# Patient Record
Sex: Female | Born: 1935 | Race: Black or African American | Hispanic: No | Smoking: Current every day smoker
Health system: Southern US, Community
[De-identification: ages and names within clinical notes are randomized; demographics above are authoritative.]

## PROBLEM LIST (undated history)

## (undated) DIAGNOSIS — N182 Chronic kidney disease, stage 2 (mild): Secondary | ICD-10-CM

## (undated) DIAGNOSIS — Z9889 Other specified postprocedural states: Secondary | ICD-10-CM

## (undated) DIAGNOSIS — E041 Nontoxic single thyroid nodule: Secondary | ICD-10-CM

## (undated) DIAGNOSIS — Z720 Tobacco use: Secondary | ICD-10-CM

## (undated) DIAGNOSIS — M1990 Unspecified osteoarthritis, unspecified site: Secondary | ICD-10-CM

## (undated) DIAGNOSIS — R911 Solitary pulmonary nodule: Secondary | ICD-10-CM

## (undated) DIAGNOSIS — I471 Supraventricular tachycardia, unspecified: Secondary | ICD-10-CM

## (undated) DIAGNOSIS — K219 Gastro-esophageal reflux disease without esophagitis: Secondary | ICD-10-CM

## (undated) DIAGNOSIS — Z950 Presence of cardiac pacemaker: Secondary | ICD-10-CM

## (undated) DIAGNOSIS — E785 Hyperlipidemia, unspecified: Secondary | ICD-10-CM

## (undated) DIAGNOSIS — M359 Systemic involvement of connective tissue, unspecified: Secondary | ICD-10-CM

## (undated) DIAGNOSIS — I119 Hypertensive heart disease without heart failure: Secondary | ICD-10-CM

## (undated) DIAGNOSIS — M069 Rheumatoid arthritis, unspecified: Secondary | ICD-10-CM

## (undated) DIAGNOSIS — J449 Chronic obstructive pulmonary disease, unspecified: Secondary | ICD-10-CM

## (undated) DIAGNOSIS — I5042 Chronic combined systolic (congestive) and diastolic (congestive) heart failure: Secondary | ICD-10-CM

## (undated) DIAGNOSIS — I10 Essential (primary) hypertension: Secondary | ICD-10-CM

## (undated) DIAGNOSIS — F419 Anxiety disorder, unspecified: Secondary | ICD-10-CM

## (undated) DIAGNOSIS — E790 Hyperuricemia without signs of inflammatory arthritis and tophaceous disease: Secondary | ICD-10-CM

## (undated) HISTORY — PX: TUBAL LIGATION: SHX77

## (undated) HISTORY — DX: Chronic obstructive pulmonary disease, unspecified: J44.9

## (undated) HISTORY — DX: Chronic combined systolic (congestive) and diastolic (congestive) heart failure: I50.42

## (undated) HISTORY — DX: Tobacco use: Z72.0

## (undated) HISTORY — DX: Hypertensive heart disease without heart failure: I11.9

## (undated) HISTORY — DX: Unspecified osteoarthritis, unspecified site: M19.90

## (undated) HISTORY — DX: Supraventricular tachycardia, unspecified: I47.10

## (undated) HISTORY — DX: Anxiety disorder, unspecified: F41.9

## (undated) HISTORY — DX: Hyperuricemia without signs of inflammatory arthritis and tophaceous disease: E79.0

## (undated) HISTORY — DX: Gastro-esophageal reflux disease without esophagitis: K21.9

## (undated) HISTORY — PX: OTHER SURGICAL HISTORY: SHX169

## (undated) HISTORY — DX: Hyperlipidemia, unspecified: E78.5

## (undated) HISTORY — DX: Solitary pulmonary nodule: R91.1

## (undated) HISTORY — DX: Supraventricular tachycardia: I47.1

## (undated) HISTORY — DX: Other specified postprocedural states: Z98.890

## (undated) HISTORY — DX: Rheumatoid arthritis, unspecified: M06.9

## (undated) HISTORY — DX: Chronic kidney disease, stage 2 (mild): N18.2

## (undated) HISTORY — DX: Nontoxic single thyroid nodule: E04.1

## (undated) HISTORY — DX: Essential (primary) hypertension: I10

---

## 2004-07-30 ENCOUNTER — Emergency Department: Payer: Self-pay | Admitting: Emergency Medicine

## 2004-11-10 ENCOUNTER — Emergency Department: Payer: Self-pay | Admitting: Emergency Medicine

## 2004-11-10 ENCOUNTER — Other Ambulatory Visit: Payer: Self-pay

## 2005-04-10 ENCOUNTER — Emergency Department: Payer: Self-pay | Admitting: Emergency Medicine

## 2005-08-20 ENCOUNTER — Emergency Department: Payer: Self-pay | Admitting: Internal Medicine

## 2005-10-02 ENCOUNTER — Emergency Department: Payer: Self-pay | Admitting: Emergency Medicine

## 2005-10-02 ENCOUNTER — Other Ambulatory Visit: Payer: Self-pay

## 2006-03-12 ENCOUNTER — Emergency Department: Payer: Self-pay | Admitting: Emergency Medicine

## 2006-03-24 ENCOUNTER — Emergency Department: Payer: Self-pay | Admitting: Emergency Medicine

## 2006-09-22 HISTORY — PX: CARDIAC CATHETERIZATION: SHX172

## 2006-10-06 ENCOUNTER — Inpatient Hospital Stay: Payer: Self-pay | Admitting: Internal Medicine

## 2006-10-06 ENCOUNTER — Other Ambulatory Visit: Payer: Self-pay

## 2006-10-06 ENCOUNTER — Ambulatory Visit: Payer: Self-pay | Admitting: Cardiology

## 2006-10-16 ENCOUNTER — Ambulatory Visit: Payer: Self-pay | Admitting: Internal Medicine

## 2006-11-11 ENCOUNTER — Ambulatory Visit: Payer: Self-pay | Admitting: Internal Medicine

## 2006-12-02 ENCOUNTER — Ambulatory Visit: Payer: Self-pay | Admitting: Gastroenterology

## 2007-01-19 ENCOUNTER — Ambulatory Visit: Payer: Self-pay | Admitting: Internal Medicine

## 2007-03-05 ENCOUNTER — Emergency Department: Payer: Self-pay

## 2007-04-07 ENCOUNTER — Ambulatory Visit: Payer: Self-pay

## 2007-04-07 ENCOUNTER — Encounter: Payer: Self-pay | Admitting: Internal Medicine

## 2007-04-12 ENCOUNTER — Ambulatory Visit: Payer: Self-pay | Admitting: Internal Medicine

## 2007-04-22 ENCOUNTER — Ambulatory Visit: Payer: Self-pay | Admitting: Internal Medicine

## 2007-06-17 ENCOUNTER — Ambulatory Visit: Payer: Self-pay | Admitting: Internal Medicine

## 2007-08-10 ENCOUNTER — Emergency Department: Payer: Self-pay | Admitting: Emergency Medicine

## 2007-10-25 ENCOUNTER — Emergency Department: Payer: Self-pay | Admitting: Emergency Medicine

## 2007-11-01 ENCOUNTER — Ambulatory Visit: Payer: Self-pay | Admitting: Internal Medicine

## 2007-12-02 ENCOUNTER — Ambulatory Visit: Payer: Self-pay | Admitting: Internal Medicine

## 2007-12-20 ENCOUNTER — Ambulatory Visit: Payer: Self-pay | Admitting: Internal Medicine

## 2007-12-20 LAB — CONVERTED CEMR LAB
BUN: 16 mg/dL (ref 6–23)
Potassium: 3.3 meq/L — ABNORMAL LOW (ref 3.5–5.3)
Sodium: 146 meq/L — ABNORMAL HIGH (ref 135–145)

## 2007-12-24 ENCOUNTER — Ambulatory Visit: Payer: Self-pay | Admitting: Internal Medicine

## 2008-02-01 ENCOUNTER — Emergency Department: Payer: Self-pay | Admitting: Internal Medicine

## 2008-08-28 ENCOUNTER — Emergency Department: Payer: Self-pay | Admitting: Emergency Medicine

## 2008-12-13 ENCOUNTER — Emergency Department: Payer: Self-pay | Admitting: Emergency Medicine

## 2009-03-28 ENCOUNTER — Emergency Department: Payer: Self-pay | Admitting: Emergency Medicine

## 2009-08-27 ENCOUNTER — Emergency Department: Payer: Self-pay | Admitting: Emergency Medicine

## 2009-09-18 ENCOUNTER — Inpatient Hospital Stay: Payer: Self-pay | Admitting: Vascular Surgery

## 2009-12-07 ENCOUNTER — Emergency Department: Payer: Self-pay | Admitting: Emergency Medicine

## 2010-01-20 ENCOUNTER — Emergency Department: Payer: Self-pay | Admitting: Emergency Medicine

## 2010-02-15 ENCOUNTER — Emergency Department: Payer: Self-pay | Admitting: Emergency Medicine

## 2010-04-14 ENCOUNTER — Emergency Department: Payer: Self-pay | Admitting: Unknown Physician Specialty

## 2010-06-20 ENCOUNTER — Ambulatory Visit: Payer: Self-pay | Admitting: Internal Medicine

## 2010-06-20 DIAGNOSIS — F17200 Nicotine dependence, unspecified, uncomplicated: Secondary | ICD-10-CM | POA: Insufficient documentation

## 2010-06-20 DIAGNOSIS — R000 Tachycardia, unspecified: Secondary | ICD-10-CM | POA: Insufficient documentation

## 2010-06-21 ENCOUNTER — Encounter: Payer: Self-pay | Admitting: Internal Medicine

## 2010-06-25 ENCOUNTER — Encounter: Payer: Self-pay | Admitting: Internal Medicine

## 2010-06-25 ENCOUNTER — Ambulatory Visit: Payer: Self-pay

## 2010-10-22 NOTE — Assessment & Plan Note (Signed)
Summary: ROV/AMD  Medications Added ALPRAZOLAM 0.25 MG TABS (ALPRAZOLAM) one tablet three times a day as needed DILTIAZEM HCL 120 MG TABS (DILTIAZEM HCL) one tablet daily FOLIC ACID 1 MG TABS (FOLIC ACID) one tablet once daily HYDROCHLOROTHIAZIDE 25 MG TABS (HYDROCHLOROTHIAZIDE) one tablet once daily METHOTREXATE 2.5 MG TABS (METHOTREXATE SODIUM) one tablet qd PREDNISONE 5 MG/5ML SOLN (PREDNISONE) once daily      Allergies Added: NKDA  Visit Type:  Initial Consult Primary Provider:  Charlotta Newton.  CC:  c/o irreg. heart beats and shortness of breath.  Denies chest pain.Marland Kitchen  History of Present Illness: Ms. [name] [name] is a very pleasant 75 year old woman with a history of chest pain and dyspnea with normal coronary arteries by catheterization in 2008.  She also has hypertension, hyperlipidemia, COPD with ongoing tobacco use, rheumatoir arthritis, borderline diabetes and gastroesophageal reflux disease.   We have not seen her in 2 years. Referred back for evaluation of irregular heartbeat and SOB.   In May went to [facility] with palpitations. Told her heart was skipping. ECG showed sinus tach. Started on diltiiazem. Says she doesn't notice palpitations. However, went to PCP a couple of weeks ago and was told her heartbeart was irregular and referred here. Had blood work but she doesn't know results or what they checked.  Continues to smoke 1/2 ppd. Remains fairly active cutting grass and doing other activities. Has occasional CP usually after eating. Not exertional. Stays SOB. No syncope or presyncope. No HF. No known h/o a. fib. No fevers or chills.     Problems Prior to Update: None  Medications Prior to Update: 1)  None  Current Medications (verified): 1)  Alprazolam 0.25 Mg Tabs (Alprazolam) .... One Tablet Three Times A Day As Needed 2)  Diltiazem Hcl 120 Mg Tabs (Diltiazem Hcl) .... One Tablet Daily 3)  Folic Acid 1 Mg Tabs (Folic Acid) .... One Tablet Once Daily 4)   Hydrochlorothiazide 25 Mg Tabs (Hydrochlorothiazide) .... One Tablet Once Daily 5)  Methotrexate 2.5 Mg Tabs (Methotrexate Sodium) .... One Tablet Qd 6)  Prednisone 5 Mg/67ml Soln (Prednisone) .... Once Daily  Allergies (verified): No Known Drug Allergies   Past History:  Past Medical History: Last updated: 08/22/2009 Hypertension Hyperlipidemia COPD with ongoing tobacco use borderline DM GERD  Family History: Last updated: 08/22/2009 +hypertension +DM no history of early CAD and CVA  Social History: Last updated: 06/20/2010 Widowed Tobacco Use - Yes.  Drugs/ETOH - no  Risk Factors: Smoking Status: current (08/22/2009)  Family History: Reviewed history from 08/22/2009 and no changes required. +hypertension +DM no history of early CAD and CVA  Social History: Reviewed history from 08/22/2009 and no changes required. Widowed Tobacco Use - Yes.  Drugs/ETOH - no  Review of Systems       As per HPI and past medical history; otherwise all systems negative.   Vital Signs:  Patient profile:   75 year old female Height:      66 inches Weight:      181 pounds BMI:     29.32 Pulse rate:   104 / minute BP sitting:   128 / 80  (left arm) Cuff size:   large  Vitals Entered By: Bishop Dublin, CMA (June 20, 2010 4:31 PM)  Physical Exam  General:  Well appearing. no resp difficulty HEENT: normal Neck: supple. no JVD. Carotids 2+ bilat; no bruits. No lymphadenopathy or thryomegaly appreciated. Cor: PMI nondisplaced. Tachycardic irregular. No rubs, gallops, murmur. Lungs: clear with decreased air movement throughout Abdomen:  soft, nontender, nondistended. No hepatosplenomegaly. No bruits or masses. Good bowel sounds. Extremities: no cyanosis, clubbing, rash, edema Neuro: alert & orientedx3, cranial nerves grossly intact. moves all 4 extremities w/o difficulty. affect pleasant    Impression & Recommendations:  Problem # 1:  TACHYCARDIA (ICD-785) She has  persistent sinus tach of unlcear etiology. Also has frequent PVCs. Will need echo to assess LV and RV function as well as to evlauate for PAH. Also check CBC and thyroid panel. Continue diltiazem.   Problem # 2:  TOBACCO ABUSE (ICD-305.1) Counseled on need to quit.   Other Orders: Echocardiogram (Echo) T-Comprehensive Metabolic Panel [phone]) T-CBC w/Diff [phone]) T-T4, Free [id number]) T-TSH [phone])  Patient Instructions: 1)  Your physician recommends that you return for lab work in:(CBC/CMET/TSH/T4FREE)  2)  Your physician wants you to follow-up in:   3 MONTHS You will receive a reminder letter in the mail two months in advance. If you don't receive a letter, please call our office to schedule the follow-up appointment. 3)  Your physician has requested that you have an echocardiogram.  Echocardiography is a painless test that uses sound waves to create images of your heart. It provides your doctor with information about the size and shape of your heart and how well your heart's chambers and valves are working.  This procedure takes approximately one hour. There are no restrictions for this procedure.

## 2011-02-04 NOTE — Assessment & Plan Note (Signed)
[facility] OFFICE NOTE   NAME:TROLLINGERKyasia, Steuck                    MRN:          [id number]  DATE:12/02/2007                            DOB:          [birth year]    PRIMARY CARE PHYSICIAN:  Dr. Darliss Cheney at [facility] in  [place].   INTERVAL HISTORY:  Ms. Dishner is a very pleasant 75 year old with  history of chest pain and dyspnea with normal coronary arteries by  catheterization 2008.  She also has hypertension, hyperlipidemia, COPD  with ongoing tobacco use, borderline diabetes and gastroesophageal  reflux disease.   She returns today for routine follow-up.  She is doing okay.  Denies any  chest pain or shortness of breath.  No lower extremity edema.  She  continues to smoke but is trying to quit.  She says she has been  following a blood pressure systolics that run around the 140 range.   CURRENT MEDICATIONS:  1. Aspirin 81 a day.  2. Omeprazole 20 a day.  3. Coreg 6.25 b.i.d.  4. Norvasc 10 a day.   PHYSICAL EXAMINATION:  GENERAL:  Well-appearing, no acute distress.  Ambulates around the clinic without respiratory difficulty.  VITAL SIGNS:  Blood pressure is 135/75, heart rate 82, weight 189.  HEENT:  Normal.  NECK:  Supple.  No JVD, carotid 2+ bilateral bruits.  There is no  lymphadenopathy or thyromegaly.  CARDIAC:  PMI is nondisplaced is regular rate and rhythm.  No murmurs.  There is an S4.  LUNGS:  Clear.  ABDOMEN:  Soft, nontender, nondistended.  No hepatosplenomegaly.  No  bruits, no masses.  Good bowel sounds.  EXTREMITIES:  Warm.  No cyanosis, clubbing or edema.  No rash.  NEUROLOGICAL:  She is alert and x3.  Cranial nerves II-XII intact.  Moves all four extremities without difficulty.  Affect is pleasant.   ASSESSMENT/PLAN:  1. Hypertension.  Blood pressure remains elevated.  Given her      diabetes, I think it is reasonable to try and reinstitute an ACE  inhibitor.  She had problems with the lisinopril, HCTZ combination      in the past.  We will try to use just lisinopril 10 mg a day.  Will      get a B-met in one week.  2. Tobacco use.  I counseled her on the need to quit smoking.  She is      interested in trying again.  She has failed Chantix in the past.      We will try her on Zyban.   DISPOSITION:  Return to clinic in 3 months routine follow-up.     [name], MD  Electronically Signed    DRB/MedQ  DD: 12/02/2007  DT: 12/03/2007  Job #: [id number]

## 2011-02-04 NOTE — Assessment & Plan Note (Signed)
[facility] OFFICE NOTE   NAME:[name] [name], [name] [name]                    MRN:          [id number]  DATE:12/24/2007                            DOB:          [birth year]    PRIMARY CARE PHYSICIAN:  Dr. Darliss Cheney at [facility] in  [place].   HISTORY:  Ms. Heldman is a very pleasant 75 year old woman with a  history of chest pain and dyspnea with normal coronary arteries by  catheterization in 2008.  She also has hypertension, hyperlipidemia,  COPD with ongoing tobacco use, borderline diabetes and gastroesophageal  reflux disease.   She returns today for routine follow-up.  She is feeling quite  miserable.  She has a viral infection and has had a very severe cough.  Her eyes have been watering and she has been congested.  When we last  saw her we increased her Norvasc from 5 mg a day to 10 mg a day, but at  the same time she stopped her lisinopril and her Coreg.  She denies any  chest pain or shortness of breath.   CURRENT MEDICATIONS:  1. Aspirin 81 mg a day.  2. Colace.  3. Prilosec 20 mg a day.  4. Potassium 10 mEq a day.   MEDICATION INTOLERANCES:  She stopped taking lisinopril and  hydrochlorothiazide, it made her feel lightheaded.  She refused to take  it.  We actually ended up putting her just back on lisinopril but she  stopped this as well.   PHYSICAL EXAM:  She is feeling miserable.  She has watery eyes and is  congested.  She feels nauseated.  Blood pressure is 132/80, heart rate 70, weight is 186.  HEENT:  Notable for watery eyes, which are injected, and sinus  congestion.  NECK:  Supple.  No JVD.  Carotids 2+ bilaterally without bruits.  There  is no lymphadenopathy or thyromegaly.  CARDIAC:  PMI is nondisplaced.  She has a regular rate and rhythm.  No  murmurs.  There is an S4.  LUNGS:  Clear.  ABDOMEN:  Soft, nontender, nondistended.  No hepatosplenomegaly, no  bruits, no masses.   Good bowel sounds.  EXTREMITIES:  Warm with no cyanosis, clubbing or edema.  No rash.  NEUROLOGIC:  Alert and oriented x3.  Cranial nerves II-XII are intact.  Moves all four extremities without difficulty.  Affect is pleasant.   ASSESSMENT/PLAN:  1. Hypertension.  Blood pressure is not too bad today.  We will ask      her to restart her Coreg 6.25 mg b.i.d.  Ideally, given her      borderline diabetes, she would benefit from an ACE inhibitor but      she has had trouble tolerating this in the past.  Given her cough      right now, I do not think we should push it.  We will see what the      Coreg does.  2. Cough.  I think she has a viral sinusitis or at least upper      respiratory tract infection.  We have given her  some codeine for 7      days to help her sleep and also started her on Tessalon Perles.      She is to follow up with her primary care doctor if symptoms      continue.  3. Tobacco use.  I reminded her once again of the need to stop      smoking.     [name], MD  Electronically Signed    DRB/MedQ  DD: 12/24/2007  DT: 12/24/2007  Job #: [id number]

## 2011-02-04 NOTE — Assessment & Plan Note (Signed)
[name] OFFICE NOTE   NAME:Hallum, Safina                    MRN:          [id number]  DATE:11/01/2007                            DOB:          09-Dec-1935    PRIMARY CARE PHYSICIAN:  Dr. Darliss Cheney at the [facility] in  [place]   INTERVAL HISTORY:  Ms. Hegeman is a very pleasant 75 year old woman  with a history of chest pain and dyspnea with normal coronary arteries  by catheterization 2008.  She also has hypertension, hyperlipidemia,  COPD with ongoing tobacco use, borderline diabetes and gastroesophageal  reflux disease.   She returns today for routine followup.  She said she had a tough week.  Her husband is a paranoid schizophrenic with multiple medical problems  and she had a challenging time with him.  She has been having some  shortness of breath and chest tightness and she felt her blood pressure  was up.  She went to the emergency room and blood pressure was 170/95.  Unfortunately, she continues to smoke one-pack a day.  She has been  compliant with her medications.   CURRENT MEDICATIONS:  1. Aspirin 81 a day.  2. Prilosec 20 a day.  3. Coreg 6.25 b.i.d.  4. Amlodipine 5 mg a day.   SHE STOPPED TAKING LISINOPRIL, HCTZ AS SHE SAID IT MADE HER FEEL  LIGHTHEADED AND SICK AND REFUSES TO TAKE IT ANYMORE.   PHYSICAL EXAM:  She is well-appearing in no acute distress.  Ambulates  around the clinic without any respiratory difficulty.  Blood pressure is  150/90, heart rate 64, weight 189, which is stable.  HEENT is normal.  NECK: Is supple.  There is no JVD.  Carotids are 2+ bilaterally without  bruits.  There is no lymphadenopathy or thyromegaly.  CARDIAC:  PMI is nondisplaced.  She has a regular rate and rhythm.  No  murmurs. There is an S4.  LUNGS:  Clear.  ABDOMEN:  Soft, nontender, nondistended.  No hepatosplenomegaly.  No  bruits, no masses.  Good bowel sounds.  EXTREMITIES:  Warm with no cyanosis, clubbing or edema.  No rash.  NEURO:  She is alert and oriented x3.  Cranial nerves II-XII intact.  Moves all four extremities without difficulty.  Affect is pleasant.   ASSESSMENT/PLAN:  1. Hypertension.  Blood pressure remains elevated.  We will increase      her Norvasc 10 mg a day and see how she does.  Can consider      titrating her Coreg at next visit or adding spironolactone.   DISPOSITION:  Will see her back in 1-2 months for routine follow-up.     [name], MD  Electronically Signed    DRB/MedQ  DD: 11/01/2007  DT: 11/01/2007  Job #: [id number]   cc:   Dr.  Darliss Cheney

## 2011-02-04 NOTE — Assessment & Plan Note (Signed)
[name] OFFICE NOTE   NAME:[name], [name]                    MRN:          [id number]  DATE:06/17/2007                            DOB:          20-Aug-1936    PRIMARY CARE PHYSICIAN:  [facility] in [place]   INTERVAL HISTORY:  [name] [name] is a very pleasant 75 year old woman  with a history of chest pain and dyspnea with normal coronary arteries  by catheterization this year. She also has a history of hypertension,  hyperlipidemia and chronic obstructive pulmonary disease with ongoing  tobacco abuse, borderline diabetes and gastroesophageal reflux disease  as well as frequent PVCs.   She returns today for routine followup. She was unfortunately able to  get her Holter monitor, but she says that her palpitations have really  resolved. She did get her PFTs but we were unable to get the results of  these. She says overall she is doing much better. She denies any  significant chest pain. No shortness of breath. She is able to do all of  her activities without any problems. She continues to smoke about a pack  a day. She says she is under a great deal of stress at home. Her husband  is schizophrenic and appears also to have Alzheimer's disease. She  stopped her lisinopril/hydrochlorothiazide as she said it was not making  her feel very well.   CURRENT MEDICATIONS:  1. Aspirin 81 a day.  2. Prilosec 20 a day.  3. Coreg 6.25 b.i.d.   PHYSICAL EXAMINATION:  She is well-appearing in no acute distress.  Ambulates around the clinic without any respiratory difficulty.  VITAL SIGNS: Blood pressure of 150/90, heart rate 64, weight 189.  HEENT: Is normal.  NECK: is supple. No JVD. Carotids are 2+ bilaterally without bruits.  There is no lymphadenopathy, thyromegaly.  CARDIAC: PMI is nondisplaced. She has a regular rate and a rhythm. No  murmurs. There is an S4.  LUNGS:  Are clear.  ABDOMEN:  Soft, nontender, nondistended. No hepatosplenomegaly. No  bruits. No masses. Good bowel sounds.  EXTREMITIES: Are warm with no cyanosis, clubbing or edema. No rash.  NEURO: She is alert and oriented x3. Cranial nerves II-XII are intact.  Moves all 4 extremities without difficulty. Affect is pleasant.   ASSESSMENT/PLAN:  1. Hypertension, blood pressure remains elevated. She was intolerant      of LISINOPRIL/HYDROCHLOROTHIAZIDE DUE TO FATIGUE. We will try her      on Norvasc 5 and see how she does.  2. Dyspnea. This is essentially resolved. We will check PFTs. I did      once again remind her to stop smoking.  3. PVCs. These are much improved. At this point will hold off on      getting her Holter monitor unless these recur. She does have normal      left ventricular function which makes these less threatening.   DISPOSITION:  She will return to clinic in four months for routine  followup.     [name] Buckles. Bensimhon, MD  Electronically Signed    DRB/MedQ  DD:  06/17/2007  DT: 06/17/2007  Job #: [id number]   cc:   [facility]

## 2011-02-04 NOTE — Assessment & Plan Note (Signed)
[facility] OFFICE NOTE   NAME:[name], [name]                    MRN:          [id number]  DATE:04/12/2007                            DOB:          [birth year]    PRIMARY CARE PHYSICIAN:  [facility] in [place].   INTERVAL HISTORY:  [name] [name] is a very pleasant 75 year old woman  with history of chest pain and dyspnea with normal coronary arteries by  catheterization earlier this year.  She also has a history of  hypertension, hyperlipidemia, COPD with ongoing tobacco use, borderline  diabetes, and gastroesophageal reflux disease.   She returns today for routine followup.  Overall, she says she is not  doing so well.  She notes that she is short of breath with activity,  particularly in the heat, and it is hard for her to do her activities  like mowing the lawn.  She denies any chest pain with this.  She also  says that she is somewhat occasional short of breath at night and sleeps  on more pillows, though she denies lower extremity edema or PND.  Unfortunately, she continues to smoke 1/3 of a pack-per-day  of  cigarettes.  She says at home she is under a great deal of stress, as  her husband is schizophrenic, and there are multiple other issues, and  she suspects this may be contributing to her symptoms.   CURRENT MEDICATIONS:  1. Aspirin 81 mg daily.  2. Prilosec 20 daily.  3. Coreg 6.25 b.i.d.  4. Potassium 10 daily.  5. Meloxicam 7.5 b.i.d. p.r.n.   PHYSICAL EXAM:  GENERAL:  She is well-appearing, in no acute distress,  ambulates around the clinic without any respiratory difficulty.  VITAL SIGNS:  Blood pressure is 116/78.  Heart rate is 75.  Weight is  189.  HEENT:  Normal.  NECK:  Supple.  No JVD.  Carotids are 2+ bilaterally without any bruits.  There is no lymphadenopathy or thyromegaly.  CARDIAC:  PMI is nondisplaced.  She has a regular rate and rhythm with  murmur.  There is  an S4.  LUNGS:  Clear.  ABDOMEN:  Soft, nontender, nondistended.  No hepatosplenomegaly, no  bruits, no masses.  Good bowel sounds.  EXTREMITIES:  Warm with no cyanosis, clubbing, or edema.  No rash.  NEUROLOGIC:  She is alert and oriented x3.  Cranial nerves 2-12 are  intact.  She moves all 4 extremities without difficulty.  Affect is  pleasant.   ASSESSMENT/PLAN:  1. Dyspnea.  Etiology of this is unclear.  Given her hypertension, she      may have a component of diastolic dysfunction, but I do not see any      evidence of fluid overload whatsoever.  The patient had an      echocardiogram a few days ago which showed an ejection fraction of      55% with just very mild mitral regurgitation.  There was no mention      of diastolic dysfunction.  At this point, we will go ahead and get      the  pulmonary function tests to further evaluate.  I have asked her      to communicate with her primary care physician, as I think stress      may, in fact, be contributing to some of her symptoms as well as      deconditioning.  2. Premature ventricular contractions.  We will put a 48 hour Holter      monitor on her to further evaluate these, but I doubt these are      contributing to her symptoms.  She did have a normal thyroid      performed.  3. Hypertension, well controlled.  4. Tobacco use, ongoing.  I once again reminded her of the need to      quit smoking.   DISPOSITION:  We will see her back in clinic in 3 months.  Should her  Holter monitor be normal, I suspect we than then just follow her up on a  p.r.n. basis.     [name] Buckles. Bensimhon, MD  Electronically Signed    DRB/MedQ  DD: 04/12/2007  DT: 04/12/2007  Job #: [id number]   cc:   [name] [name], [place], [place]

## 2011-02-07 NOTE — Assessment & Plan Note (Signed)
[facility] OFFICE NOTE   NAME:Rathe, Raejean                    MRN:          [id number]  DATE:11/11/2006                            DOB:          August 25, 1936    PRIMARY CARE PHYSICIAN:  [facility] in [place].  Previously was Dr. Lesli Albee but she has left the practice.   PATIENT IDENTIFICATION:  Ms. [name] [name] is a very pleasant 75 year old  woman who returns today for routine followup.   PROBLEM LIST:  1. Admission for chest pain in January 2008.      a.     Cardiac catheterization showed normal coronary arteries and       ejection fraction of 63%.      b.     Chest CT showed no evidence of pulmonary embolus.  2. Hypertension:  Renal arteries normal on catheterization.  3. Hyperlipidemia.  4. Chronic obstructive pulmonary disease with ongoing tobacco use.  5. Glucose intolerance, hemoglobin A1c of 6.1.  6. Gastroesophageal reflux disease and hiatal hernia.   CURRENT MEDICATIONS:  1. Aspirin 81.  2. Colace 100 b.i.d.  3. Prilosec 20 a day.  4. Lisinopril/hydrochlorothiazide 20/12.5 a day.   INTERVAL HISTORY:  Ms. Rhett returns today for routine followup.  Overall, she is doing pretty well.  She denies any chest pain or  shortness of breath.  Unfortunately, she is still smoking a half a pack  of cigarettes a day.  She does note that food is getting stuck in her  throat and finally passing down, and then she has a lot of problems with  reflux.  She also feels achy all over her bones.  She has not had fevers  or chills.  No rashes.  She has occasional palpitations but no syncope  or presyncope.   PHYSICAL EXAMINATION:  GENERAL:  She is well-appearing, somewhat  fatigued, but no acute distress.  Ambulates around the clinic without  any respiratory difficulty.  VITAL SIGNS:  Blood pressure is 122/70 with a heart rate of 100.  Her  weight is 188.  HEENT:  Sclerae anicteric, EOMI.   There is no xanthelasmas.  Mucous  membranes are moist.  NECK:  Supple, no JVD.  Carotids are 2+ bilaterally without any bruits.  There is no lymphadenopathy or thyromegaly.  CARDIAC:  Regular rate and rhythm.  No murmurs or rubs.  There is a soft  S4.  LUNGS:  Clear.  ABDOMEN:  Soft, nontender, nondistended.  No hepatosplenomegaly, no  bruits, no masses, good bowel sounds.  EXTREMITIES:  Warm with no cyanosis, clubbing or edema.  No rashes.  NEUROLOGIC:  She is alert and oriented x3.  Cranial nerves II-XII are  intact.  Moves all four extremities without difficulty.  Affect is  appropriate.   EKG shows sinus tachycardia with frequent PVCs and an incomplete right  bundle-branch block.  There is also LVH.  Of note, her ectopy did quiet  down later on in the office visit.   ASSESSMENT AND PLAN:  1. Hypertension.  This is well controlled.  2. Frequent premature ventricular contractions.  This  is likely      benign, given her normal LV function.  However, we will check a TSH      and electrolytes.  Start her on metoprolol ER 50 mg a day.  Also      get a 2-D echocardiogram.  3. Glucose intolerance.  I have asked her to follow up with her      primary care doctor in [facility] for closer monitoring of her      sugars.  4. Reflux disease and dysphagia.  I suspect she may have an esophageal      stricture.  I suggested she increase her Prilosec and we have also      referred her to GI.   DISPOSITION:  We will see her back in 2 months for routine followup.     [name], MD  Electronically Signed    DRB/MedQ  DD: 11/11/2006  DT: 11/11/2006  Job #: [id number]   cc:   [facility] in [place]

## 2011-02-07 NOTE — Assessment & Plan Note (Signed)
[name] [name] OFFICE [name]   NAME:[name], [name]                    MRN:          [id number]  DATE:10/16/2006                            DOB:          12/12/1935    PRIMARY CARE PHYSICIAN:  Provided by [facility] in  [place].   PATIENT IDENTIFICATION:  [name] [name] is a very pleasant 75 year old  woman with multiple cardiac risk factors who presents for a post  catheterization followup.   PROBLEM LIST:  1. Admission to [facility] for chest pain with T wave      inversions in January 2008.      a.     Cardiac catheterization showed normal coronary arteries with       ejection fraction of 63%.      b.     Chest CT showed no evidence of pulmonary embolus.  2. Hypertension.      a.     Renal arteries normal on catheterization.  3. Hyperlipidemia.  4. Chronic obstructive pulmonary disease with ongoing tobacco use.  5. Glucose intolerance.   CURRENT MEDICATIONS:  1. Aspirin 81 mg a day.  2. Atenolol 50 mg b.i.d.   ALLERGIES:  SULFA.   INTERVAL HISTORY:  [name] [name] returns today for a routine post  hospital followup after her catheterization.  She was recently admitted  with chest pain and had some T wave inversions.  D-dimer is also  elevated.  She had a CT of the chest which was negative for pulmonary  embolus and a cath which showed normal coronary arteries and normal LV  function.  She returns today saying she has had no further chest pain.  She feels well.  Unfortunately, she continues to smoke almost a pack a  day.   PHYSICAL EXAMINATION:  GENERAL:  She is well-appearing in no acute  distress. Ambulates around the clinic without any respiratory  difficulty.  VITAL SIGNS:  Blood pressure is 140/70, pulse is 65, weight is 194.  HEENT:  Sclerae anicteric.  EOMI.  There are a few scattered  xanthelasma.  Mucous membranes are moist.  Oropharynx is clear.  NECK:  Supple.  No  JVD.  Carotids are 2+ bilaterally without any bruits.  There is no lymphadenopathy or thyromegaly.  CARDIAC:  She has a regular rate and rhythm with a soft S4.  No murmur.  LUNGS:  Clear.  ABDOMEN:  Soft, nontender, nondistended.  No hepatosplenomegaly.  No  bruits.  No masses appreciated.  EXTREMITIES:  Warm with no cyanosis, clubbing, or edema.  Distal pulses  are 1+ bilaterally.  GROIN:  There is no hematoma or bruit.  NEUROLOGIC:  She is alert and oriented x3.  Cranial nerves II-XII are  intact.  Moves all four extremities without difficulty.  Affect is  appropriate.   An EKG showed a normal sinus rhythm at a rate of 65.  There is LVH with  repolarization abnormalities as well as a left anterior fascicular  block.  She persists with inferolateral T wave inversions which are  unchanged from the hospital.   ASSESSMENT/PLAN:  1. Chest  pain.  This is resolved.  Given the results of her testing, I      doubt this is ischemic.  We will continue current therapy.  2. Hypertension.  Blood pressure is still poorly controlled.  I am not      a big fan of using atenolol as a first line agent for hypertension      and thus I think we probably should switch this.  Given her glucose      intolerance, she may benefit from an ACE inhibitor.  We will go      ahead and check a urinary microalbumin as well as a C-MET, lipids,      and a hemoglobin A1c.  We will make choice on which agent to use      based on the results of that testing.  I am leaning toward      Lisinopril 20 with HCTZ 12.5.  3. Hyperlipidemia.  We will recheck her lipids this week and she may      need a Statin.  4. Tobacco use.  I had a long talk with her about the need to quit      smoking and she is very interested in doing this but not sure if      she can do so. We have prescribed for her a Chantix starter pack.   DISPOSITION:  She will follow up with her primary care doctor but also  follow back here for further attention  to her blood pressure.     [name], MD  Electronically Signed    DRB/MedQ  DD: 10/16/2006  DT: 10/16/2006  Job #: [id number]   cc:   [facility]

## 2011-02-07 NOTE — Assessment & Plan Note (Signed)
[facility] OFFICE NOTE   NAME:[name], [name]                    MRN:          [id number]  DATE:01/19/2007                            DOB:          02-Jun-1936    PRIMARY CARE PHYSICIAN:  [facility] in [place].   INTERVAL HISTORY:  [name] [name] is a very pleasant 75 year old woman  with a history of chest pain and normal coronary arteries by  catheterization.  She also has a history of hypertension,  hyperlipidemia, COPD with ongoing tobacco use, borderline diabetes and  gastroesophageal reflux disease.  She returns today for routine followup  of her hypertension.  I last saw her 2 months ago, at which time her  blood pressure was under good control with 122/70.  Her EKG did reveal  some PVCs.  We ordered a thyroid panel, echocardiogram and started her  on metoprolol.  She did get her blood drawn, but unfortunately a thyroid  panel was not drawn.  She did not get her echocardiogram and did not  fill her prescription for metoprolol.  Today she says her main problem  is food getting stuck in her throat and having a lot of heartburn.  She  had a barium swallow but does not know the results.  She is scheduled  for an EGD today.  She also feels weak all over in her muscles.  She has  tried to see her primary care doctor but has not been able to get an  appointment.  She also feels occasional palpitations but denies syncope  or presyncope.  She has not had a sustained episode.   CURRENT MEDICATIONS:  1. Aspirin 81 a day.  2. Colace.  3. Prilosec 20 a day.  4. Lisinopril.  5. HCTZ 20/12.5.   PHYSICAL EXAM:  She is well-appearing, no acute distress.  Ambulates  around the clinic without any respiratory difficulty.  Blood pressure is  140/82, heart rate is 80, weight is 189.  HEENT:  Normal.  NECK:  Supple, no JVD, carotids are 2+ bilaterally without any bruits,  there is no lymphadenopathy or  thyromegaly.  CARDIAC:  Regular rate and rhythm, soft S4, no murmur.  LUNGS:  Clear.  ABDOMEN:  Soft, nontender, nondistended, no hepatosplenomegaly, no  bruits, no masses appreciated.  Good bowel sounds.  EXTREMITIES:  Warm with no cyanosis, clubbing or edema, no rash.  NEURO:  Alert and oriented x3, cranial nerves II-XII are intact, moves  all 4 extremities without difficulty.  Affect is pleasant.   ASSESSMENT AND PLAN:  1. Hypertension, suboptimally controlled.  We will add Coreg 6.25      twice daily for help with her blood pressure and also her premature      ventricular contractions.  See her back in 3 months.  2. Premature ventricular contractions.  Her heart rhythm sounds normal      today with no evidence of ectopy.  I rescheduled her for an echo as      well and started Coreg.  Will check thyroid panels next week,      though I suspect  they will be normal.  3. Muscle weakness.  I have asked her to follow up with her primary      care regarding this as well as her borderline diabetes.     [name], MD     DRB/MedQ  DD: 01/19/2007  DT: 01/19/2007  Job #: [id number]   cc:   [facility]

## 2011-03-03 ENCOUNTER — Encounter: Payer: Self-pay | Admitting: Cardiovascular Disease

## 2011-03-22 IMAGING — CT CT ABD-PELV W/ CM
1 of 2 series · 15 of 32 positions shown, 19 images · non-contrast
Comparison: none

REASON FOR EXAM: (1) severe epigastric pain; (2) pain IV CONTRAST ONLY
COMMENTS:

[Series 2: abdomen · axial · 0.88mm/px · z∈[-1116,-716]mm · 15 of 88 slices shown, 19 images]
[im 4/88  soft-tissue]
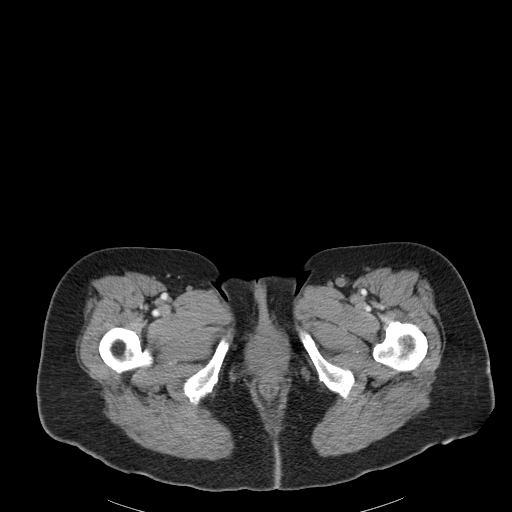
[im 4/88  bone]
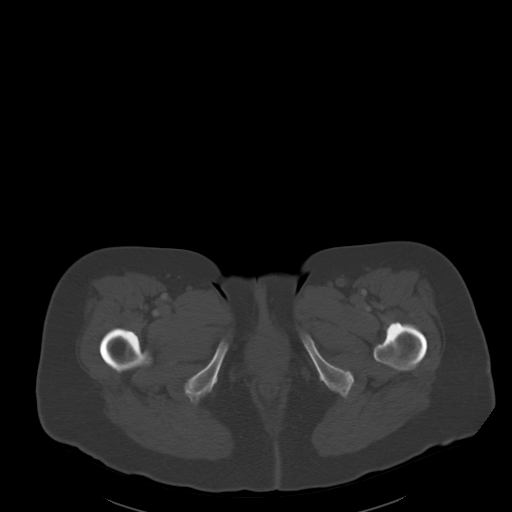
[im 12/88  soft-tissue]
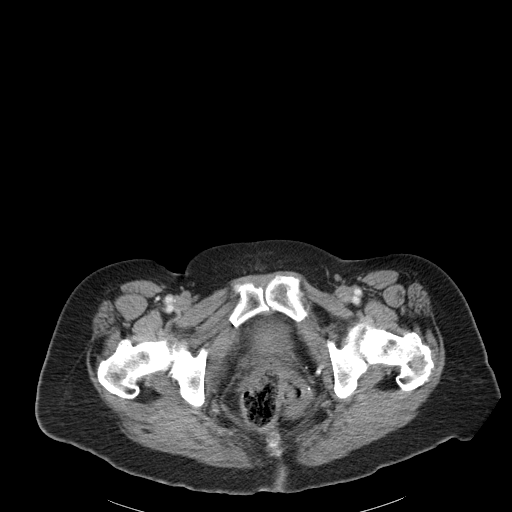
[im 19/88  soft-tissue]
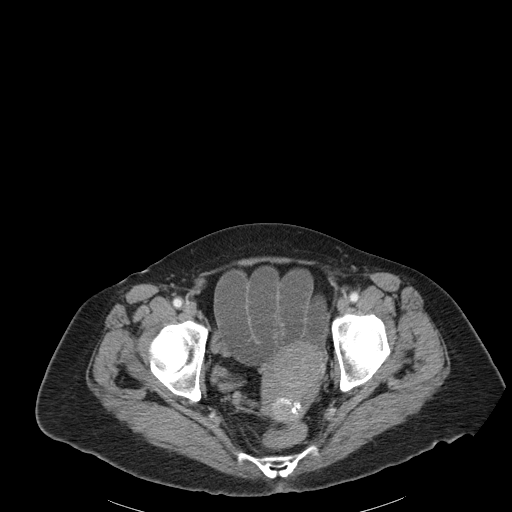
[im 23/88  soft-tissue]
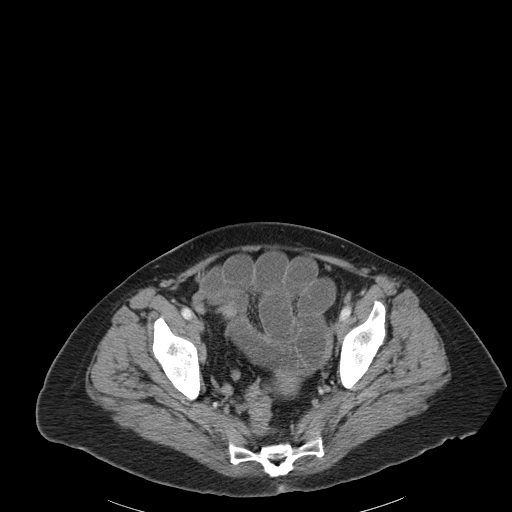
[im 31/88  soft-tissue]
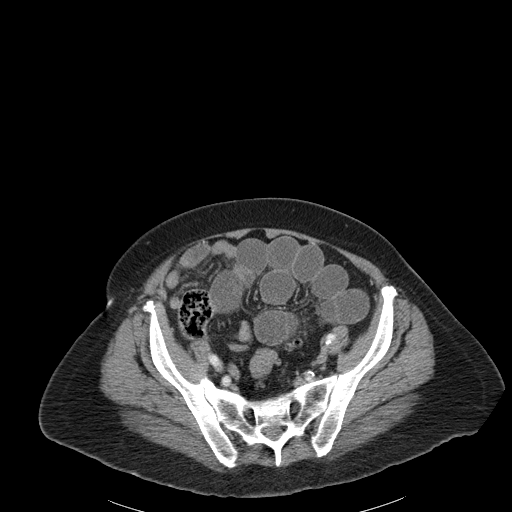
[im 38/88  soft-tissue]
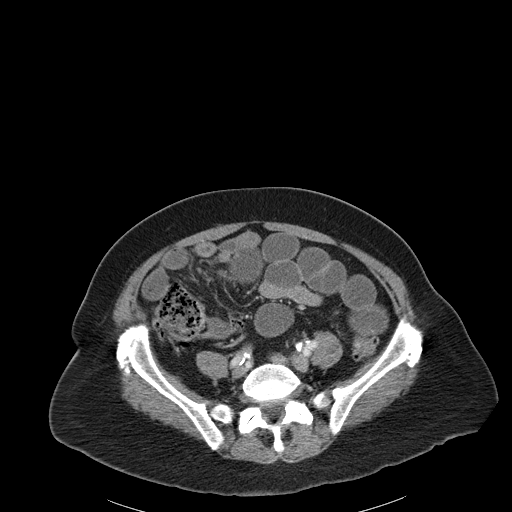
[im 46/88  soft-tissue]
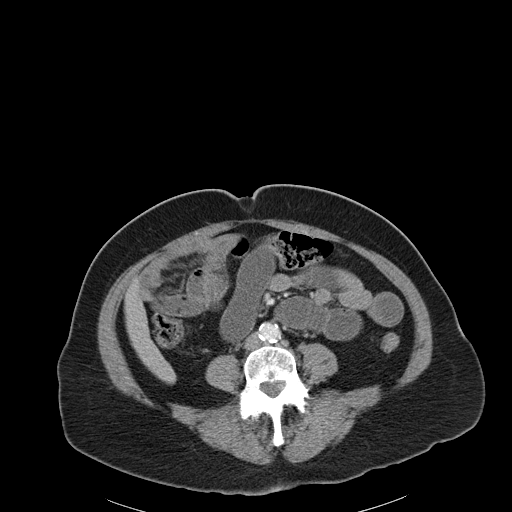
[im 50/88  soft-tissue]
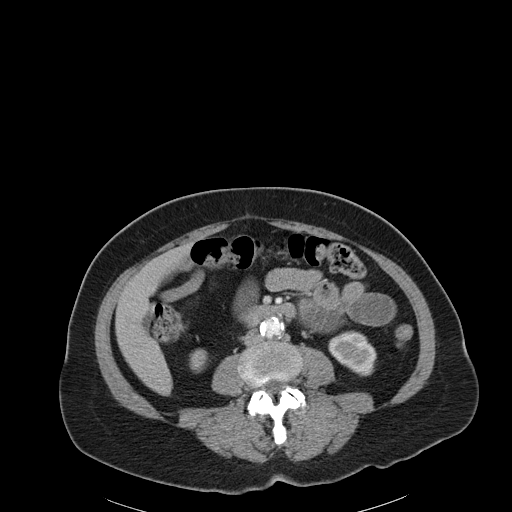
[im 57/88  soft-tissue]
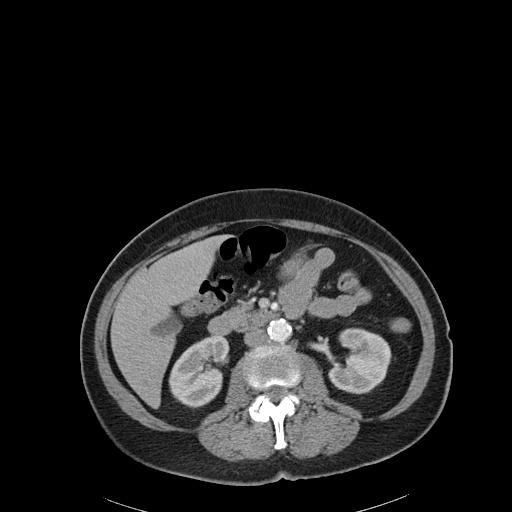
[im 57/88  bone]
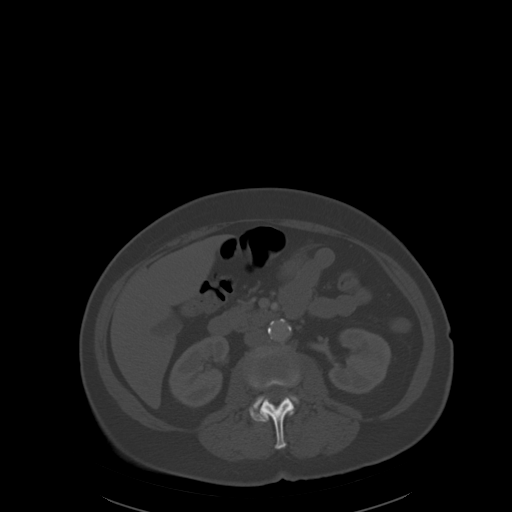
[im 65/88  soft-tissue]
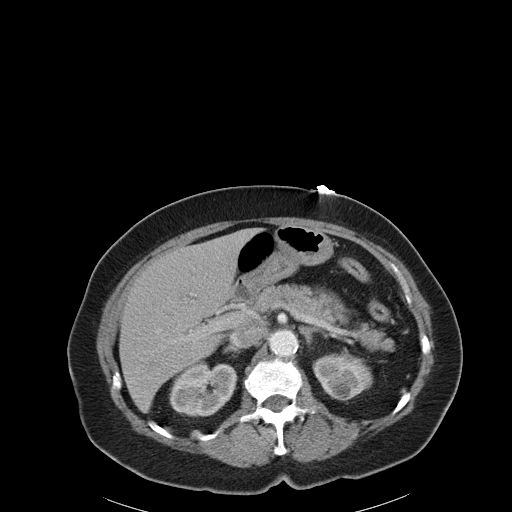
[im 69/88  soft-tissue]
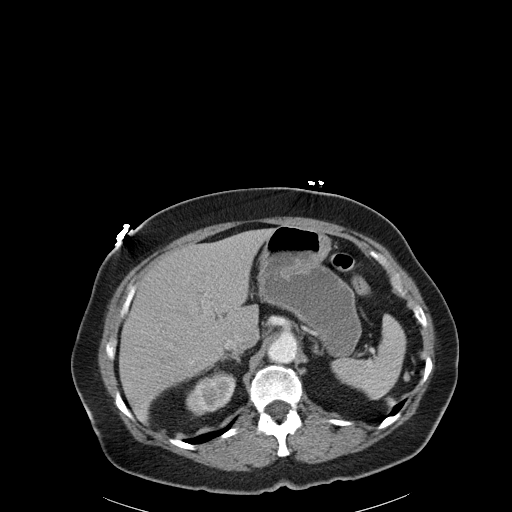
[im 72/88  lung]
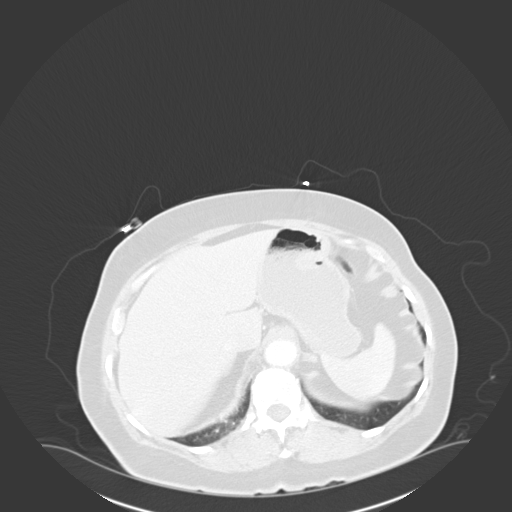
[im 76/88  soft-tissue]
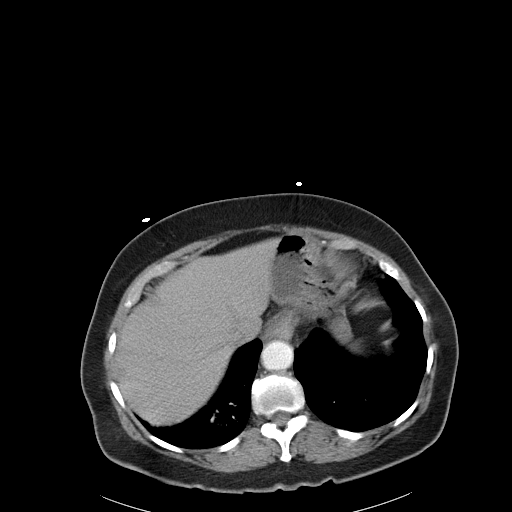
[im 76/88  lung]
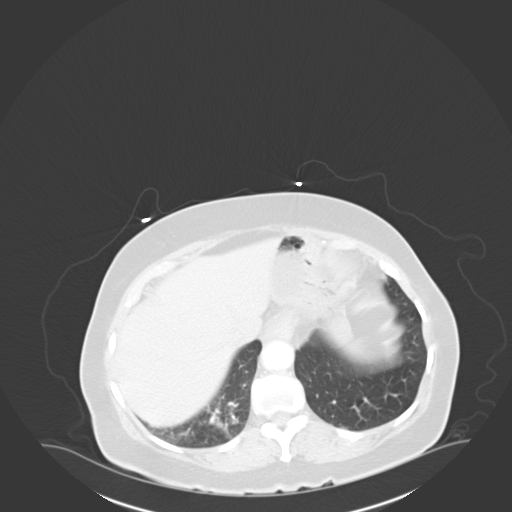
[im 80/88  lung]
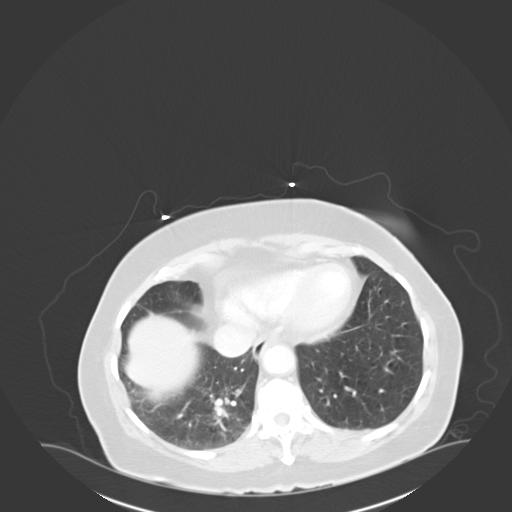
[im 84/88  soft-tissue]
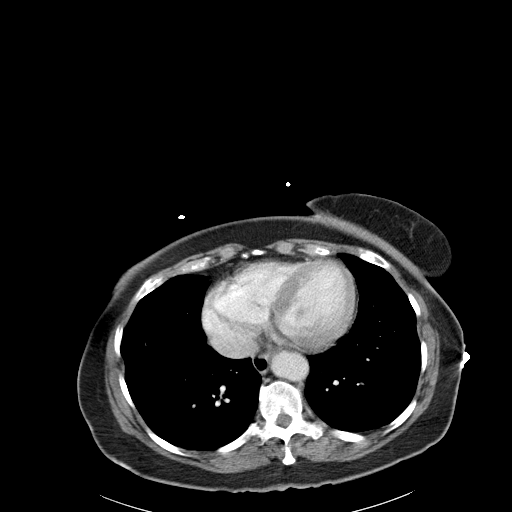
[im 84/88  lung]
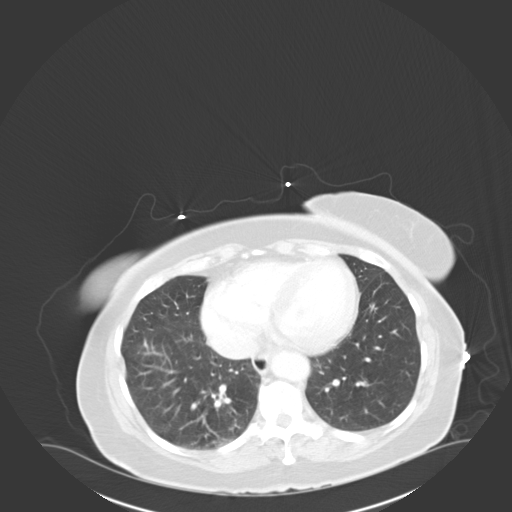

[15 of 32 positions shown; findings below may reference images not displayed]

PROCEDURE:     CT  - CT ABDOMEN / PELVIS  W  - September 18, 2009  [DATE]

RESULT:     CT of the abdomen and pelvis is performed utilizing 100 mL of
Xsovue-TPP iodinated intravenous contrast. No oral contrast was
administered. There multiple distended loops of fluid-filled small bowel.
There is no small bowel wall thickening. The terminal ileum is relatively
decompressed. The liver, gallbladder, adrenal glands and spleen appear to be
within normal limits. The pancreas is unremarkable. There is air and fecal
material within the ascending and transverse colon with relative
decompression of the descending colon and sigmoid colon. The urinary bladder
is nondistended. The uterus appears to be present in the left pelvic region
with a calcific area suggestive of a leiomyoma posteriorly best seen on
images 70 and 71. A definite site of transition of the bowel distention is
not seen. There may be an area of slight transition in the area of image 39
to 40 in the left midabdomen. The stomach is not significantly distended.
The aorta contains atherosclerotic calcification but shows no aneurysm.
There is no ascites or abscess. The appendix is not definitely identified.
IMPRESSION: Distended fluid-filled loops of small bowel with areas of
nondistended loops of small bowel as well. Ileus versus partial small bowel
obstruction are differential considerations. Clinical followup is
recommended. An area of transition may be present in the left midabdomen
midway between the midline and the lateral abdominal wall on images 39
through 41 but no obstructing mass is evident. Medical followup surgical
consultation is suggested.

## 2011-09-05 ENCOUNTER — Emergency Department: Payer: Self-pay | Admitting: Unknown Physician Specialty

## 2011-09-29 ENCOUNTER — Ambulatory Visit: Payer: Self-pay | Admitting: Family Medicine

## 2012-02-24 ENCOUNTER — Emergency Department: Payer: Self-pay | Admitting: Unknown Physician Specialty

## 2012-02-24 LAB — COMPREHENSIVE METABOLIC PANEL
Albumin: 3.4 g/dL (ref 3.4–5.0)
Anion Gap: 7 (ref 7–16)
BUN: 20 mg/dL — ABNORMAL HIGH (ref 7–18)
Bilirubin,Total: 0.3 mg/dL (ref 0.2–1.0)
Chloride: 107 mmol/L (ref 98–107)
Co2: 26 mmol/L (ref 21–32)
Creatinine: 1.08 mg/dL (ref 0.60–1.30)
EGFR (African American): 59 — ABNORMAL LOW
EGFR (Non-African Amer.): 51 — ABNORMAL LOW
Glucose: 103 mg/dL — ABNORMAL HIGH (ref 65–99)
Potassium: 3.8 mmol/L (ref 3.5–5.1)
Sodium: 140 mmol/L (ref 136–145)
Total Protein: 8 g/dL (ref 6.4–8.2)

## 2012-02-24 LAB — TROPONIN I: Troponin-I: <0.02 ng/mL

## 2012-02-24 LAB — CBC
HCT: 36.4 % (ref 35.0–47.0)
HGB: 12.1 g/dL (ref 12.0–16.0)
MCHC: 33.2 g/dL (ref 32.0–36.0)
MCV: 94 fL (ref 80–100)
Platelet: 196 10*3/uL (ref 150–440)
RBC: 3.9 10*6/uL (ref 3.80–5.20)
WBC: 9.2 10*3/uL (ref 3.6–11.0)

## 2012-02-24 LAB — URIC ACID: Uric Acid: 5.6 mg/dL (ref 2.6–6.0)

## 2012-02-24 LAB — MAGNESIUM: Magnesium: 1.9 mg/dL

## 2013-01-18 ENCOUNTER — Inpatient Hospital Stay: Payer: Self-pay | Admitting: Internal Medicine

## 2013-01-18 DIAGNOSIS — I10 Essential (primary) hypertension: Secondary | ICD-10-CM

## 2013-01-18 DIAGNOSIS — I059 Rheumatic mitral valve disease, unspecified: Secondary | ICD-10-CM

## 2013-01-18 DIAGNOSIS — R7989 Other specified abnormal findings of blood chemistry: Secondary | ICD-10-CM

## 2013-01-18 LAB — TROPONIN I
Troponin-I: 0.31 ng/mL — ABNORMAL HIGH
Troponin-I: 0.61 ng/mL — ABNORMAL HIGH
Troponin-I: 0.23 ng/mL — ABNORMAL HIGH

## 2013-01-18 LAB — PRO B NATRIURETIC PEPTIDE: B-Type Natriuretic Peptide: 228 pg/mL — ABNORMAL HIGH (ref 0–125)

## 2013-01-18 LAB — COMPREHENSIVE METABOLIC PANEL
Albumin: 3.6 g/dL (ref 3.4–5.0)
Calcium, Total: 9 mg/dL (ref 8.5–10.1)
Chloride: 106 mmol/L (ref 98–107)
EGFR (African American): 52 — ABNORMAL LOW
EGFR (Non-African Amer.): 45 — ABNORMAL LOW
Osmolality: 281 (ref 275–301)
SGOT(AST): 29 U/L (ref 15–37)

## 2013-01-18 LAB — CK-MB: CK-MB: 3.9 ng/mL — ABNORMAL HIGH (ref 0.5–3.6)

## 2013-01-18 LAB — CK TOTAL AND CKMB (NOT AT ARMC)
CK, Total: 297 U/L — ABNORMAL HIGH (ref 21–215)
CK-MB: 3.7 ng/mL — ABNORMAL HIGH (ref 0.5–3.6)

## 2013-01-18 LAB — PROTIME-INR
INR: 1
Prothrombin Time: 13.6 secs (ref 11.5–14.7)

## 2013-01-18 LAB — CBC
MCHC: 33.9 g/dL (ref 32.0–36.0)
MCV: 91 fL (ref 80–100)
Platelet: 246 10*3/uL (ref 150–440)
RBC: 4.05 10*6/uL (ref 3.80–5.20)
WBC: 13 10*3/uL — ABNORMAL HIGH (ref 3.6–11.0)

## 2013-01-18 LAB — APTT: Activated PTT: >160 secs (ref 23.6–35.9)

## 2013-01-19 ENCOUNTER — Telehealth: Payer: Self-pay

## 2013-01-19 DIAGNOSIS — I214 Non-ST elevation (NSTEMI) myocardial infarction: Secondary | ICD-10-CM

## 2013-01-19 LAB — CBC WITH DIFFERENTIAL/PLATELET
Basophil #: 0 10*3/uL (ref 0.0–0.1)
Eosinophil #: 0 10*3/uL (ref 0.0–0.7)
Eosinophil %: 0 %
HCT: 35.8 % (ref 35.0–47.0)
Lymphocyte %: 7.8 %
MCHC: 33.3 g/dL (ref 32.0–36.0)
MCV: 92 fL (ref 80–100)
Neutrophil %: 91.1 %
RBC: 3.89 10*6/uL (ref 3.80–5.20)
RDW: 15.6 % — ABNORMAL HIGH (ref 11.5–14.5)
WBC: 10.1 10*3/uL (ref 3.6–11.0)

## 2013-01-19 LAB — BASIC METABOLIC PANEL
Calcium, Total: 9 mg/dL (ref 8.5–10.1)
Chloride: 107 mmol/L (ref 98–107)
Creatinine: 1.04 mg/dL (ref 0.60–1.30)
Osmolality: 281 (ref 275–301)
Potassium: 4.8 mmol/L (ref 3.5–5.1)

## 2013-01-19 LAB — LIPID PANEL
Ldl Cholesterol, Calc: 96 mg/dL (ref 0–100)
Triglycerides: 78 mg/dL (ref 0–200)
VLDL Cholesterol, Calc: 16 mg/dL (ref 5–40)

## 2013-01-19 LAB — APTT: Activated PTT: 81.9 secs — ABNORMAL HIGH (ref 23.6–35.9)

## 2013-01-19 NOTE — Telephone Encounter (Signed)
Pt d/c today 430 Will attempt TCM call #1 5/1

## 2013-01-19 NOTE — Telephone Encounter (Signed)
Message copied by [facility], Sherry Rogus E on Wed Jan 19, 2013 12:49 PM ------      Message from: Thersa Salt      Created: Wed Jan 19, 2013 11:41 AM      Regarding: tcm       appt with Alinda Money 5/7 ------

## 2013-01-19 NOTE — Telephone Encounter (Signed)
TCM  

## 2013-01-21 NOTE — Telephone Encounter (Signed)
Patient contacted regarding discharge from [facility] on 01/19/13.  Patient understands to follow up with provider Alinda Money, PA on 01/26/13 at 1130 at [place] office. Patient understands discharge instructions? yes Patient understands medications and regiment? yes Patient understands to bring all medications to this visit? yes  Denies CP, sob Confirms compliance with medications as prescribed Will call us sooner should she need [place] before appt

## 2013-01-25 ENCOUNTER — Encounter: Payer: Self-pay | Admitting: *Deleted

## 2013-01-26 ENCOUNTER — Encounter: Payer: Self-pay | Admitting: Physician Assistant

## 2013-01-26 ENCOUNTER — Ambulatory Visit (INDEPENDENT_AMBULATORY_CARE_PROVIDER_SITE_OTHER): Payer: Medicare Other | Admitting: Physician Assistant

## 2013-01-26 VITALS — BP 154/85 | HR 111 | Ht 71.0 in | Wt 172.0 lb

## 2013-01-26 DIAGNOSIS — I498 Other specified cardiac arrhythmias: Secondary | ICD-10-CM

## 2013-01-26 DIAGNOSIS — R000 Tachycardia, unspecified: Secondary | ICD-10-CM | POA: Insufficient documentation

## 2013-01-26 DIAGNOSIS — F17200 Nicotine dependence, unspecified, uncomplicated: Secondary | ICD-10-CM

## 2013-01-26 DIAGNOSIS — R0602 Shortness of breath: Secondary | ICD-10-CM

## 2013-01-26 DIAGNOSIS — I5042 Chronic combined systolic (congestive) and diastolic (congestive) heart failure: Secondary | ICD-10-CM | POA: Insufficient documentation

## 2013-01-26 DIAGNOSIS — I509 Heart failure, unspecified: Secondary | ICD-10-CM

## 2013-01-26 DIAGNOSIS — I10 Essential (primary) hypertension: Secondary | ICD-10-CM | POA: Insufficient documentation

## 2013-01-26 DIAGNOSIS — E119 Type 2 diabetes mellitus without complications: Secondary | ICD-10-CM | POA: Insufficient documentation

## 2013-01-26 DIAGNOSIS — E785 Hyperlipidemia, unspecified: Secondary | ICD-10-CM | POA: Insufficient documentation

## 2013-01-26 MED ORDER — METOPROLOL SUCCINATE ER 25 MG PO TB24: 25.0000 mg | ORAL_TABLET | Freq: Every day | ORAL | Status: DC

## 2013-01-26 MED ORDER — LISINOPRIL 2.5 MG PO TABS: 2.5000 mg | ORAL_TABLET | Freq: Every day | ORAL | Status: DC

## 2013-01-26 NOTE — Assessment & Plan Note (Signed)
Spent > 3 min discussing the need for and ways to quit. She would like to attempt cessation on her own. She will call the office to request NRT as a means of assistance if needed.

## 2013-01-26 NOTE — Assessment & Plan Note (Addendum)
Chronicity unclear. She denies frank CHF-type symptoms- no LE edema, PND, orthopnea, weight gain . She does note stable, unchanged DOE/SOB which may be attributed to COPD. Euvolemic on exam today. DDx includes underlying ischemic cardiomyoptahy (denies chest pain, but is elderly, female and newly diagnosed diabetic, and may present atypically, recent troponin bump, multiple cardiac RFs), longstanding HTN or tachy-mediated with persistent sinus tachycardia. With isolated WMAs on recent echo, would be most suspicious for the former, but there is a likely a combination of these causes. Have discussed further ischemic work-up in the form of Lexiscan Myoview, she understands the different paths associated with a normal and abnormal stress test, and is willing to proceed. In the meantime, will optimize cardiac meds- specifically ACEi and BB. She has not tolerated enalapril or Lopressor in the past. Will switch with lisinopril and Toprol-XL. Resume low-dose ASA, statin, NTG SL PRN (this was recently prescribed). No need for a diuretic at present.

## 2013-01-26 NOTE — Assessment & Plan Note (Signed)
154/85 in the office today. Will add ACEi and BB with recently discovered cardiomyopathy. Advised to monitor BP and symptoms with the addition of these antihypertensives. She will continue HCTZ. Consider discontinuing if develops hypotension/dizziness.

## 2013-01-26 NOTE — Assessment & Plan Note (Signed)
Hgb A1C 7.3% on recent [facility] admission. She is set to follow-up with her PCP on 02/01/13. Will defer initiation of management to Dr. Darliss Cheney.

## 2013-01-26 NOTE — Assessment & Plan Note (Addendum)
This has been an issue for the patient in the past. She does endorse frequent palpitations attributed to stress. There is considerable ectopy on EKG in the office today. Will add Toprol-XL daily, advised to take additional PRN for palpitations. Low suspicion for PE with normal RVSP on recent echo and lack of chest pain, dyspnea or hypoxia. Likely associated with cardiac structural changes from underlying cardiomyopathy and stress. Will request thyroid studies be checked by PCP on upcoming follow-up as well.

## 2013-01-26 NOTE — Progress Notes (Signed)
Date:  01/26/2013   ID:  [name] [name], DOB 1935-11-24, MRN [id number]  PCP:  [name] Rhyme, MD  Primary Cardiologist:  [name] Se, MD (see recently in consultation at [facility]); D. Bensimhon, MD (seen previously at [facility])   History of Present Illness: [name] [name] is a 77 y.o. 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   She has been seen previously by Dr. Gala [name]. She underwent a cardiac catheterization in 2008 which revealed normal cors. She was evaluated for tachycardia/palpitations at that time which were managed by AVN blockers.   She was recently admitted to [facility] for cough and shortness of breath attributed to bronchitis. Cardiology was consulted due to elevated cardiac enzymes (trend 0.31->0.61->0.23).   2D echo 01/18/2013: LVEF 30-35%, mod global LV systolic dysfunction, diastolic dysfunction, mid-distal anterior wall, apical and mid-apical inferior/posterior WMAs, mild MR/TR, normal RVSP.   LABWORK Hgb A1C 7.3% LDL 96 HDL 35 TC 147 TG 78  She denies any prior or current chest pain, orthopnea, PND, LE edema, weight gain or syncope. She notes chronic DOE/SOB which she attributes to smoking and COPD. She does note persistent palpitations. She has stopped taking Enalapril and Lopressor due to dizziness. No longer taking diltiazem. BP in the office 154/85 today. Significant ectopy on her EKG, sinus tachycardia, IVCD and LVH.   She has been caring for a daughter with advanced cancer, and endorses a significant amount of stress with this. She has had a poor appetite recently. She is on an anxiolytic which helps. She has strong family support, and is now taking time to make sure she cares for her own health as well.   She continues to smoke 1-4 cigarettes/day as a stress coping mechanism. She would like to  attempt quitting on her own. We discussed NRT as a means of assistance.   EKG demonstrates sinus tachycardia, 111 bpm, RBBB, LAFB, frequent PVCs, LAD.  Wt Readings from Last 3 Encounters:  06/20/10 181 lb (82.101 kg)     Past Medical History  Diagnosis Date  . Hypertension   . Hyperlipidemia   . COPD (chronic obstructive pulmonary disease)     With ongoing tobacco use  . GERD (gastroesophageal reflux disease)   . Anxiety   . Rheumatoid arteritis   . Osteoarthritis   . Hyperuricemia   . Diabetes mellitus without complication   . CHF (congestive heart failure) 01/18/13    LVEF 30-35%, mod global LV systolic dysfunction, diastolic dysfunction, mid-distal anterior wall, apical and mid-apical inferior/posterior WMAs, mild MR/TR, normal RVSP.     Current Outpatient Prescriptions  Medication Sig Dispense Refill  . albuterol (PROVENTIL HFA;VENTOLIN HFA) 108 (90 BASE) MCG/ACT inhaler Inhale 2 puffs into the lungs every 6 (six) hours as needed for wheezing.      Marland Kitchen ALPRAZolam (XANAX) 0.25 MG tablet Take 0.25 mg by mouth 3 (three) times daily as needed.        Marland Kitchen aspirin 81 MG tablet Take 81 mg by mouth daily.      Marland Kitchen atorvastatin (LIPITOR) 20 MG tablet Take 20 mg by mouth daily.      . budesonide-formoterol (SYMBICORT) 160-4.5 MCG/ACT inhaler Inhale 2 puffs into the lungs 2 (two) times daily.      Marland Kitchen Dextromethorphan-Guaifenesin 5-100 MG/5ML LIQD Take 5 mLs by mouth 3 (three) times daily as needed.      . diltiazem (CARDIZEM) 120 MG tablet  Take 120 mg by mouth daily.        . enalapril (VASOTEC) 2.5 MG tablet Take 2.5 mg by mouth daily.      . folic acid (FOLVITE) 1 MG tablet Take 1 mg by mouth daily.        . hydrochlorothiazide 25 MG tablet Take 25 mg by mouth daily.        Marland Kitchen levofloxacin (LEVAQUIN) 250 MG tablet Take 250 mg by mouth daily.      . methotrexate (RHEUMATREX) 2.5 MG tablet Take 2.5 mg by mouth daily. Caution:Chemotherapy. Protect from light.       . metoprolol tartrate  (LOPRESSOR) 25 MG tablet Take 25 mg by mouth 2 (two) times daily.      . nitroGLYCERIN (NITROSTAT) 0.4 MG SL tablet Place 0.4 mg under the tongue every 5 (five) minutes as needed for chest pain.      Marland Kitchen omeprazole (PRILOSEC) 40 MG capsule Take 40 mg by mouth daily.      . predniSONE (DELTASONE) 5 MG tablet Take 5 mg by mouth daily.         No current facility-administered medications for this visit.    Allergies:    Allergies  Allergen Reactions  . Benadryl (Diphenhydramine Hcl)   . Sulfa Antibiotics     Social History:   History   Social History  . Marital Status: Married    Spouse Name: N/A    Number of Children: N/A  . Years of Education: N/A   Occupational History  . Not on file.   Social History Main Topics  . Smoking status: Current Some Day Smoker -- 0.25 packs/day for 50 years    Types: Cigarettes  . Smokeless tobacco: Not on file  . Alcohol Use: No  . Drug Use: No  . Sexually Active: Not on file   Other Topics Concern  . Not on file   Social History Narrative   Widowed    ROS:  Please see the history of present illness.     All other systems reviewed and negative.   PHYSICAL EXAM: VS:   Filed Vitals:   01/26/13 1139  BP: 154/85  Pulse: 111    Gen: Well nourished, well developed, in no acute distress HEENT: normal Neck: no JVD Cardiac:  normal S1, S2; RRR; no murmur Lungs:  Distant breath sounds, clear to auscultation bilaterally, no wheezing, rhonchi or rales Abd: soft, nontender, no hepatomegaly Ext: no edema Skin: warm and dry Neuro:  CNs 2-12 intact, no focal abnormalities noted Psych: normal affect Musculoskeletal: normal ROM, strength and tone appropriate for age

## 2013-01-26 NOTE — Patient Instructions (Addendum)
Please take all new medications as prescribed. Please take an extra metoprolol succinate (Toprol-XL) tablet as needed for palpitations (heart fluttering). Please monitor your blood pressure and any new symptoms when starting new blood pressure medications.   We will schedule a stress test next week. You will be informed of the date and time. This will be performed at [facility].  Please take nitroglycerin as prescribed should you chest pain before then.   We will see you again in 3 months and notify you of the results of the stress test and any further plans.   Please have your primary doctor check your thyroid blood work.   If you have any further questions, feel free to call the office.

## 2013-02-18 DIAGNOSIS — I10 Essential (primary) hypertension: Secondary | ICD-10-CM

## 2013-03-01 ENCOUNTER — Emergency Department: Payer: Self-pay | Admitting: Internal Medicine

## 2013-03-30 ENCOUNTER — Ambulatory Visit: Admitting: Cardiovascular Disease

## 2013-04-09 ENCOUNTER — Emergency Department: Payer: Self-pay | Admitting: Unknown Physician Specialty

## 2013-04-09 LAB — COMPREHENSIVE METABOLIC PANEL
Alkaline Phosphatase: 80 U/L (ref 50–136)
Anion Gap: 4 — ABNORMAL LOW (ref 7–16)
Bilirubin,Total: 0.5 mg/dL (ref 0.2–1.0)
Calcium, Total: 9.1 mg/dL (ref 8.5–10.1)
Chloride: 105 mmol/L (ref 98–107)
Creatinine: 1.25 mg/dL (ref 0.60–1.30)
Glucose: 108 mg/dL — ABNORMAL HIGH (ref 65–99)
Total Protein: 7.8 g/dL (ref 6.4–8.2)

## 2013-04-09 LAB — CBC
HCT: 34.6 % — ABNORMAL LOW (ref 35.0–47.0)
HGB: 11.5 g/dL — ABNORMAL LOW (ref 12.0–16.0)
MCHC: 33.1 g/dL (ref 32.0–36.0)
MCV: 92 fL (ref 80–100)
Platelet: 208 10*3/uL (ref 150–440)
RBC: 3.78 10*6/uL — ABNORMAL LOW (ref 3.80–5.20)
RDW: 14.8 % — ABNORMAL HIGH (ref 11.5–14.5)
WBC: 6.8 10*3/uL (ref 3.6–11.0)

## 2013-04-09 LAB — LIPASE, BLOOD: Lipase: 129 U/L (ref 73–393)

## 2013-04-14 ENCOUNTER — Ambulatory Visit (INDEPENDENT_AMBULATORY_CARE_PROVIDER_SITE_OTHER): Payer: Medicare Other | Admitting: Cardiovascular Disease

## 2013-04-14 ENCOUNTER — Encounter: Payer: Self-pay | Admitting: Cardiovascular Disease

## 2013-04-14 VITALS — BP 122/72 | HR 98 | Ht 71.0 in | Wt 169.2 lb

## 2013-04-14 DIAGNOSIS — I4949 Other premature depolarization: Secondary | ICD-10-CM

## 2013-04-14 DIAGNOSIS — E785 Hyperlipidemia, unspecified: Secondary | ICD-10-CM

## 2013-04-14 DIAGNOSIS — I10 Essential (primary) hypertension: Secondary | ICD-10-CM

## 2013-04-14 DIAGNOSIS — F17200 Nicotine dependence, unspecified, uncomplicated: Secondary | ICD-10-CM

## 2013-04-14 DIAGNOSIS — I498 Other specified cardiac arrhythmias: Secondary | ICD-10-CM

## 2013-04-14 DIAGNOSIS — I493 Ventricular premature depolarization: Secondary | ICD-10-CM

## 2013-04-14 DIAGNOSIS — R05 Cough: Secondary | ICD-10-CM

## 2013-04-14 DIAGNOSIS — I509 Heart failure, unspecified: Secondary | ICD-10-CM

## 2013-04-14 DIAGNOSIS — R000 Tachycardia, unspecified: Secondary | ICD-10-CM

## 2013-04-14 DIAGNOSIS — I5042 Chronic combined systolic (congestive) and diastolic (congestive) heart failure: Secondary | ICD-10-CM

## 2013-04-14 NOTE — Assessment & Plan Note (Signed)
Cholesterol is at goal on the current lipid regimen. No changes to the medications were made.  

## 2013-04-14 NOTE — Assessment & Plan Note (Addendum)
Prior EKGs showing frequent PVCs. She is relatively asymptomatic. We'll need to monitor this closely. Given underlying cardiopathy, uncertain if arrhythmia could be contributing to low ejection fraction. Option would be to start antiarrhythmic such as amiodarone. We'll monitor for now

## 2013-04-14 NOTE — Assessment & Plan Note (Signed)
Appears to be doing well, euvolemic. Will hold the ACE inhibitor given recent significant cough. Cough does not seem to be secondary to heart failure.

## 2013-04-14 NOTE — Progress Notes (Signed)
Patient ID: [name] [name], female    DOB: May 26, 1936, 77 y.o.   MRN: [id number]  HPI Comments: [name] [name] is a 77 y.o. female with combined CHF (EF 30-35%, + diastolic dysfunction, multiple WMAs), HTN, HLD, COPD, ongoing tobacco abuse, rheumatoid arthritis, borderline DM2 and GERD. She presents for routine followup  cardiac catheterization in 2008 which revealed normal cors. She was evaluated for tachycardia/palpitations at that time which were managed by AVN blockers.   Previously admitted to [facility] April 2014 for cough and shortness of breath attributed to bronchitis. Cardiology was consulted due to elevated cardiac enzymes (trend 0.31->0.61->0.23).   2D echo 01/18/2013: LVEF 30-35%, mod global LV systolic dysfunction, diastolic dysfunction, mid-distal anterior wall, apical and mid-apical inferior/posterior WMAs, mild MR/TR, normal RVSP.   Hgb A1C 7.3%,  LDL 96,  TC 147  She reports that since April, she has had a cough. She has seen ear nose throat who suggested she hold her ACE inhibitor. Starting yesterday, she has been holding lisinopril. Cough have been ongoing during the daytime, nighttime. She states it has caused her sleepless nights, weight loss.   She notes chronic DOE/SOB which she attributes to smoking and COPD. occasional palpitations.   Daughter recently passed away from lung cancer. Other family members with medical issues as well Staying with her son at nighttime  She continues to smoke 1-4 cigarettes/day as a stress coping mechanism. She would like to attempt quitting on her own. We discussed NRT as a means of assistance.   EKG in the hospital showing normal sinus rhythm with PVCs in a bigeminal pattern, rate 75 beats per minute dated 04/09/2013 EKG demonstrates normal sinus rhythm with rare PVC   Outpatient Encounter Prescriptions as of 04/14/2013  Medication Sig Dispense Refill  . albuterol (PROVENTIL HFA;VENTOLIN HFA) 108 (90 BASE) MCG/ACT inhaler Inhale 2  puffs into the lungs every 6 (six) hours as needed for wheezing.      Marland Kitchen ALPRAZolam (XANAX) 0.25 MG tablet Take 0.25 mg by mouth 3 (three) times daily as needed.        Marland Kitchen aspirin 81 MG tablet Take 81 mg by mouth daily.      Marland Kitchen atorvastatin (LIPITOR) 20 MG tablet Take 20 mg by mouth daily.      . budesonide-formoterol (SYMBICORT) 160-4.5 MCG/ACT inhaler Inhale 2 puffs into the lungs 2 (two) times daily.      Marland Kitchen Dextromethorphan-Guaifenesin 5-100 MG/5ML LIQD Take 5 mLs by mouth 3 (three) times daily as needed.      . hydrochlorothiazide 25 MG tablet Take 25 mg by mouth daily.        . metoprolol succinate (TOPROL XL) 25 MG 24 hr tablet Take 1 tablet (25 mg total) by mouth daily.  30 tablet  3  . nitroGLYCERIN (NITROSTAT) 0.4 MG SL tablet Place 0.4 mg under the tongue every 5 (five) minutes as needed for chest pain.      Marland Kitchen omeprazole (PRILOSEC) 40 MG capsule Take 40 mg by mouth daily.      Lisinopril stopped yesterday by the patient for cough  Review of Systems  Constitutional: Negative.   HENT: Negative.   Eyes: Negative.   Respiratory: Positive for cough.   Cardiovascular: Negative.   Gastrointestinal: Negative.   Musculoskeletal: Negative.   Skin: Negative.   Neurological: Negative.   Psychiatric/Behavioral: Negative.   All other systems reviewed and are negative.    BP 122/72  Pulse 98  Ht 5\' 11"  (1.803 m)  Wt 169 lb 4 oz (  76.771 kg)  BMI 23.62 kg/m2  Physical Exam

## 2013-04-14 NOTE — Assessment & Plan Note (Signed)
Suspect could be secondary to ACE inhibitor. Lisinopril has been held

## 2013-04-14 NOTE — Assessment & Plan Note (Signed)
Blood pressure is well controlled on today's visit. No changes made to the medications. 

## 2013-04-14 NOTE — Patient Instructions (Addendum)
You are doing well. No medication changes were made.  If your cough continues, please call the office  Please call us if you have new issues that need to be addressed before your next appt.  Your physician wants you to follow-up in: 2 month.

## 2013-04-14 NOTE — Assessment & Plan Note (Signed)
We have encouraged her to continue to work on weaning her cigarettes and smoking cessation. She will continue to work on this and does not want any assistance with chantix.  

## 2013-04-18 ENCOUNTER — Telehealth: Payer: Self-pay

## 2013-04-18 NOTE — Telephone Encounter (Signed)
Pt has some questions regarding her medications. Does not know the name of the medicine, not sure if she has been taking the right medicine.

## 2013-04-18 NOTE — Telephone Encounter (Signed)
Pt called wanting to know the names of her heart medications. Clarified Metoprolol 25mg  QD for heart, HCTZ 25mg  QDfor fluid, and Liptior 20mg  QD for cholesterol.  Pt states she is taking these 3 medications as rx.

## 2013-05-02 ENCOUNTER — Emergency Department: Payer: Self-pay | Admitting: Emergency Medicine

## 2013-06-16 ENCOUNTER — Encounter: Payer: Self-pay | Admitting: *Deleted

## 2013-06-16 ENCOUNTER — Ambulatory Visit: Admitting: Cardiovascular Disease

## 2013-06-29 ENCOUNTER — Other Ambulatory Visit: Payer: Self-pay | Admitting: *Deleted

## 2013-06-29 MED ORDER — METOPROLOL SUCCINATE ER 25 MG PO TB24: 25.0000 mg | ORAL_TABLET | Freq: Every day | ORAL | Status: DC

## 2013-06-29 NOTE — Telephone Encounter (Signed)
Refilled Metoprolol sent to [facility]. 

## 2013-08-29 ENCOUNTER — Ambulatory Visit: Payer: Self-pay | Admitting: Ophthalmology

## 2013-08-29 LAB — POTASSIUM: Potassium: 4.1 mmol/L (ref 3.5–5.1)

## 2013-09-06 ENCOUNTER — Ambulatory Visit: Payer: Self-pay | Admitting: Ophthalmology

## 2013-09-22 ENCOUNTER — Emergency Department: Payer: Self-pay | Admitting: Emergency Medicine

## 2013-09-30 ENCOUNTER — Emergency Department: Payer: Self-pay | Admitting: Emergency Medicine

## 2013-09-30 LAB — COMPREHENSIVE METABOLIC PANEL
ALK PHOS: 71 U/L
AST: 12 U/L — AB (ref 15–37)
Albumin: 3.1 g/dL — ABNORMAL LOW (ref 3.4–5.0)
Anion Gap: 1 — ABNORMAL LOW (ref 7–16)
BUN: 17 mg/dL (ref 7–18)
Bilirubin,Total: 0.4 mg/dL (ref 0.2–1.0)
Calcium, Total: 9 mg/dL (ref 8.5–10.1)
Chloride: 97 mmol/L — ABNORMAL LOW (ref 98–107)
Co2: 32 mmol/L (ref 21–32)
Creatinine: 1.15 mg/dL (ref 0.60–1.30)
EGFR (African American): 54 — ABNORMAL LOW
GFR CALC NON AF AMER: 47 — AB
Glucose: 128 mg/dL — ABNORMAL HIGH (ref 65–99)
OSMOLALITY: 264 (ref 275–301)
Potassium: 4.3 mmol/L (ref 3.5–5.1)
SGPT (ALT): 17 U/L (ref 12–78)
Sodium: 130 mmol/L — ABNORMAL LOW (ref 136–145)
TOTAL PROTEIN: 8 g/dL (ref 6.4–8.2)

## 2013-09-30 LAB — URINALYSIS, COMPLETE
BILIRUBIN, UR: NEGATIVE
BLOOD: NEGATIVE
GLUCOSE, UR: NEGATIVE mg/dL (ref 0–75)
Ketone: NEGATIVE
Leukocyte Esterase: NEGATIVE
NITRITE: NEGATIVE
PH: 7 (ref 4.5–8.0)
Protein: NEGATIVE
SPECIFIC GRAVITY: 1.019 (ref 1.003–1.030)

## 2013-09-30 LAB — CBC
HCT: 41.6 % (ref 35.0–47.0)
HGB: 13.9 g/dL (ref 12.0–16.0)
MCH: 30.3 pg (ref 26.0–34.0)
MCHC: 33.4 g/dL (ref 32.0–36.0)
MCV: 91 fL (ref 80–100)
Platelet: 197 10*3/uL (ref 150–440)
RBC: 4.59 10*6/uL (ref 3.80–5.20)
RDW: 15.1 % — ABNORMAL HIGH (ref 11.5–14.5)
WBC: 12.2 10*3/uL — AB (ref 3.6–11.0)

## 2013-10-27 ENCOUNTER — Emergency Department: Payer: Self-pay | Admitting: Emergency Medicine

## 2013-10-27 LAB — CBC WITH DIFFERENTIAL/PLATELET
BASOS ABS: 0 10*3/uL (ref 0.0–0.1)
Basophil %: 0.5 %
Eosinophil #: 0.1 10*3/uL (ref 0.0–0.7)
Eosinophil %: 1.5 %
HCT: 36 % (ref 35.0–47.0)
HGB: 11.5 g/dL — ABNORMAL LOW (ref 12.0–16.0)
LYMPHS PCT: 29.2 %
Lymphocyte #: 2.6 10*3/uL (ref 1.0–3.6)
MCH: 29.2 pg (ref 26.0–34.0)
MCHC: 32.1 g/dL (ref 32.0–36.0)
MCV: 91 fL (ref 80–100)
MONOS PCT: 5.7 %
Monocyte #: 0.5 x10 3/mm (ref 0.2–0.9)
NEUTROS ABS: 5.5 10*3/uL (ref 1.4–6.5)
Neutrophil %: 63.1 %
Platelet: 253 10*3/uL (ref 150–440)
RBC: 3.95 10*6/uL (ref 3.80–5.20)
RDW: 15.1 % — AB (ref 11.5–14.5)
WBC: 8.8 10*3/uL (ref 3.6–11.0)

## 2013-10-27 LAB — BASIC METABOLIC PANEL
Anion Gap: 4 — ABNORMAL LOW (ref 7–16)
BUN: 14 mg/dL (ref 7–18)
CALCIUM: 9 mg/dL (ref 8.5–10.1)
Chloride: 110 mmol/L — ABNORMAL HIGH (ref 98–107)
Co2: 27 mmol/L (ref 21–32)
Creatinine: 1.12 mg/dL (ref 0.60–1.30)
EGFR (Non-African Amer.): 48 — ABNORMAL LOW
GFR CALC AF AMER: 56 — AB
Glucose: 134 mg/dL — ABNORMAL HIGH (ref 65–99)
Osmolality: 284 (ref 275–301)
POTASSIUM: 3.9 mmol/L (ref 3.5–5.1)
Sodium: 141 mmol/L (ref 136–145)

## 2013-10-27 LAB — TROPONIN I
Troponin-I: <0.02 ng/mL
Troponin-I: <0.02 ng/mL

## 2013-11-05 ENCOUNTER — Other Ambulatory Visit: Payer: Self-pay | Admitting: Cardiovascular Disease

## 2013-11-14 ENCOUNTER — Ambulatory Visit: Payer: Self-pay | Admitting: Ophthalmology

## 2013-11-25 ENCOUNTER — Emergency Department: Payer: Self-pay | Admitting: Emergency Medicine

## 2013-12-22 ENCOUNTER — Emergency Department: Payer: Self-pay | Admitting: Emergency Medicine

## 2013-12-23 LAB — CBC
HCT: 36.5 % (ref 35.0–47.0)
HGB: 11.6 g/dL — AB (ref 12.0–16.0)
MCH: 29.6 pg (ref 26.0–34.0)
MCHC: 31.8 g/dL — AB (ref 32.0–36.0)
MCV: 93 fL (ref 80–100)
Platelet: 205 10*3/uL (ref 150–440)
RBC: 3.92 10*6/uL (ref 3.80–5.20)
RDW: 17.2 % — ABNORMAL HIGH (ref 11.5–14.5)
WBC: 12.1 10*3/uL — ABNORMAL HIGH (ref 3.6–11.0)

## 2013-12-23 LAB — BASIC METABOLIC PANEL
Anion Gap: 7 (ref 7–16)
BUN: 33 mg/dL — ABNORMAL HIGH (ref 7–18)
Calcium, Total: 8.7 mg/dL (ref 8.5–10.1)
Chloride: 110 mmol/L — ABNORMAL HIGH (ref 98–107)
Co2: 24 mmol/L (ref 21–32)
Creatinine: 1.48 mg/dL — ABNORMAL HIGH (ref 0.60–1.30)
EGFR (Non-African Amer.): 34 — ABNORMAL LOW
GFR CALC AF AMER: 40 — AB
GLUCOSE: 294 mg/dL — AB (ref 65–99)
Osmolality: 299 (ref 275–301)
Potassium: 3.9 mmol/L (ref 3.5–5.1)
Sodium: 141 mmol/L (ref 136–145)

## 2013-12-23 LAB — HEPATIC FUNCTION PANEL A (ARMC)
ALK PHOS: 96 U/L
Albumin: 3.2 g/dL — ABNORMAL LOW (ref 3.4–5.0)
BILIRUBIN TOTAL: 0.2 mg/dL (ref 0.2–1.0)
Bilirubin, Direct: <0.1 mg/dL (ref 0.00–0.20)
SGOT(AST): 59 U/L — ABNORMAL HIGH (ref 15–37)
SGPT (ALT): 38 U/L (ref 12–78)
Total Protein: 7.9 g/dL (ref 6.4–8.2)

## 2013-12-23 LAB — LIPASE, BLOOD: Lipase: 406 U/L — ABNORMAL HIGH (ref 73–393)

## 2013-12-23 LAB — TROPONIN I: Troponin-I: <0.02 ng/mL

## 2014-01-10 ENCOUNTER — Observation Stay: Payer: Self-pay | Admitting: Family Medicine

## 2014-01-10 LAB — URINALYSIS, COMPLETE
BACTERIA: NONE SEEN
Bilirubin,UR: NEGATIVE
Glucose,UR: NEGATIVE mg/dL (ref 0–75)
KETONE: NEGATIVE
LEUKOCYTE ESTERASE: NEGATIVE
NITRITE: NEGATIVE
PH: 5 (ref 4.5–8.0)
Protein: NEGATIVE
SPECIFIC GRAVITY: 1.036 (ref 1.003–1.030)
WBC UR: <1 /HPF (ref 0–5)

## 2014-01-10 LAB — COMPREHENSIVE METABOLIC PANEL
ALBUMIN: 2.9 g/dL — AB (ref 3.4–5.0)
ANION GAP: 2 — AB (ref 7–16)
AST: 20 U/L (ref 15–37)
Alkaline Phosphatase: 69 U/L
BILIRUBIN TOTAL: 0.2 mg/dL (ref 0.2–1.0)
BUN: 18 mg/dL (ref 7–18)
CREATININE: 1.08 mg/dL (ref 0.60–1.30)
Calcium, Total: 8.5 mg/dL (ref 8.5–10.1)
Chloride: 111 mmol/L — ABNORMAL HIGH (ref 98–107)
Co2: 28 mmol/L (ref 21–32)
EGFR (Non-African Amer.): 50 — ABNORMAL LOW
GFR CALC AF AMER: 58 — AB
GLUCOSE: 76 mg/dL (ref 65–99)
Osmolality: 282 (ref 275–301)
Potassium: 3.7 mmol/L (ref 3.5–5.1)
SGPT (ALT): 14 U/L (ref 12–78)
SODIUM: 141 mmol/L (ref 136–145)
Total Protein: 7.5 g/dL (ref 6.4–8.2)

## 2014-01-10 LAB — CBC
HCT: 37.2 % (ref 35.0–47.0)
HGB: 12 g/dL (ref 12.0–16.0)
MCH: 30.4 pg (ref 26.0–34.0)
MCHC: 32.3 g/dL (ref 32.0–36.0)
MCV: 94 fL (ref 80–100)
Platelet: 209 10*3/uL (ref 150–440)
RBC: 3.96 10*6/uL (ref 3.80–5.20)
RDW: 16.5 % — ABNORMAL HIGH (ref 11.5–14.5)
WBC: 7.8 10*3/uL (ref 3.6–11.0)

## 2014-01-10 LAB — D-DIMER(ARMC): D-Dimer: 1992 ng/ml

## 2014-01-10 LAB — CK-MB: CK-MB: 3.1 ng/mL (ref 0.5–3.6)

## 2014-01-10 LAB — TROPONIN I
TROPONIN-I: 0.12 ng/mL — AB
Troponin-I: 0.23 ng/mL — ABNORMAL HIGH

## 2014-01-10 LAB — CK TOTAL AND CKMB (NOT AT ARMC)
CK, TOTAL: 166 U/L
CK-MB: 2.5 ng/mL (ref 0.5–3.6)

## 2014-01-11 DIAGNOSIS — I10 Essential (primary) hypertension: Secondary | ICD-10-CM

## 2014-01-11 DIAGNOSIS — I5042 Chronic combined systolic (congestive) and diastolic (congestive) heart failure: Secondary | ICD-10-CM

## 2014-01-11 DIAGNOSIS — I498 Other specified cardiac arrhythmias: Secondary | ICD-10-CM

## 2014-01-11 DIAGNOSIS — I059 Rheumatic mitral valve disease, unspecified: Secondary | ICD-10-CM

## 2014-01-11 DIAGNOSIS — I214 Non-ST elevation (NSTEMI) myocardial infarction: Secondary | ICD-10-CM

## 2014-01-11 LAB — BASIC METABOLIC PANEL
ANION GAP: 4 — AB (ref 7–16)
BUN: 13 mg/dL (ref 7–18)
CO2: 24 mmol/L (ref 21–32)
Calcium, Total: 8.6 mg/dL (ref 8.5–10.1)
Chloride: 113 mmol/L — ABNORMAL HIGH (ref 98–107)
Creatinine: 0.9 mg/dL (ref 0.60–1.30)
EGFR (African American): >60
GLUCOSE: 78 mg/dL (ref 65–99)
Osmolality: 280 (ref 275–301)
Potassium: 3.8 mmol/L (ref 3.5–5.1)
Sodium: 141 mmol/L (ref 136–145)

## 2014-01-11 LAB — CBC WITH DIFFERENTIAL/PLATELET
BASOS PCT: 0.5 %
Basophil #: 0 10*3/uL (ref 0.0–0.1)
Eosinophil #: 0.2 10*3/uL (ref 0.0–0.7)
Eosinophil %: 2.2 %
HCT: 35.6 % (ref 35.0–47.0)
HGB: 11.6 g/dL — AB (ref 12.0–16.0)
LYMPHS ABS: 1.9 10*3/uL (ref 1.0–3.6)
LYMPHS PCT: 27 %
MCH: 30.7 pg (ref 26.0–34.0)
MCHC: 32.5 g/dL (ref 32.0–36.0)
MCV: 94 fL (ref 80–100)
MONO ABS: 0.4 x10 3/mm (ref 0.2–0.9)
Monocyte %: 5.9 %
NEUTROS ABS: 4.5 10*3/uL (ref 1.4–6.5)
Neutrophil %: 64.4 %
PLATELETS: 196 10*3/uL (ref 150–440)
RBC: 3.77 10*6/uL — ABNORMAL LOW (ref 3.80–5.20)
RDW: 16.2 % — ABNORMAL HIGH (ref 11.5–14.5)
WBC: 7 10*3/uL (ref 3.6–11.0)

## 2014-01-11 LAB — CK-MB: CK-MB: 3.4 ng/mL (ref 0.5–3.6)

## 2014-01-11 LAB — TSH: Thyroid Stimulating Horm: 1.26 u[IU]/mL

## 2014-01-11 LAB — TROPONIN I: Troponin-I: 0.21 ng/mL — ABNORMAL HIGH

## 2014-01-11 LAB — MAGNESIUM: MAGNESIUM: 1.9 mg/dL

## 2014-01-12 ENCOUNTER — Encounter: Payer: Self-pay | Admitting: Cardiovascular Disease

## 2014-01-12 LAB — BASIC METABOLIC PANEL
Anion Gap: 6 — ABNORMAL LOW (ref 7–16)
BUN: 18 mg/dL (ref 7–18)
CHLORIDE: 105 mmol/L (ref 98–107)
CREATININE: 1.05 mg/dL (ref 0.60–1.30)
Calcium, Total: 9 mg/dL (ref 8.5–10.1)
Co2: 27 mmol/L (ref 21–32)
EGFR (African American): >60
GFR CALC NON AF AMER: 52 — AB
Glucose: 100 mg/dL — ABNORMAL HIGH (ref 65–99)
OSMOLALITY: 278 (ref 275–301)
POTASSIUM: 3.5 mmol/L (ref 3.5–5.1)
Sodium: 138 mmol/L (ref 136–145)

## 2014-01-13 ENCOUNTER — Emergency Department: Payer: Self-pay | Admitting: Emergency Medicine

## 2014-01-13 LAB — COMPREHENSIVE METABOLIC PANEL
ALT: 13 U/L (ref 12–78)
Albumin: 3.2 g/dL — ABNORMAL LOW (ref 3.4–5.0)
Alkaline Phosphatase: 69 U/L
Anion Gap: 6 — ABNORMAL LOW (ref 7–16)
BUN: 20 mg/dL — ABNORMAL HIGH (ref 7–18)
Bilirubin,Total: 0.5 mg/dL (ref 0.2–1.0)
CALCIUM: 9.5 mg/dL (ref 8.5–10.1)
Chloride: 100 mmol/L (ref 98–107)
Co2: 28 mmol/L (ref 21–32)
Creatinine: 1.47 mg/dL — ABNORMAL HIGH (ref 0.60–1.30)
EGFR (African American): 40 — ABNORMAL LOW
EGFR (Non-African Amer.): 35 — ABNORMAL LOW
Glucose: 115 mg/dL — ABNORMAL HIGH (ref 65–99)
Osmolality: 272 (ref 275–301)
Potassium: 3.5 mmol/L (ref 3.5–5.1)
SGOT(AST): 13 U/L — ABNORMAL LOW (ref 15–37)
Sodium: 134 mmol/L — ABNORMAL LOW (ref 136–145)
Total Protein: 8.5 g/dL — ABNORMAL HIGH (ref 6.4–8.2)

## 2014-01-13 LAB — CBC
HCT: 39.7 %
HGB: 13.3 g/dL
MCH: 30.8 pg
MCHC: 33.4 g/dL
MCV: 92 fL
Platelet: 211 10*3/uL
RBC: 4.31 X10 6/mm 3
RDW: 15.6 % — ABNORMAL HIGH
WBC: 8.2 10*3/uL

## 2014-01-13 LAB — TROPONIN I: Troponin-I: 0.04 ng/mL

## 2014-01-23 ENCOUNTER — Encounter: Payer: Self-pay | Admitting: Cardiovascular Disease

## 2014-01-23 ENCOUNTER — Ambulatory Visit (INDEPENDENT_AMBULATORY_CARE_PROVIDER_SITE_OTHER): Payer: Medicare Other | Admitting: Cardiovascular Disease

## 2014-01-23 VITALS — BP 132/64 | HR 107 | Ht 71.0 in | Wt 181.8 lb

## 2014-01-23 DIAGNOSIS — I471 Supraventricular tachycardia, unspecified: Secondary | ICD-10-CM

## 2014-01-23 DIAGNOSIS — I493 Ventricular premature depolarization: Secondary | ICD-10-CM

## 2014-01-23 DIAGNOSIS — R0602 Shortness of breath: Secondary | ICD-10-CM

## 2014-01-23 DIAGNOSIS — E785 Hyperlipidemia, unspecified: Secondary | ICD-10-CM

## 2014-01-23 DIAGNOSIS — F17200 Nicotine dependence, unspecified, uncomplicated: Secondary | ICD-10-CM

## 2014-01-23 DIAGNOSIS — I509 Heart failure, unspecified: Secondary | ICD-10-CM

## 2014-01-23 DIAGNOSIS — I5042 Chronic combined systolic (congestive) and diastolic (congestive) heart failure: Secondary | ICD-10-CM

## 2014-01-23 DIAGNOSIS — I498 Other specified cardiac arrhythmias: Secondary | ICD-10-CM

## 2014-01-23 DIAGNOSIS — I4949 Other premature depolarization: Secondary | ICD-10-CM

## 2014-01-23 DIAGNOSIS — E119 Type 2 diabetes mellitus without complications: Secondary | ICD-10-CM

## 2014-01-23 DIAGNOSIS — I10 Essential (primary) hypertension: Secondary | ICD-10-CM

## 2014-01-23 MED ORDER — DILTIAZEM HCL ER COATED BEADS 120 MG PO CP24: 120.0000 mg | ORAL_CAPSULE | Freq: Every day | ORAL | Status: DC

## 2014-01-23 NOTE — Assessment & Plan Note (Signed)
Metoprolol held for possible side effects, will start Cardizem 120 mg daily

## 2014-01-23 NOTE — Progress Notes (Signed)
Patient ID: [name] [name], female    DOB: 10/15/1935, 78 y.o.   MRN: [id number]  HPI Comments: [name] [name] is a 78 y.o. female with combined CHF (EF 93-90%, + diastolic dysfunction, multiple WMAs), HTN, HLD, COPD,  tobacco abuse, rheumatoid arthritis, borderline DM2 and GERD. She presents for routine followup. Recent lung nodule seen on CT scan of the chest, 1.7 cm ACE inhibitor cough  She had a recent hospital admission April 21 with discharge 01/12/2014. Diagnosed with SVT, non-ST elevation MI, cardiomyopathy with ejection fraction 30%, continued smoking. Heart rate initially measured at 170 beats per minute. Given adenosine. Troponin up to 0.23. Metoprolol was increased up to 50 mg daily. Also started on Cymbalta for depression. Depression exacerbated by loss of several family members. Imaging in the hospital also showed lung nodules recommendation suggested with primary care. 1.7 cm nodule anterior medial base of the left lower lobe   In followup today she reports having chronic headaches, chronic pain. Headaches started before her recent hospital admission. She wonders if it could be coming from the medications. Reports that her pain medication is not working. She denies having any further tachycardia concerning for SVT since her discharge  cardiac catheterization in 2008 which revealed normal cors. She was evaluated for tachycardia/palpitations at that time which were managed by AVN blockers.   Previously admitted to [facility] April 2014 for cough and shortness of breath attributed to bronchitis. Cardiology was consulted due to elevated cardiac enzymes (trend 0.31->0.61->0.23).   2D echo 01/18/2013: LVEF 30-35%, mod global LV systolic dysfunction, diastolic dysfunction, mid-distal anterior wall, apical and mid-apical inferior/posterior WMAs, mild MR/TR, normal RVSP.   Hgb A1C 7.3%,  LDL 96,  TC 147   She notes chronic DOE/SOB which she attributes to smoking and COPD. occasional  palpitations.   Daughter  passed away from lung cancer. Other family members with medical issues as well Staying with her son at nighttime   She continues to smoke 1-4 cigarettes/day as a stress coping mechanism. She would like to attempt quitting on her own.   EKG in the hospital showing normal sinus rhythm with PVCs EKG today shows sinus tachycardia with ease in a bigeminal pattern.  poor R-wave progression through the precordial leads, seen previously   Outpatient Encounter Prescriptions as of 01/23/2014  Medication Sig  . ALPRAZolam (XANAX) 0.25 MG tablet Take 0.25 mg by mouth 3 (three) times daily as needed.    Marland Kitchen aspirin 81 MG tablet Take 81 mg by mouth daily.  Marland Kitchen atorvastatin (LIPITOR) 20 MG tablet Take 20 mg by mouth daily.  . baclofen (LIORESAL) 10 MG tablet Take 5 mg by mouth 2 (two) times daily.  . DULoxetine (CYMBALTA) 20 MG capsule Take 20 mg by mouth daily.  . hydroxychloroquine (PLAQUENIL) 200 MG tablet Take by mouth daily.  Marland Kitchen LORazepam (ATIVAN) 1 MG tablet Take 1 mg by mouth 2 (two) times daily as needed for anxiety.  . metoprolol succinate (TOPROL-XL) 50 MG 24 hr tablet Take 50 mg by mouth daily. Take with or immediately following a meal.  . nitroGLYCERIN (NITROSTAT) 0.4 MG SL tablet Place 0.4 mg under the tongue every 5 (five) minutes as needed for chest pain.  Marland Kitchen omeprazole (PRILOSEC) 40 MG capsule Take 40 mg by mouth daily.  . ondansetron (ZOFRAN-ODT) 4 MG disintegrating tablet Take 4 mg by mouth every 8 (eight) hours as needed for nausea or vomiting.   Review of Systems  Constitutional: Negative.   HENT: Negative.   Eyes: Negative.  Respiratory: Positive for cough.   Cardiovascular: Negative.   Gastrointestinal: Negative.   Endocrine: Negative.   Musculoskeletal: Negative.   Skin: Negative.   Allergic/Immunologic: Negative.   Neurological: Negative.   Hematological: Negative.   Psychiatric/Behavioral: Negative.   All other systems reviewed and are  negative.   BP 132/64  Pulse 107  Ht 5\' 11"  (1.803 m)  Wt 181 lb 12 oz (82.441 kg)  BMI 25.36 kg/m2  Physical Exam  Nursing note and vitals reviewed. Constitutional: She is oriented to person, place, and time. She appears well-developed and well-nourished.  HENT:  Head: Normocephalic.  Nose: Nose normal.  Mouth/Throat: Oropharynx is clear and moist.  Eyes: Conjunctivae are normal. Pupils are equal, round, and reactive to light.  Neck: Normal range of motion. Neck supple. No JVD present.  Cardiovascular: Normal rate, regular rhythm, S1 normal, S2 normal, normal heart sounds and intact distal pulses.  Exam reveals no gallop and no friction rub.   No murmur heard. Pulmonary/Chest: Effort normal and breath sounds normal. No respiratory distress. She has no wheezes. She has no rales. She exhibits no tenderness.  Abdominal: Soft. Bowel sounds are normal. She exhibits no distension. There is no tenderness.  Musculoskeletal: Normal range of motion. She exhibits no edema and no tenderness.  Lymphadenopathy:    She has no cervical adenopathy.  Neurological: She is alert and oriented to person, place, and time. Coordination normal.  Skin: Skin is warm and dry. No rash noted. No erythema.  Psychiatric: She has a normal mood and affect. Her behavior is normal. Judgment and thought content normal.    Assessment and Plan

## 2014-01-23 NOTE — Assessment & Plan Note (Signed)
We have encouraged her to continue to work on weaning her cigarettes and smoking cessation. She will continue to work on this and does not want any assistance with chantix.  

## 2014-01-23 NOTE — Assessment & Plan Note (Signed)
We have encouraged continued exercise, careful diet management in an effort to lose weight. 

## 2014-01-23 NOTE — Assessment & Plan Note (Signed)
Previously seen asymptomatic PVCs. No improvement on higher dose metoprolol. She is asymptomatic. No further workup at this time. She does not complain of shortness of breath, no signs of CHF.

## 2014-01-23 NOTE — Patient Instructions (Addendum)
When you run out of your metoprolol Stop the medication Start cardizem /diltiazem one a day   Please call us if you have new issues that need to be addressed before your next appt.  Your physician wants you to follow-up in: 3 months.  You will receive a reminder letter in the mail two months in advance. If you don't receive a letter, please call our office to schedule the follow-up appointment.

## 2014-01-23 NOTE — Assessment & Plan Note (Signed)
She appears relatively euvolemic on today's visit. Medication changes as above.

## 2014-01-23 NOTE — Assessment & Plan Note (Signed)
Recent episode of SVT. The Toprol was increased. She is complaining about anorexia, headaches. Likely from chronic pain issues though we have recommended she try to change her metoprolol to diltiazem 120 mg daily, extended release. We will see if this helps alleviate some of her symptoms. If no change, we could go back on the Toprol or stay on diltiazem. Either could be titrated upwards for better heart rate control.

## 2014-01-23 NOTE — Assessment & Plan Note (Signed)
Encouraged her to stay on her Lipitor 

## 2014-01-30 ENCOUNTER — Telehealth: Payer: Self-pay

## 2014-01-30 NOTE — Telephone Encounter (Signed)
Spoke w/ pt.  She reports that she was reading the enclosed information for her diltiazem that was prescribed at last ov 01/23/14. She would like it documented that she "refuse to take this medicine.  There are too many side effects and it will kill you." Pt is concerned about the amount of serious side effects and states that she has never heard of this med. Advised pt that it is her right to refuse to take medication.   She would like to know if Dr. Rockey Situ recommend something "safer". Please advise. Thank you.

## 2014-01-30 NOTE — Telephone Encounter (Signed)
Pt called and has questions regarding her medication.

## 2014-01-31 ENCOUNTER — Telehealth: Payer: Self-pay | Admitting: *Deleted

## 2014-01-31 ENCOUNTER — Encounter: Payer: Medicare Other | Admitting: Thoracic Surgery (Cardiothoracic Vascular Surgery)

## 2014-01-31 NOTE — Telephone Encounter (Signed)
I called her to see if she knew that she had been referred to [place] and had an appointment today.  She said she did, but wanted to cancel her appointment and not reschedule because she was going to get a second opinion. I informed Dr, Roxan Hockey and he asked that I inform the referring MD also which will be done.

## 2014-01-31 NOTE — Telephone Encounter (Signed)
It Is a very safe medication that we use frequently If she is concerned about this medication, she could restart the metoprolol/Toprol? She was on this previously

## 2014-01-31 NOTE — Telephone Encounter (Signed)
Spoke w/ pt.  She states that she would like to continue on her metoprolol. She will call w/ any further questions or concerns.

## 2014-02-18 ENCOUNTER — Emergency Department: Payer: Self-pay | Admitting: Emergency Medicine

## 2014-02-18 LAB — COMPREHENSIVE METABOLIC PANEL
ALBUMIN: 3 g/dL — AB (ref 3.4–5.0)
Alkaline Phosphatase: 64 U/L
Anion Gap: 8 (ref 7–16)
BILIRUBIN TOTAL: 0.2 mg/dL (ref 0.2–1.0)
BUN: 17 mg/dL (ref 7–18)
CO2: 24 mmol/L (ref 21–32)
Calcium, Total: 8.6 mg/dL (ref 8.5–10.1)
Chloride: 107 mmol/L (ref 98–107)
Creatinine: 1.14 mg/dL (ref 0.60–1.30)
EGFR (Non-African Amer.): 47 — ABNORMAL LOW
GFR CALC AF AMER: 54 — AB
Glucose: 139 mg/dL — ABNORMAL HIGH (ref 65–99)
Osmolality: 281 (ref 275–301)
POTASSIUM: 3.5 mmol/L (ref 3.5–5.1)
SGOT(AST): 14 U/L — ABNORMAL LOW (ref 15–37)
SGPT (ALT): 9 U/L — ABNORMAL LOW (ref 12–78)
Sodium: 139 mmol/L (ref 136–145)
Total Protein: 7.6 g/dL (ref 6.4–8.2)

## 2014-02-18 LAB — TROPONIN I: Troponin-I: <0.02 ng/mL

## 2014-02-18 LAB — CBC WITH DIFFERENTIAL/PLATELET
Basophil #: 0.1 10*3/uL (ref 0.0–0.1)
Basophil %: 0.6 %
EOS PCT: 3.6 %
Eosinophil #: 0.4 10*3/uL (ref 0.0–0.7)
HCT: 39.5 % (ref 35.0–47.0)
HGB: 12.6 g/dL (ref 12.0–16.0)
Lymphocyte #: 2.9 10*3/uL (ref 1.0–3.6)
Lymphocyte %: 25.1 %
MCH: 29.5 pg (ref 26.0–34.0)
MCHC: 31.8 g/dL — ABNORMAL LOW (ref 32.0–36.0)
MCV: 93 fL (ref 80–100)
MONO ABS: 0.7 x10 3/mm (ref 0.2–0.9)
Monocyte %: 5.8 %
NEUTROS ABS: 7.5 10*3/uL — AB (ref 1.4–6.5)
Neutrophil %: 64.9 %
PLATELETS: 202 10*3/uL (ref 150–440)
RBC: 4.26 10*6/uL (ref 3.80–5.20)
RDW: 14.2 % (ref 11.5–14.5)
WBC: 11.5 10*3/uL — ABNORMAL HIGH (ref 3.6–11.0)

## 2014-02-18 LAB — URINALYSIS, COMPLETE
BILIRUBIN, UR: NEGATIVE
Blood: NEGATIVE
Glucose,UR: NEGATIVE mg/dL (ref 0–75)
KETONE: NEGATIVE
Leukocyte Esterase: NEGATIVE
Nitrite: NEGATIVE
PROTEIN: NEGATIVE
Ph: 6 (ref 4.5–8.0)
RBC,UR: NONE SEEN /HPF (ref 0–5)
SPECIFIC GRAVITY: 1.005 (ref 1.003–1.030)
Squamous Epithelial: 3
WBC UR: 1 /HPF (ref 0–5)

## 2014-02-18 LAB — PRO B NATRIURETIC PEPTIDE: B-Type Natriuretic Peptide: 148 pg/mL (ref 0–450)

## 2014-02-24 ENCOUNTER — Encounter: Payer: Self-pay | Admitting: Cardiovascular Disease

## 2014-02-24 ENCOUNTER — Ambulatory Visit (INDEPENDENT_AMBULATORY_CARE_PROVIDER_SITE_OTHER): Payer: Medicare Other | Admitting: Cardiovascular Disease

## 2014-02-24 VITALS — BP 140/82 | HR 71 | Ht 71.0 in | Wt 181.5 lb

## 2014-02-24 DIAGNOSIS — I498 Other specified cardiac arrhythmias: Secondary | ICD-10-CM

## 2014-02-24 DIAGNOSIS — I10 Essential (primary) hypertension: Secondary | ICD-10-CM

## 2014-02-24 DIAGNOSIS — E785 Hyperlipidemia, unspecified: Secondary | ICD-10-CM

## 2014-02-24 DIAGNOSIS — F17200 Nicotine dependence, unspecified, uncomplicated: Secondary | ICD-10-CM

## 2014-02-24 DIAGNOSIS — I471 Supraventricular tachycardia, unspecified: Secondary | ICD-10-CM

## 2014-02-24 DIAGNOSIS — I493 Ventricular premature depolarization: Secondary | ICD-10-CM

## 2014-02-24 DIAGNOSIS — I4949 Other premature depolarization: Secondary | ICD-10-CM

## 2014-02-24 NOTE — Assessment & Plan Note (Signed)
Encouraged her to stay on her Lipitor 

## 2014-02-24 NOTE — Patient Instructions (Signed)
You are doing well.  Please take diltiazem once a day for tachycardia Ok to take metoprolol as needed for tachycardia episodes  Please call us if you have new issues that need to be addressed before your next appt.  Your physician wants you to follow-up in: 6 months.  You will receive a reminder letter in the mail two months in advance. If you don't receive a letter, please call our office to schedule the follow-up appointment.

## 2014-02-24 NOTE — Progress Notes (Signed)
Patient ID: [name] [name], female    DOB: 1936-04-20, 78 y.o.   MRN: [id number]  HPI Comments: [name] [name] is a 78 y.o. female with long history of smoking who continues to smoke, combined CHF (EF 41-28%, + diastolic dysfunction, multiple WMAs), HTN, HLD, COPD,  tobacco abuse, rheumatoid arthritis, borderline DM2 and GERD. She presents for routine followup.  lung nodule seen on CT scan of the chest, 1.7 cm ACE inhibitor cough  hospital admission April 21 with discharge 01/12/2014. Diagnosed with SVT, non-ST elevation MI, cardiomyopathy with ejection fraction 30%,   Heart rate initially measured at 170 beats per minute. Given adenosine. Troponin up to 0.23. Metoprolol was increased up to 50 mg daily.  Also started on Cymbalta for depression. Depression exacerbated by loss of several family members.  Imaging in the hospital also showed lung nodules recommendation suggested with primary care. 1.7 cm nodule anterior medial base of the left lower lobe  On her last clinic visit, she was having chronic headaches and wanted to change the metoprolol. Denies having any further episodes of tachycardia. She was changed to diltiazem 120 mg daily. She did not start the medication as she was afraid of the side effects. She stayed on the metoprolol. Recent recurrence of her SVT may 32,015 with heart rate up to 200 beats per minute requiring adenosine. Evaluation in the emergency room that evening and discharged home after negative cardiac enzymes She felt that she was having an anxiety attack. Notes indicate she converted to normal sinus rhythm after adenosine was given confirming SVT.  cardiac catheterization in 2008 which revealed normal cors. She was evaluated for tachycardia/palpitations at that time which were managed by AVN blockers.  Previously admitted to [facility] April 2014 for cough and shortness of breath attributed to bronchitis. Cardiology was consulted due to elevated cardiac enzymes (trend  0.31->0.61->0.23).   2D echo 01/18/2013: LVEF 30-35%, mod global LV systolic dysfunction, diastolic dysfunction, mid-distal anterior wall, apical and mid-apical inferior/posterior WMAs, mild MR/TR, normal RVSP.   Hgb A1C 7.3%,  LDL 96,  TC 147   She notes chronic DOE/SOB which she attributes to smoking and COPD. occasional palpitations.   Daughter  passed away from lung cancer. Other family members with medical issues as well Staying with her son at nighttime   She continues to smoke 1-4 cigarettes/day as a stress coping mechanism.   EKG today shows sinus rhythm with rate 71 beats per minute, right bundle branch block with left anterior fascicular block   Outpatient Encounter Prescriptions as of 02/24/2014  Medication Sig  . atorvastatin (LIPITOR) 20 MG tablet Take 20 mg by mouth daily.  . baclofen (LIORESAL) 10 MG tablet Take 5 mg by mouth 2 (two) times daily.  . diclofenac (VOLTAREN) 50 MG EC tablet Take 50 mg by mouth daily.  . lansoprazole (PREVACID) 30 MG capsule Take 30 mg by mouth daily at 12 noon.  . metoprolol succinate (TOPROL-XL) 50 MG 24 hr tablet Take 50 mg by mouth daily. Take with or immediately following a meal.  . nitroGLYCERIN (NITROSTAT) 0.4 MG SL tablet Place 0.4 mg under the tongue every 5 (five) minutes as needed for chest pain.  Marland Kitchen ondansetron (ZOFRAN-ODT) 4 MG disintegrating tablet Take 4 mg by mouth every 8 (eight) hours as needed for nausea or vomiting.  Marland Kitchen PARoxetine (PAXIL) 10 MG tablet Take 10 mg by mouth daily.  Marland Kitchen diltiazem (CARDIZEM SR) 120 MG 12 hr capsule Take 120 mg by mouth daily. She's not taking this  Review of Systems  Constitutional: Negative.   HENT: Negative.   Eyes: Negative.   Respiratory: Negative.   Cardiovascular: Negative.        Tachycardia  Gastrointestinal: Negative.   Endocrine: Negative.   Musculoskeletal: Negative.   Skin: Negative.   Allergic/Immunologic: Negative.   Neurological: Negative.   Hematological: Negative.    Psychiatric/Behavioral: The patient is nervous/anxious.   All other systems reviewed and are negative.   BP 140/82  Pulse 71  Ht 5\' 11"  (1.803 m)  Wt 181 lb 8 oz (82.328 kg)  BMI 25.33 kg/m2  Physical Exam  Nursing note and vitals reviewed. Constitutional: She is oriented to person, place, and time. She appears well-developed and well-nourished.  HENT:  Head: Normocephalic.  Nose: Nose normal.  Mouth/Throat: Oropharynx is clear and moist.  Eyes: Conjunctivae are normal. Pupils are equal, round, and reactive to light.  Neck: Normal range of motion. Neck supple. No JVD present.  Cardiovascular: Normal rate, regular rhythm, S1 normal, S2 normal, normal heart sounds and intact distal pulses.  Exam reveals no gallop and no friction rub.   No murmur heard. Pulmonary/Chest: Effort normal and breath sounds normal. No respiratory distress. She has no wheezes. She has no rales. She exhibits no tenderness.  Abdominal: Soft. Bowel sounds are normal. She exhibits no distension. There is no tenderness.  Musculoskeletal: Normal range of motion. She exhibits no edema and no tenderness.  Lymphadenopathy:    She has no cervical adenopathy.  Neurological: She is alert and oriented to person, place, and time. Coordination normal.  Skin: Skin is warm and dry. No rash noted. No erythema.  Psychiatric: She has a normal mood and affect. Her behavior is normal. Judgment and thought content normal.    Assessment and Plan

## 2014-02-24 NOTE — Assessment & Plan Note (Addendum)
Hospital records were reviewed from Feb 18, 2014. 2 episodes of SVT in the past month. She continues on metoprolol. She did not want to take diltiazem. Continues to have breakthrough on the metoprolol. We have recommended she try diltiazem 120 mg daily. If she has breakthrough, could try to add amiodarone. She does not want ablation at this time.

## 2014-02-24 NOTE — Assessment & Plan Note (Signed)
We have encouraged her to continue to work on weaning her cigarettes and smoking cessation. She will continue to work on this and does not want any assistance with chantix.  

## 2014-02-24 NOTE — Assessment & Plan Note (Signed)
Recommended she hold metoprolol and try diltiazem 120 mg daily

## 2014-03-02 ENCOUNTER — Emergency Department: Payer: Self-pay

## 2014-03-02 LAB — CBC WITH DIFFERENTIAL/PLATELET
BASOS PCT: 0.3 %
Basophil #: 0 10*3/uL (ref 0.0–0.1)
EOS PCT: 2.5 %
Eosinophil #: 0.3 10*3/uL (ref 0.0–0.7)
HCT: 37.2 % (ref 35.0–47.0)
HGB: 12.1 g/dL (ref 12.0–16.0)
LYMPHS ABS: 2.8 10*3/uL (ref 1.0–3.6)
Lymphocyte %: 23.9 %
MCH: 30.2 pg (ref 26.0–34.0)
MCHC: 32.5 g/dL (ref 32.0–36.0)
MCV: 93 fL (ref 80–100)
MONO ABS: 0.8 x10 3/mm (ref 0.2–0.9)
MONOS PCT: 6.7 %
NEUTROS ABS: 7.9 10*3/uL — AB (ref 1.4–6.5)
Neutrophil %: 66.6 %
PLATELETS: 210 10*3/uL (ref 150–440)
RBC: 4.01 10*6/uL (ref 3.80–5.20)
RDW: 14.2 % (ref 11.5–14.5)
WBC: 11.9 10*3/uL — AB (ref 3.6–11.0)

## 2014-03-02 LAB — URINALYSIS, COMPLETE
BILIRUBIN, UR: NEGATIVE
Blood: NEGATIVE
GLUCOSE, UR: NEGATIVE mg/dL (ref 0–75)
KETONE: NEGATIVE
Nitrite: NEGATIVE
Ph: 6 (ref 4.5–8.0)
Protein: NEGATIVE
RBC,UR: 4 /HPF (ref 0–5)
Specific Gravity: 1.017 (ref 1.003–1.030)
Squamous Epithelial: 3

## 2014-03-02 LAB — COMPREHENSIVE METABOLIC PANEL
AST: 18 U/L (ref 15–37)
Albumin: 2.9 g/dL — ABNORMAL LOW (ref 3.4–5.0)
Alkaline Phosphatase: 73 U/L
Anion Gap: 3 — ABNORMAL LOW (ref 7–16)
BILIRUBIN TOTAL: 0.2 mg/dL (ref 0.2–1.0)
BUN: 14 mg/dL (ref 7–18)
CREATININE: 1.34 mg/dL — AB (ref 0.60–1.30)
Calcium, Total: 8.5 mg/dL (ref 8.5–10.1)
Chloride: 109 mmol/L — ABNORMAL HIGH (ref 98–107)
Co2: 28 mmol/L (ref 21–32)
EGFR (African American): 45 — ABNORMAL LOW
GFR CALC NON AF AMER: 39 — AB
Glucose: 93 mg/dL (ref 65–99)
Osmolality: 280 (ref 275–301)
POTASSIUM: 4.2 mmol/L (ref 3.5–5.1)
SGPT (ALT): 14 U/L (ref 12–78)
Sodium: 140 mmol/L (ref 136–145)
TOTAL PROTEIN: 7.5 g/dL (ref 6.4–8.2)

## 2014-03-02 LAB — TROPONIN I
Troponin-I: <0.02 ng/mL
Troponin-I: <0.02 ng/mL

## 2014-03-02 LAB — LIPASE, BLOOD: LIPASE: 208 U/L (ref 73–393)

## 2014-04-25 ENCOUNTER — Ambulatory Visit: Payer: Medicare Other | Admitting: Cardiovascular Disease

## 2014-06-02 ENCOUNTER — Emergency Department: Payer: Self-pay | Admitting: Emergency Medicine

## 2014-06-02 LAB — URINALYSIS, COMPLETE
Bacteria: NONE SEEN
Bilirubin,UR: NEGATIVE
GLUCOSE, UR: NEGATIVE mg/dL (ref 0–75)
Ketone: NEGATIVE
LEUKOCYTE ESTERASE: NEGATIVE
NITRITE: NEGATIVE
Ph: 6 (ref 4.5–8.0)
Protein: NEGATIVE
Specific Gravity: 1.012 (ref 1.003–1.030)
Squamous Epithelial: NONE SEEN

## 2014-06-02 LAB — COMPREHENSIVE METABOLIC PANEL
ANION GAP: 6 — AB (ref 7–16)
Albumin: 3.2 g/dL — ABNORMAL LOW (ref 3.4–5.0)
Alkaline Phosphatase: 85 U/L
BUN: 18 mg/dL (ref 7–18)
Bilirubin,Total: 0.4 mg/dL (ref 0.2–1.0)
CHLORIDE: 108 mmol/L — AB (ref 98–107)
Calcium, Total: 8.9 mg/dL (ref 8.5–10.1)
Co2: 25 mmol/L (ref 21–32)
Creatinine: 1.12 mg/dL (ref 0.60–1.30)
EGFR (Non-African Amer.): 48 — ABNORMAL LOW
GFR CALC AF AMER: 55 — AB
Glucose: 123 mg/dL — ABNORMAL HIGH (ref 65–99)
OSMOLALITY: 281 (ref 275–301)
POTASSIUM: 3.5 mmol/L (ref 3.5–5.1)
SGOT(AST): 21 U/L (ref 15–37)
SGPT (ALT): 18 U/L
Sodium: 139 mmol/L (ref 136–145)
Total Protein: 7.8 g/dL (ref 6.4–8.2)

## 2014-06-02 LAB — CBC WITH DIFFERENTIAL/PLATELET
BASOS ABS: 0 10*3/uL (ref 0.0–0.1)
BASOS PCT: 0.4 %
EOS PCT: 1.9 %
Eosinophil #: 0.2 10*3/uL (ref 0.0–0.7)
HCT: 37.9 % (ref 35.0–47.0)
HGB: 12.1 g/dL (ref 12.0–16.0)
Lymphocyte #: 1.8 10*3/uL (ref 1.0–3.6)
Lymphocyte %: 17.8 %
MCH: 29.6 pg (ref 26.0–34.0)
MCHC: 31.9 g/dL — ABNORMAL LOW (ref 32.0–36.0)
MCV: 93 fL (ref 80–100)
Monocyte #: 0.8 x10 3/mm (ref 0.2–0.9)
Monocyte %: 7.9 %
NEUTROS PCT: 72 %
Neutrophil #: 7.1 10*3/uL — ABNORMAL HIGH (ref 1.4–6.5)
Platelet: 208 10*3/uL (ref 150–440)
RBC: 4.09 10*6/uL (ref 3.80–5.20)
RDW: 15.4 % — AB (ref 11.5–14.5)
WBC: 9.8 10*3/uL (ref 3.6–11.0)

## 2014-06-03 LAB — URINE CULTURE

## 2014-06-06 ENCOUNTER — Encounter: Payer: Self-pay | Admitting: Nurse Practitioner

## 2014-06-06 ENCOUNTER — Observation Stay: Payer: Self-pay | Admitting: Internal Medicine

## 2014-06-06 ENCOUNTER — Other Ambulatory Visit: Payer: Self-pay | Admitting: Nurse Practitioner

## 2014-06-06 DIAGNOSIS — E785 Hyperlipidemia, unspecified: Secondary | ICD-10-CM

## 2014-06-06 DIAGNOSIS — I10 Essential (primary) hypertension: Secondary | ICD-10-CM

## 2014-06-06 DIAGNOSIS — I428 Other cardiomyopathies: Secondary | ICD-10-CM

## 2014-06-06 DIAGNOSIS — I471 Supraventricular tachycardia: Secondary | ICD-10-CM

## 2014-06-06 LAB — COMPREHENSIVE METABOLIC PANEL
ALK PHOS: 98 U/L
ANION GAP: 7 (ref 7–16)
Albumin: 2.9 g/dL — ABNORMAL LOW (ref 3.4–5.0)
BUN: 24 mg/dL — ABNORMAL HIGH (ref 7–18)
Bilirubin,Total: 0.3 mg/dL (ref 0.2–1.0)
CO2: 23 mmol/L (ref 21–32)
Calcium, Total: 8.5 mg/dL (ref 8.5–10.1)
Chloride: 110 mmol/L — ABNORMAL HIGH (ref 98–107)
Creatinine: 1.31 mg/dL — ABNORMAL HIGH (ref 0.60–1.30)
EGFR (African American): 46 — ABNORMAL LOW
EGFR (Non-African Amer.): 39 — ABNORMAL LOW
Glucose: 157 mg/dL — ABNORMAL HIGH (ref 65–99)
Osmolality: 287 (ref 275–301)
Potassium: 4.9 mmol/L (ref 3.5–5.1)
SGOT(AST): 74 U/L — ABNORMAL HIGH (ref 15–37)
SGPT (ALT): 41 U/L
SODIUM: 140 mmol/L (ref 136–145)
Total Protein: 7.9 g/dL (ref 6.4–8.2)

## 2014-06-06 LAB — URINALYSIS, COMPLETE
Bilirubin,UR: NEGATIVE
Blood: NEGATIVE
Glucose,UR: NEGATIVE mg/dL (ref 0–75)
KETONE: NEGATIVE
LEUKOCYTE ESTERASE: NEGATIVE
Nitrite: NEGATIVE
Ph: 6 (ref 4.5–8.0)
Protein: NEGATIVE
Specific Gravity: 1.015 (ref 1.003–1.030)
Squamous Epithelial: 3
WBC UR: 2 /HPF (ref 0–5)

## 2014-06-06 LAB — CBC
HCT: 38.2 % (ref 35.0–47.0)
HGB: 12 g/dL (ref 12.0–16.0)
MCH: 29.5 pg (ref 26.0–34.0)
MCHC: 31.5 g/dL — ABNORMAL LOW (ref 32.0–36.0)
MCV: 94 fL (ref 80–100)
PLATELETS: 243 10*3/uL (ref 150–440)
RBC: 4.08 10*6/uL (ref 3.80–5.20)
RDW: 15.4 % — ABNORMAL HIGH (ref 11.5–14.5)
WBC: 10.9 10*3/uL (ref 3.6–11.0)

## 2014-06-06 LAB — MAGNESIUM: Magnesium: 2.2 mg/dL

## 2014-06-06 LAB — TROPONIN I
Troponin-I: 0.02 ng/mL
Troponin-I: <0.02 ng/mL

## 2014-06-06 LAB — CK-MB
CK-MB: 0.5 ng/mL (ref 0.5–3.6)
CK-MB: 0.8 ng/mL (ref 0.5–3.6)

## 2014-06-06 LAB — CK TOTAL AND CKMB (NOT AT ARMC)
CK, Total: 113 U/L
CK-MB: <0.5 ng/mL — ABNORMAL LOW (ref 0.5–3.6)

## 2014-06-07 ENCOUNTER — Telehealth: Payer: Self-pay

## 2014-06-07 LAB — CULTURE, BLOOD (SINGLE)

## 2014-06-07 NOTE — Telephone Encounter (Signed)
Attempted to contact pt regarding discharge from [facility] on 06/06/14.  No answer, no machine.

## 2014-06-08 NOTE — Telephone Encounter (Signed)
2nd attempt to contact pt regarding discharge from [facility] on 06/06/14. No answer, no machine.

## 2014-06-13 ENCOUNTER — Encounter: Payer: Self-pay | Admitting: Internal Medicine

## 2014-06-14 ENCOUNTER — Ambulatory Visit (INDEPENDENT_AMBULATORY_CARE_PROVIDER_SITE_OTHER): Payer: Medicare Other | Admitting: Physician Assistant

## 2014-06-14 ENCOUNTER — Encounter: Payer: Self-pay | Admitting: Physician Assistant

## 2014-06-14 VITALS — BP 152/88 | HR 86 | Ht 71.0 in | Wt 188.0 lb

## 2014-06-14 DIAGNOSIS — I5042 Chronic combined systolic (congestive) and diastolic (congestive) heart failure: Secondary | ICD-10-CM

## 2014-06-14 DIAGNOSIS — Z720 Tobacco use: Secondary | ICD-10-CM

## 2014-06-14 DIAGNOSIS — I428 Other cardiomyopathies: Secondary | ICD-10-CM

## 2014-06-14 DIAGNOSIS — E785 Hyperlipidemia, unspecified: Secondary | ICD-10-CM

## 2014-06-14 DIAGNOSIS — I471 Supraventricular tachycardia: Secondary | ICD-10-CM

## 2014-06-14 DIAGNOSIS — F17200 Nicotine dependence, unspecified, uncomplicated: Secondary | ICD-10-CM

## 2014-06-14 DIAGNOSIS — I498 Other specified cardiac arrhythmias: Secondary | ICD-10-CM

## 2014-06-14 DIAGNOSIS — I509 Heart failure, unspecified: Secondary | ICD-10-CM

## 2014-06-14 DIAGNOSIS — R002 Palpitations: Secondary | ICD-10-CM

## 2014-06-14 DIAGNOSIS — I10 Essential (primary) hypertension: Secondary | ICD-10-CM

## 2014-06-14 DIAGNOSIS — R42 Dizziness and giddiness: Secondary | ICD-10-CM

## 2014-06-14 MED ORDER — LOSARTAN POTASSIUM 50 MG PO TABS: 50.0000 mg | ORAL_TABLET | Freq: Every day | ORAL | Status: DC

## 2014-06-14 MED ORDER — FUROSEMIDE 20 MG PO TABS: ORAL_TABLET | ORAL | Status: DC

## 2014-06-14 NOTE — Progress Notes (Signed)
Patient Name: [name] [name], [name] [name] [birth year], MRN [id number]  Date of Encounter: 06/14/2014  Primary Care Provider:  Dr. Dollene Primrose, MD Primary Cardiologist:  Dr. Rockey Situ, MD  Patient Profile:  78 y.o. female with history of paroxysmal SVT and NICM who was recently admitted to [facility] from for one day on 06/06/14 for SVT, (rates not documented) is here for hospital follow up.     Problem List:   Past Medical History  Diagnosis Date  . Hypertension   . Hyperlipidemia   . COPD (chronic obstructive pulmonary disease)     a. With ongoing tobacco use.  Marland Kitchen GERD (gastroesophageal reflux disease)   . Anxiety   . Rheumatoid arthritis   . Osteoarthritis   . Hyperuricemia   . Diabetes mellitus without complication   . Chronic combined systolic and diastolic CHF, NYHA class 1     a. 12/2013 Echo: EF 30-35%, DD, mid-distal anterior wall, apical and mid-apical inferior/posterior WMAs, mild MR/TR, normal RVSP.   Marland Kitchen Renal insufficiency   . Thyroid nodule   . Lung nodule   . Tobacco abuse   . S/P cardiac cath     a. 2008 Cath: nl cors.  . SVT (supraventricular tachycardia)     a. 12/2013 & 01/2014-->broke with adenosine.  . Pulmonary nodule, left     a.  H/O 1.7 cm nodule anterior medial base of the left lower lobe, not seen on 12/2013 CXR [facility]).   Past Surgical History  Procedure Laterality Date  . Bowel obstruction    . Tubal ligation    . Cardiac catheterization  2008    [facility]     Allergies:  Allergies  Allergen Reactions  . Benadryl [Diphenhydramine Hcl]   . Lisinopril Cough  . Sulfa Antibiotics Hives     HPI:  78 y.o. female with the above problem list here for hospital follow up for one day admission for SVT.    Patient officially diagnosed with paroxysmal SVT (HR 170s) April 2015 during hospital admission at [facility] in the setting of demand ischemia (TnI 0.12-->0.23-->0.23), & cardiomyopathy with EF 30%. However she has had tachypalpitations dating back to 2014.   In  April 2015, she responded well to adenosine administered via EMS and converted to NSR. Her work up included: D dimer that was elevated. CTA of the chest did not show any evidence of PE but did show a 1.7 cm pulmonary nodule @ the anterior medial base of the left lower lobe and a 3 cm nodule of the left lobe of the thyroid. TSH and lytes were nl. She remained chest pain free in NSR and HR in the 90s. During that admission her metoprolol, which she was previously on for tachypalpitations dating back to 2014, was increased to Toprol XL 50 mg daily.  She again had a recurrence of SVT 01/2014 with HR up to 200 which responded to adenosine-->NSR. CE negative.   Prior to the May SVT episode she was changed from Toprol XL 50 mg to diltiazem 120 mg daily 2/2 patient request; however, she did not start the diltiazem as she was afraid of the side effects - she stayed on the metoprolol.   She has been reluctant to consider RFCA as a treatment option in the past.   This has been her third hospital visit for adenosine sensitive paroxysmal SVT since April. She again converted to NSR with adenosine x 2 then diltiazem 5 mg IV x 1 with brief return to NSR then return to SVT. She  was taken to [facility] ED where she was given further doses of adenosine and converted to NSR. She was admitted for r/o MI. Per [facility] note she was taking Toprol XL 25 mg bid upon admission though there is no documentation of medication change in her chart prior to this. Her Toprol XL was increased to 50 mg BID and she was interested in RFCA at that time after learning more about it. Antiarrhythmic tx was held off 2/2 possible RFCA.   Today she comes in stating she feels well. No further episodes of SVT since her hospital admission. She relates her dizziness/swimmyheadedness that she spoke of during triage to when she goes outdoors in the sun without her sunglasses and this is not new for her. She is not taking Toprol 50, but rather 25 as she does not  tolerate 50 mg. She is not taking diltiazem 2/2 she does not tolerate it. She is hesitant to proceed with RFCA but would like to discuss this with Dr. Rayann Heman when her son can make the appointment. She notes some abdominal fullness. No LEE. Weight at hospital admission/discharge 182, currently 188. No cough, SOB, DOE, chest pain, orthopnea, PND, or increased dyspnea.   She relates her SVT to increased stressors in her life and states if she could keep the stress down she would be fine. Sadly, she has experienced a lot of death in her family lately and this is weighing on her. She is keeping her spirits up though.    Home Medications:  Prior to Admission medications   Medication Sig Start Date End Date Taking? Authorizing Provider  atorvastatin (LIPITOR) 20 MG tablet Take 20 mg by mouth daily.   Yes Historical Provider, MD  baclofen (LIORESAL) 10 MG tablet Take 5 mg by mouth 2 (two) times daily.   No Historical Provider, MD  diclofenac (VOLTAREN) 50 MG EC tablet Take 50 mg by mouth daily.   Yes Historical Provider, MD  diltiazem (CARDIZEM SR) 120 MG 12 hr capsule Take 120 mg by mouth daily.   No Historical Provider, MD  lansoprazole (PREVACID) 30 MG capsule Take 30 mg by mouth daily at 12 noon.   No Historical Provider, MD  metoprolol succinate (TOPROL-XL) 25 MG 24 hr tablet Take 50 mg by mouth daily. Take with or immediately following a meal.   Yes Historical Provider, MD  nitroGLYCERIN (NITROSTAT) 0.4 MG SL tablet Place 0.4 mg under the tongue every 5 (five) minutes as needed for chest pain.   Yes Historical Provider, MD  ondansetron (ZOFRAN-ODT) 4 MG disintegrating tablet Take 4 mg by mouth every 8 (eight) hours as needed for nausea or vomiting.   Yes Historical Provider, MD  PARoxetine (PAXIL) 10 MG tablet Take 10 mg by mouth daily.   Yes Historical Provider, MD     Weights: Filed Weights   06/14/14 1315  Weight: 188 lb (85.276 kg)     Review of Systems:  All other systems reviewed  and are otherwise negative except as noted above.  Physical Exam:  Blood pressure 152/88, pulse 86, height 5\' 11"  (1.803 m), weight 188 lb (85.276 kg).  General: Pleasant, NAD Psych: Normal affect. Neuro: Alert and oriented X 3. Moves all extremities spontaneously. HEENT: Normal  Neck: Supple without bruits or JVD. Lungs:  Resp regular and unlabored, CTA. Heart: RRR no s3, s4, or murmurs. Abdomen: Soft, non-tender, non-distended, BS + x 4.  Extremities: No clubbing, cyanosis or edema. DP/PT/Radials 2+ and equal bilaterally.   Accessory Clinical Findings:  EKG - NSR, 86, RBBB, LAFB, Bifascicular block, LVH, no st/t changes, no changes from prior studies  Assessment & Plan:  1. Paroxysmal SVT: -Currently in NSR -She is hesitant to keep her appointment with Dr. Rayann Heman on 06/21/14 for evaluation of RFCA - I have discussed with her in detail the importance of this appointment  -She would prefer to go when her son is able to be there with her, advised her we could reschedule this so her son could be there with her - she will go if her son can go with her (he will be OOT 9/30) -Her medication bottle today indicates Toprol XL 25 mg daily rather than the increased dosage of 50 mg as indicated per [facility] notes - she reports not tolerating the 50 mg dose -Not currently on diltiazem - reports not tolerating this either  2. NICM: -Weight up 6 pounds from admission weight (182-->188), no LEE, notes abdominal fullness -EF 30-35% by echo April 2015 -Check echo -Add Lasix 20 mg x 3 days - check BMET -Not on ACEi 2/2 cough -Add losartan 50 mg daily -Continue Toprol XL 25 mg  3. HTN: -Add losartan 50 mg -Continue Toprol XL 25 mg daily  4. HLD: -Continue Lipitor 20 mg  5. Tobacco abuse: -Cessation advised  6. Dispo: -Echo 9/29 -Appointment with Dr. Rayann Heman rescheduled to 10/7  Christell Faith, PA-C [address], [place] [address] [phone] [facility] 06/14/2014, 3:29 PM

## 2014-06-14 NOTE — Patient Instructions (Signed)
Your physician has recommended you make the following change in your medication:  Start Losartan 50 mg once daily   Your physician has requested that you have an echocardiogram. Echocardiography is a painless test that uses sound waves to create images of your heart. It provides your doctor with information about the size and shape of your heart and how well your heart's chambers and valves are working. This procedure takes approximately one hour. There are no restrictions for this procedure.  Your physician recommends that you have labs today: BMP  Your physician recommends that you schedule a follow-up appointment in:  2 months with Dr. Rockey Situ     Do not cancel your appointment with Dr. Rayann Heman. Please reschedule the appointment for a time that works for you.

## 2014-06-15 LAB — BASIC METABOLIC PANEL
BUN/Creatinine Ratio: 17 (ref 11–26)
BUN: 17 mg/dL (ref 8–27)
CHLORIDE: 103 mmol/L (ref 97–108)
CO2: 21 mmol/L (ref 18–29)
Calcium: 8.7 mg/dL (ref 8.7–10.3)
Creatinine, Ser: 1.03 mg/dL — ABNORMAL HIGH (ref 0.57–1.00)
GFR calc Af Amer: 61 mL/min/{1.73_m2} (ref >59)
GFR calc non Af Amer: 53 mL/min/{1.73_m2} — ABNORMAL LOW (ref >59)
GLUCOSE: 162 mg/dL — AB (ref 65–99)
Potassium: 4.1 mmol/L (ref 3.5–5.2)
SODIUM: 139 mmol/L (ref 134–144)

## 2014-06-20 ENCOUNTER — Other Ambulatory Visit: Payer: Self-pay

## 2014-06-20 ENCOUNTER — Other Ambulatory Visit (INDEPENDENT_AMBULATORY_CARE_PROVIDER_SITE_OTHER): Payer: Medicare Other

## 2014-06-20 DIAGNOSIS — I5042 Chronic combined systolic (congestive) and diastolic (congestive) heart failure: Secondary | ICD-10-CM

## 2014-06-20 DIAGNOSIS — I428 Other cardiomyopathies: Secondary | ICD-10-CM

## 2014-06-20 DIAGNOSIS — I509 Heart failure, unspecified: Secondary | ICD-10-CM

## 2014-06-20 DIAGNOSIS — I498 Other specified cardiac arrhythmias: Secondary | ICD-10-CM

## 2014-06-20 DIAGNOSIS — R002 Palpitations: Secondary | ICD-10-CM

## 2014-06-21 ENCOUNTER — Institutional Professional Consult (permissible substitution): Admitting: Internal Medicine

## 2014-06-28 ENCOUNTER — Institutional Professional Consult (permissible substitution): Admitting: Internal Medicine

## 2014-07-22 IMAGING — CR DG CHEST 2V
1 series · 2 of 2 positions shown · non-contrast
Comparison: none

REASON FOR EXAM: dyspnea, cough eval pneumonia
COMMENTS:

PROCEDURE:     DXR - DXR CHEST PA (OR AP) AND LATERAL  - January 18, 2013 [DATE]
RESULT:     Comparison: 02/24/2012

[Series 1: w chest pa · 0.14mm/px · 2 of 2 slices shown]
[im 1/2]
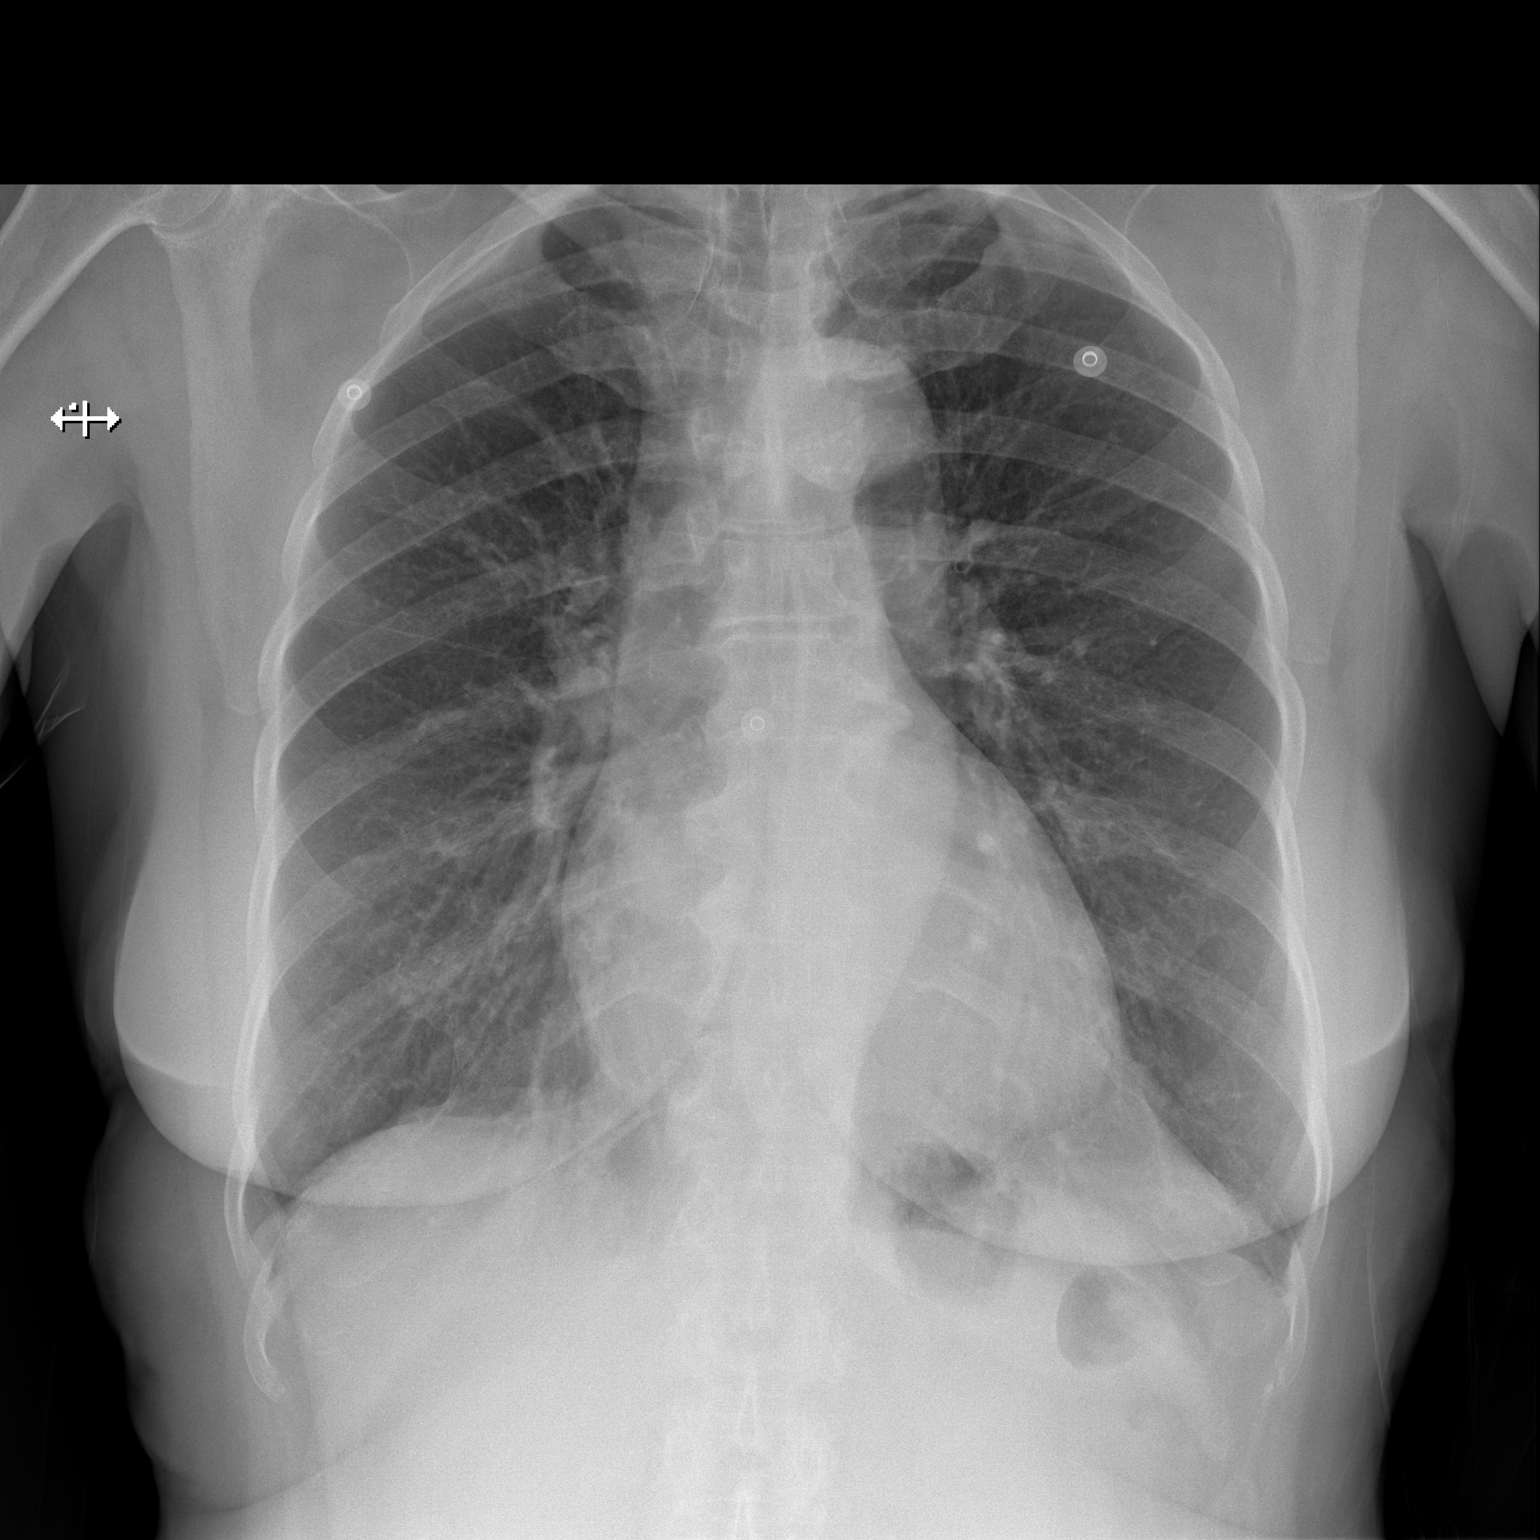
[im 2/2]
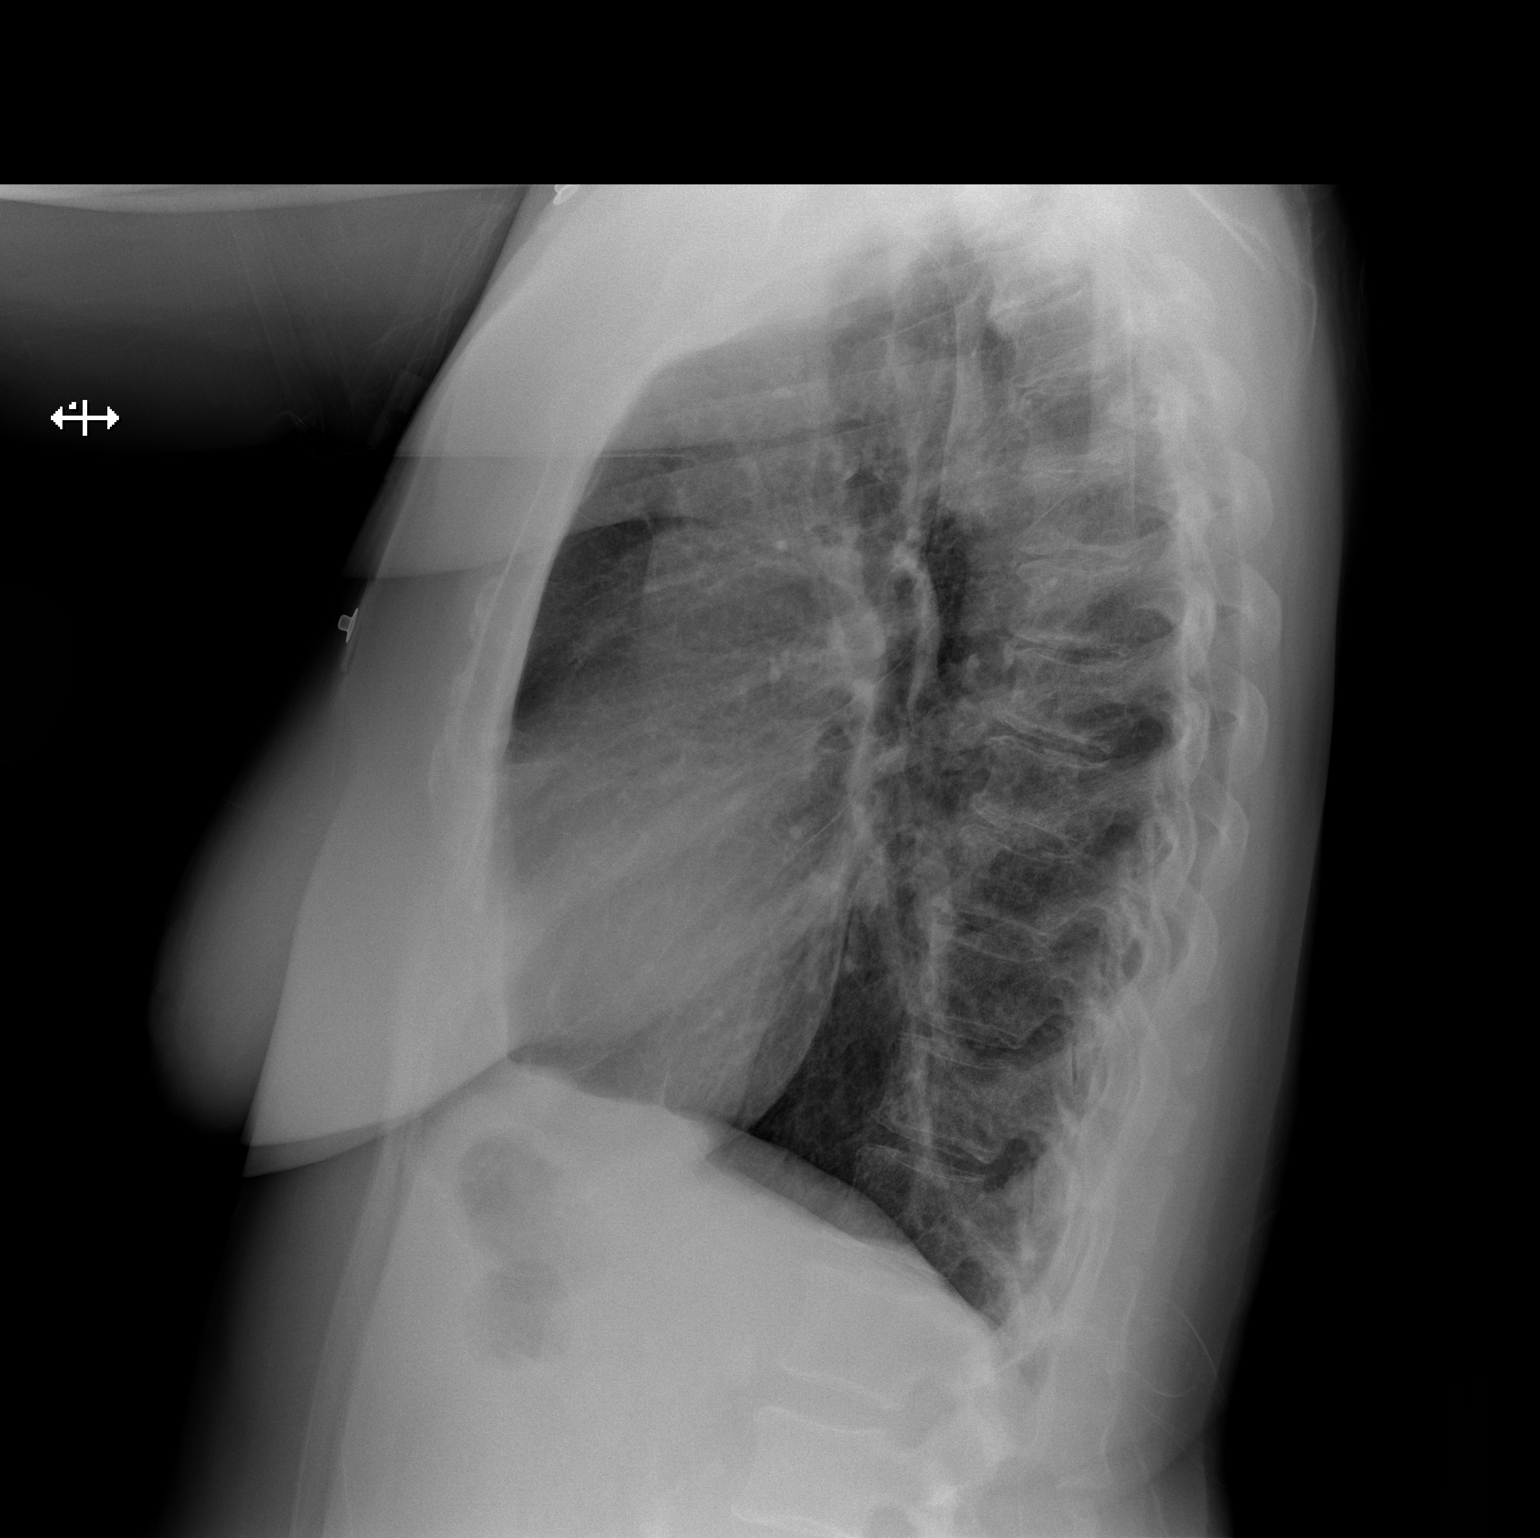

[2 of 2 positions shown; findings below may reference images not displayed]

FINDINGS: The heart and mediastinum are stable. No focal pulmonary opacities.
IMPRESSION: No acute cardiopulmonary disease.

## 2014-08-14 ENCOUNTER — Encounter: Payer: Self-pay | Admitting: Cardiovascular Disease

## 2014-08-14 ENCOUNTER — Ambulatory Visit (INDEPENDENT_AMBULATORY_CARE_PROVIDER_SITE_OTHER): Payer: Medicare Other | Admitting: Cardiovascular Disease

## 2014-08-14 VITALS — BP 110/70 | HR 99 | Ht 71.0 in | Wt 187.0 lb

## 2014-08-14 DIAGNOSIS — I493 Ventricular premature depolarization: Secondary | ICD-10-CM

## 2014-08-14 DIAGNOSIS — R0789 Other chest pain: Secondary | ICD-10-CM

## 2014-08-14 DIAGNOSIS — I5042 Chronic combined systolic (congestive) and diastolic (congestive) heart failure: Secondary | ICD-10-CM

## 2014-08-14 DIAGNOSIS — I10 Essential (primary) hypertension: Secondary | ICD-10-CM

## 2014-08-14 DIAGNOSIS — E119 Type 2 diabetes mellitus without complications: Secondary | ICD-10-CM

## 2014-08-14 DIAGNOSIS — I471 Supraventricular tachycardia: Secondary | ICD-10-CM

## 2014-08-14 DIAGNOSIS — Z720 Tobacco use: Secondary | ICD-10-CM

## 2014-08-14 DIAGNOSIS — F17200 Nicotine dependence, unspecified, uncomplicated: Secondary | ICD-10-CM

## 2014-08-14 NOTE — Assessment & Plan Note (Signed)
We have encouraged continued exercise, careful diet management in an effort to lose weight. 

## 2014-08-14 NOTE — Assessment & Plan Note (Signed)
We have encouraged her to continue to work on weaning her cigarettes and smoking cessation. She will continue to work on this and does not want any assistance with chantix.  

## 2014-08-14 NOTE — Assessment & Plan Note (Signed)
She appears relatively euvolemic on today's visit. Repeat echocardiogram showing normal ejection fraction. This was discussed with her. No changes to her medication

## 2014-08-14 NOTE — Assessment & Plan Note (Signed)
No recent episodes of tachycardia. Suggested she continue on her metoprolol. She reports that she feels better on metoprolol 50 mg daily, may even do better with additional dose in the evening. Suggested she add extra 25 mg grams in the evening, 50 mg if tolerated with her 50 mg in the morning

## 2014-08-14 NOTE — Progress Notes (Signed)
Patient ID: [name] [name], female    DOB: 03/19/1936, 78 y.o.   MRN: [id number]  HPI Comments: [name] [name] is a 78 y.o. female with long history of smoking who continues to smoke, combined CHF (EF 29-79%, + diastolic dysfunction, multiple WMAs), HTN, HLD, COPD,  tobacco abuse, rheumatoid arthritis, borderline DM2 and GERD. She presents for routine followup of her cardiomyopathy, hypertension.  In follow-up today, she reports that she feels well with no complaints. She stopped losartan as this was giving her a headache. She reports blood pressure has been well-controlled. She denies any arrhythmia. She's not taking diltiazem. Only taking metoprolol. No recent episodes of SVT since May 2015. She feels this was secondary to stress. She denies any significant lower extremity edema. No lightheadedness or dizziness She continues to smoke daily  EKG today shows normal sinus rhythm with rate 99 bpm, PVCs noted, poor R-wave progression through the anterior precordial leads, left anterior fascicular block  Other past medical history   lung nodule seen on CT scan of the chest, 1.7 cm ACE inhibitor cough  hospital admission April 21 with discharge 01/12/2014. Diagnosed with SVT, non-ST elevation MI, cardiomyopathy with ejection fraction 30%,   Heart rate initially measured at 170 beats per minute. Given adenosine. Troponin up to 0.23. Metoprolol was increased up to 50 mg daily.  Also started on Cymbalta for depression. Depression exacerbated by loss of several family members.  Imaging in the hospital also showed lung nodules recommendation suggested with primary care. 1.7 cm nodule anterior medial base of the left lower lobe  Recent recurrence of her SVT may 32,015 with heart rate up to 200 beats per minute requiring adenosine. Evaluation in the emergency room that evening and discharged home after negative cardiac enzymes She felt that she was having an anxiety attack. Notes indicate she  converted to normal sinus rhythm after adenosine was given confirming SVT.  cardiac catheterization in 2008 which revealed normal cors. She was evaluated for tachycardia/palpitations at that time which were managed by AVN blockers.  Previously admitted to [facility] April 2014 for cough and shortness of breath attributed to bronchitis. Cardiology was consulted due to elevated cardiac enzymes (trend 0.31->0.61->0.23).   2D echo 01/18/2013: LVEF 30-35%, mod global LV systolic dysfunction, diastolic dysfunction, mid-distal anterior wall, apical and mid-apical inferior/posterior WMAs, mild MR/TR, normal RVSP.   Hgb A1C 7.3%,  LDL 96,  TC 147   She notes chronic DOE/SOB which she attributes to smoking and COPD. occasional palpitations.   Daughter  passed away from lung cancer. Other family members with medical issues as well Staying with her son at nighttime   Outpatient Encounter Prescriptions as of 08/14/2014  Medication Sig  . aspirin 81 MG tablet Take 81 mg by mouth daily.  Marland Kitchen atorvastatin (LIPITOR) 20 MG tablet Take 20 mg by mouth daily.  . baclofen (LIORESAL) 10 MG tablet Take 5 mg by mouth 2 (two) times daily.  . cyclobenzaprine (FLEXERIL) 10 MG tablet Take 10 mg by mouth every 8 (eight) hours as needed for muscle spasms.  . diclofenac (VOLTAREN) 50 MG EC tablet Take 50 mg by mouth daily.  Marland Kitchen diltiazem (CARDIZEM SR) 120 MG 12 hr capsule Take 120 mg by mouth daily.  . furosemide (LASIX) 20 MG tablet Take 1 tab daily for the next 3 days. Take if weight increases more than 4 pounds.  . lansoprazole (PREVACID) 30 MG capsule Take 30 mg by mouth daily at 12 noon.  Marland Kitchen LORazepam (ATIVAN) 1 MG tablet Take  1 mg by mouth 2 (two) times daily as needed for anxiety.  Marland Kitchen losartan (COZAAR) 50 MG tablet Take 1 tablet (50 mg total) by mouth daily.  . metoprolol succinate (TOPROL-XL) 50 MG 24 hr tablet Take 25 mg by mouth daily. Take with or immediately following a meal.  . mirtazapine (REMERON) 15 MG tablet Take  15 mg by mouth at bedtime.  . nitroGLYCERIN (NITROSTAT) 0.4 MG SL tablet Place 0.4 mg under the tongue every 5 (five) minutes as needed for chest pain.  Marland Kitchen ondansetron (ZOFRAN-ODT) 4 MG disintegrating tablet Take 4 mg by mouth every 8 (eight) hours as needed for nausea or vomiting.  Marland Kitchen PARoxetine (PAXIL) 10 MG tablet Take 10 mg by mouth daily.  . phenytoin (DILANTIN) 100 MG ER capsule Take 150 mg by mouth 2 (two) times daily.  Marland Kitchen rOPINIRole (REQUIP) 0.25 MG tablet Take 0.25 mg by mouth at bedtime.   Social history  reports that she has been smoking Cigarettes.  She has a 12.5 pack-year smoking history. She does not have any smokeless tobacco history on file. She reports that she does not drink alcohol or use illicit drugs.  Review of Systems  Constitutional: Negative.   Respiratory: Negative.   Cardiovascular: Positive for palpitations.       Tachycardia  Gastrointestinal: Negative.   Musculoskeletal: Negative.   Skin: Negative.   Neurological: Negative.   Hematological: Negative.   Psychiatric/Behavioral: The patient is nervous/anxious.   All other systems reviewed and are negative.   BP 110/70 mmHg  Pulse 99  Ht 5\' 11"  (1.803 m)  Wt 187 lb (84.823 kg)  BMI 26.09 kg/m2  Physical Exam  Constitutional: She is oriented to person, place, and time. She appears well-developed and well-nourished.  Obese  HENT:  Head: Normocephalic.  Nose: Nose normal.  Mouth/Throat: Oropharynx is clear and moist.  Eyes: Conjunctivae are normal. Pupils are equal, round, and reactive to light.  Neck: Normal range of motion. Neck supple. No JVD present.  Cardiovascular: Normal rate, regular rhythm, S1 normal, S2 normal and intact distal pulses.  Exam reveals no gallop and no friction rub.   Murmur heard.  Systolic murmur is present with a grade of 2/6  Pulmonary/Chest: Effort normal and breath sounds normal. No respiratory distress. She has no wheezes. She has no rales. She exhibits no tenderness.   Abdominal: Soft. Bowel sounds are normal. She exhibits no distension. There is no tenderness.  Musculoskeletal: Normal range of motion. She exhibits no edema or tenderness.  Lymphadenopathy:    She has no cervical adenopathy.  Neurological: She is alert and oriented to person, place, and time. Coordination normal.  Skin: Skin is warm and dry. No rash noted. No erythema.  Psychiatric: She has a normal mood and affect. Her behavior is normal. Judgment and thought content normal.    Assessment and Plan  Nursing note and vitals reviewed.

## 2014-08-14 NOTE — Assessment & Plan Note (Signed)
Blood pressure well controlled. She would like additional metoprolol in the evening for palpitations. We have suggested she add an additional 25 mg metoprolol succinate slowly

## 2014-08-14 NOTE — Patient Instructions (Signed)
You are doing well. Ok to take extra metoprolol 1/2 up to a whole pill at night in addition to one pill in the morning  Please call us if you have new issues that need to be addressed before your next appt.  Your physician wants you to follow-up in: 6 months.  You will receive a reminder letter in the mail two months in advance. If you don't receive a letter, please call our office to schedule the follow-up appointment.

## 2014-12-22 ENCOUNTER — Emergency Department
Admit: 2014-12-22 | Disposition: A | Discharge status: Home / Self Care | Payer: Self-pay | Admitting: Emergency Medicine

## 2014-12-28 ENCOUNTER — Emergency Department
Admit: 2014-12-28 | Disposition: A | Discharge status: Home / Self Care | Payer: Self-pay | Admitting: Emergency Medicine

## 2014-12-28 LAB — CBC
HCT: 37.4 % (ref 35.0–47.0)
HGB: 11.8 g/dL — ABNORMAL LOW (ref 12.0–16.0)
MCH: 27.3 pg (ref 26.0–34.0)
MCHC: 31.5 g/dL — ABNORMAL LOW (ref 32.0–36.0)
MCV: 87 fL (ref 80–100)
Platelet: 232 10*3/uL (ref 150–440)
RBC: 4.33 10*6/uL (ref 3.80–5.20)
RDW: 15.8 % — ABNORMAL HIGH (ref 11.5–14.5)
WBC: 11.9 10*3/uL — ABNORMAL HIGH (ref 3.6–11.0)

## 2014-12-28 LAB — COMPREHENSIVE METABOLIC PANEL
ALK PHOS: 61 U/L
ALT: 9 U/L — AB
Albumin: 3 g/dL — ABNORMAL LOW
Anion Gap: 11 (ref 7–16)
BILIRUBIN TOTAL: 0.3 mg/dL
BUN: 16 mg/dL
CALCIUM: 8.5 mg/dL — AB
CREATININE: 1 mg/dL
Chloride: 106 mmol/L
Co2: 24 mmol/L
EGFR (Non-African Amer.): 55 — ABNORMAL LOW
GLUCOSE: 135 mg/dL — AB
Potassium: 3.5 mmol/L
SGOT(AST): 16 U/L
Sodium: 141 mmol/L
Total Protein: 7.2 g/dL

## 2014-12-28 LAB — URINALYSIS, COMPLETE
BACTERIA: NONE SEEN
BILIRUBIN, UR: NEGATIVE
BLOOD: NEGATIVE
GLUCOSE, UR: NEGATIVE mg/dL (ref 0–75)
Ketone: NEGATIVE
NITRITE: NEGATIVE
Ph: 6 (ref 4.5–8.0)
Protein: 30
Specific Gravity: 1.013 (ref 1.003–1.030)

## 2014-12-28 LAB — CK: CK, TOTAL: 38 U/L

## 2014-12-28 LAB — TROPONIN I: Troponin-I: <0.03 ng/mL

## 2014-12-31 LAB — URINE CULTURE

## 2015-01-12 NOTE — Discharge Summary (Signed)
PATIENT NAME:  [name] [name], [name] [name] MR#:  [id number] DATE OF BIRTH:  17-Mar-1936  DATE OF ADMISSION:  01/18/2013  DATE OF DISCHARGE:  01/19/2013  ADMITTING PHYSICIAN:  Dr. Phillips Climes.  DISCHARGING PHYSICIAN: Gladstone Lighter.  CONSULTATIONS IN THE HOSPITAL:  1.  Cardiology consultation by Dr. Ida Rogue.   PRIMARY CARE PHYSICIAN:  Dr. Margret Chance.   DISCHARGE DIAGNOSES: 1.  Chronic obstructive pulmonary disease exacerbation with bronchitis.  2.  Elevated troponins, likely demand ischemia.  3.  Chronic systolic congestive heart failure with ejection fraction of 30%.  4.  Coronary artery disease with wall motion abnormalities noted on echo.  5.  Hypertension.  6.  Anxiety.  7.  Tobacco use disorder.   DISCHARGE HOME MEDICATIONS: 1.  Enalapril 2.5 mg p.o. daily.  2.  Atorvastatin 20 mg p.o. daily.  3.  Aspirin 81 mg p.o. daily.  4.  Alprazolam 0.25 mg p.o. b.i.d. p.r.n. for anxiety.  5.  Nitroglycerin 0.4 mg sublingual tablet every 5 minutes as needed for chest pain.  6.  Ventolin inhaler 2 puffs 4 times a day as needed for difficulty breathing.  7.  Symbicort 160/4.5 mcg 2 every puffs twice a day.  8.  Omeprazole 40 mg p.o. daily.  9.  Levaquin 250 mg p.o. daily for 5 days.  10.  Metoprolol 25 mg p.o. b.i.d.  11.  Prednisone taper.  12.  Tussionex 5 mL q.8 hours p.r.n. for cough.  HOME HEALTH:  None.  DISCHARGE OXYGEN:  None.   DISCHARGE ACTIVITY:  As tolerated.   DISCHARGE DIET:  Low-sodium diet.     FOLLOWUP INSTRUCTIONS: 1.  Follow up with PCP in 2 weeks.  2.  Follow up with Dr. Rockey Situ in 1 to 2 weeks.   LABORATORY AND IMAGING STUDIES: LDL cholesterol 96, HDL 35, triglycerides 78, total cholesterol 147.   WBC of 10.1, hemoglobin 11.9, hematocrit 35.8, platelet count 238.   Sodium 138, potassium 4.8, chloride 107, bicarb 26, BUN 18, creatinine 1.04, glucose 157 and  calcium 9.0. Troponins elevated at 0.31, 0.6 and then 0.23. HbA1c is 7.3%.   Echo  Doppler: Left ventricular ejection fraction, EF 30% to 35%, moderately decreased global LV systolic function, mildly increased internal cavity size of left ventricle, significant wall motion abnormality of the mid to distal anterior wall apical region and mid to apical inferior and posterior walls.  Chest x-ray on admission revealing clear lung fields. No acute cardiopulmonary disease.   BRIEF HOSPITAL COURSE:  The patient is a 79 year old African American female with past medical history significant for coronary artery disease, hypertension, ongoing smoking with chronic obstructive pulmonary disease, admitted to the hospital secondary to dyspnea worsening lately. The patient was found to elevated troponin and admitted to telemetry for possible myocardial infarction.  1.  Elevated troponin secondary to demand ischemia, as the patient was significantly in respiratory distress secondary to chronic obstructive pulmonary disease on admission. She did require 2 to 3 liters of oxygen on admission. Repeat troponins were steady and were kind of trending down, seen by Dr. Rockey Situ from cardiology standpoint. Echo showing wall motion abnormality in apical anterior and also inferior wall; however, Dr. Rockey Situ feels that this is chronic. He recommended outpatient catheterization versus stress test and will follow up with the patient in 1 to 2 weeks. Medical management as recommended until then. The patient  has been ambulating well without any evidence of chest pain or dyspnea.  2.  Chronic obstructive pulmonary disease exacerbation with bronchitis. Her initial symptoms  were more relevant  with chronic obstructive pulmonary disease and bronchitis. Her symptoms improved and breathing improved after starting on Solu-Medrol, neb treatments, and also Symbicort and albuterol inhaler have been added at discharge. The patient was on IV antibiotics and was changed over to Levaquin at the time of discharge. She is breathing well on  room air, and has ambulated with sats greater than 90% on room air, even on exertion. She is being discharged on Levaquin, prednisone taper and her inhalers.  3.  Chronic systolic congestive heart failure, ejection fraction of 30%, probably also has underlying diastolic dysfunction. She is not fluid overloaded, so holding off on Lasix as her blood pressure is on the lower side. Continue on aspirin and statin. Metoprolol and also enalapril are started in the hospital.  4.  Tobacco use disorder. Counseled on admission and at discharge and not ready to quit at this time. All the discharge instructions have been explained to both the patient and also her son at bedside, and they have agreed to the plan. Her course has been otherwise uneventful in the hospital.   DISCHARGE CONDITION: Stable.   DISCHARGE DISPOSITION: Home.   TIME SPENT ON DISCHARGE: 45 minutes.    ____________________________ Gladstone Lighter, MD rk:dmm D: 01/19/2013 17:17:30 ET T: 01/19/2013 19:36:38 ET JOB#: [id number]  cc: Gladstone Lighter, MD, <Dictator> Minna Merritts, MD Kathrynn Humble. Dollene Primrose, MD Gladstone Lighter MD ELECTRONICALLY SIGNED 01/20/2013 15:42

## 2015-01-12 NOTE — Consult Note (Signed)
General Aspect 79 year old female with PMH of hypertension, anxiety, rheumatoid arthritis, hyperglycemia, presents with complaint of shortness of breath and cough for a few days. Cardiology was consulted for elevated cardiac enz.   The patient reports her symptoms beginning for the last 3 days,  cough,  congested similar to previous epsiodes of bronchitis. Night of admission, she had severe cough to the point where she could not breath. She called EMS.    Denies any sick contacts. Denies any chest pain. Upon presentation to ED,  Chest x-ray did not show any acute infiltrate. the patient reported feeling some lightheadedness with heart racing and dizziness.  patient's troponin was significantly elevated at 0.31.  She was given 324 of aspirin in the ED and started on heparin drip.   she was seen in 2008 by [facility], Dr. Haroldine Laws, where she had cardiac cath which was essentially within normal limits, no significant CAD.  she says she does not have any problems with her heart apart from palpitations. The patient did have some mild leukocytosis at 13,000.   Present Illness . ALLERGIES: BENADRYL AND SULFA DRUGS.   SOCIAL HISTORY: The patient still smokes 1 pack per day. Denies any alcohol or illicit drug use.   FAMILY HISTORY: Denies any history of coronary artery disease in her family at a young age.   Physical Exam:  GEN well developed, well nourished, no acute distress   HEENT red conjunctivae   NECK supple  No masses   RESP normal resp effort  rhonchi   CARD Regular rate and rhythm  No murmur   ABD denies tenderness  soft   LYMPH negative neck   EXTR negative edema   SKIN normal to palpation   NEURO motor/sensory function intact   PSYCH alert, A+O to time, place, person, good insight   Review of Systems:  Subjective/Chief Complaint chest congestion, cough   General: Fatigue   Skin: No Complaints   ENT: No Complaints   Eyes: No Complaints   Neck: No  Complaints   Respiratory: Frequent cough  Short of breath   Cardiovascular: Dyspnea   Gastrointestinal: No Complaints   Genitourinary: No Complaints   Vascular: No Complaints   Musculoskeletal: No Complaints   Neurologic: No Complaints   Hematologic: No Complaints   Endocrine: No Complaints   Psychiatric: No Complaints   Review of Systems: All other systems were reviewed and found to be negative   Medications/Allergies Reviewed Medications/Allergies reviewed     irregular heartrate:    small bowel obstruction:    Depression:    Arthritis:    heartburn:    hypertension:    anxiety:    "bowel twisted":        Admit Reason:   Non-ST elevation myocardial infarction (NSTEMI) (410.70): Status: Active, Coding System: ICD9, Coded Name: Acute myocardial infarction, subendocardial infarction, episode of care unspecified  Home Medications: Medication Instructions Status  alprazolam 0.25 mg oral tablet 1 tab(s) orally 2 times a day, As Needed- for Anxiety, Nervousness  Active  omeprazole 40 mg oral delayed release capsule 1 cap(s) orally once a day Active  hydrochlorothiazide 25 mg oral tablet 1 tab(s) orally once a day Active  Ventolin HFA CFC free 90 mcg/inh inhalation aerosol 2 puff(s) inhaled 4 times a day, As Needed - for Shortness of Breath Active   Lab Results:  Hepatic:  29-Apr-14 01:07   Bilirubin, Total 0.4  Alkaline Phosphatase 83  SGPT (ALT) 18  SGOT (AST) 29  Total Protein, Serum  8.7  Albumin, Serum 3.6  Routine Chem:  29-Apr-14 01:07   BUN  20  Creatinine (comp) 1.19  Sodium, Serum 139  Potassium, Serum 3.9  Chloride, Serum 106  CO2, Serum 28  Calcium (Total), Serum 9.0  Osmolality (calc) 281  eGFR (African American)  52  eGFR (Non-African American)  45 (eGFR values <38m/min/1.73 m2 may be an indication of chronic kidney disease (CKD). Calculated eGFR is useful in patients with stable renal function. The eGFR calculation will not be  reliable in acutely ill patients when serum creatinine is changing rapidly. It is not useful in  patients on dialysis. The eGFR calculation may not be applicable to patients at the low and high extremes of body sizes, pregnant women, and vegetarians.)  Anion Gap  5  B-Type Natriuretic Peptide ([facility])  228 (Result(s) reported on 18 Jan 2013 at 02:04AM.)  Result Comment TROPONIN - RESULTS VERIFIED BY REPEAT TESTING.  - CALLED TO [email or web address]:01/18/13@0212   - READ-BACK PROCESS PERFORMED.  - TPL  Result(s) reported on 18 Jan 2013 at 02:15AM.  Cardiac:  29-Apr-14 01:07   Troponin I  0.31 (0.00-0.05 0.05 ng/mL or less: NEGATIVE  Repeat testing in 3-6 hrs  if clinically indicated. >0.05 ng/mL: POTENTIAL  MYOCARDIAL INJURY. Repeat  testing in 3-6 hrs if  clinically indicated. NOTE: An increase or decrease  of 30% or more on serial  testing suggests a  clinically important change)  CK, Total  297  CPK-MB, Serum  3.7 (Result(s) reported on 18 Jan 2013 at 02:10AM.)  Routine Hem:  29-Apr-14 01:07   WBC (CBC)  13.0  RBC (CBC) 4.05  Hemoglobin (CBC) 12.5  Hematocrit (CBC) 37.0  Platelet Count (CBC) 246 (Result(s) reported on 18 Jan 2013 at 01:39AM.)  MCV 91  MCH 30.9  MCHC 33.9  RDW  15.6   EKG:  Interpretation EKG shows NSR with frquent APCs, PVCs   Radiology Results: XRay:    29-Apr-14 00:52, Chest PA and Lateral  Chest PA and Lateral   REASON FOR EXAM:    dyspnea, cough eval pneumonia  COMMENTS:       PROCEDURE: DXR - DXR CHEST PA (OR AP) AND LATERAL  - Jan 18 2013 12:52AM     RESULT: Comparison: 02/24/2012    Findings:  The heart and mediastinum are stable. No focal pulmonary opacities.    IMPRESSION:   No acute cardiopulmonary disease.        Verified By: RGregor Hams M.D., MD    Sulfa drugs: Hives  Benadryl: Hives  Vital Signs/Nurse's Notes: **Vital Signs.:   29-Apr-14 07:33  Vital Signs Type Admission  Temperature Temperature (F) 98.3  Celsius 36.8   Temperature Source oral  Pulse Pulse 76  Respirations Respirations 23  Systolic BP Systolic BP 97  Diastolic BP (mmHg) Diastolic BP (mmHg) 62  Mean BP 73  Pulse Ox % Pulse Ox % 99  Oxygen Delivery 2L  Pulse Ox Heart Rate 7109   Impression 79year old female with PMH of hypertension, anxiety, rheumatoid arthritis, hyperglycemia, presents with complaint of shortness of breath and cough for a few days. Cardiology was consulted for elevated cardiac enz.  1) Elevated cardiac enz: clinical presentation concerning for bronchitis, cough congestiopn, elevated WBC Elevated cardiac enz likely from demand ischemia, severe coughing episode, possible hypoxia ("could not breath"), tachycardia. --Would continue to monitor cardiac enz. If they trend about the same without significant climb, would d/c heparin. -Can hold IVF as she is eating well. renal function normal -  echo pending -No immediate need for stress testing given bronchitis is likely cause of her presentation.  2) Bronchitis On abx, still with cough this AM, congestion.  3)HTN on HCTZ (could hold this for low BP)   Electronic Signatures: Ida Rogue (MD)  (Signed 29-Apr-14 09:35)  Authored: General Aspect/Present Illness, History and Physical Exam, Review of System, Past Medical History, Health Issues, Home Medications, Labs, EKG , Radiology, Allergies, Vital Signs/Nurse's Notes, Impression/Plan   Last Updated: 29-Apr-14 09:35 by Ida Rogue (MD)

## 2015-01-12 NOTE — H&P (Signed)
PATIENT NAME:  [name] [name], [name] [name] MR#:  [id number] DATE OF BIRTH:  [birth year]  DATE OF ADMISSION:  01/18/2013  PRIMARY CARE PHYSICIAN: Dr. Margret Chance.   REFERRING PHYSICIAN: Dr. Charlesetta Ivory.   CHIEF COMPLAINT: Shortness of breath.   HISTORY OF PRESENT ILLNESS: This is a 79 year old female with significant past medical history of hypertension, anxiety, rheumatoid arthritis, hyperglycemia. Presents with complaint of shortness of breath. The patient reports her symptoms beginning for the last 2 days. As well, reports some cough, feeling congested but not able to cough any phlegm. Denies any sick contacts. Denies any chest pain. Upon presentation to ED, the patient had basic workup. Chest x-ray did not show any acute infiltrate, but the patient reports feeling some lightheadedness with heart racing and dizziness. She had an EKG done which did show right bundle branch block which was compared and appears to be old to a previous EKG, but the patient's troponin was significantly elevated at 0.31, where she was diagnosed with a non-ST elevation MI. She was given 324 of aspirin in the ED and started on heparin drip. As mentioned earlier, the patient still denies any chest pain. The patient denies any cardiac issues. By reviewing her history, she was seen in 2008 by [facility], Dr. Haroldine Laws, where she had cardiac cath which was essentially within normal limits then. The patient reports she kept following with the same cardiology group even though she cannot recall the physician or the group name, where she reports she has been following up with them without being prescribed any new medications, where she says she does not have any problems with her heart. The patient did have some mild leukocytosis at 13,000. Denies any dysuria, fever, chills. Hospitalist service was requested to admit the patient for further management and treatment for her non-STEMI.   PAST MEDICAL HISTORY:  1. Tobacco abuse.   2. Hypertension.  3. Anxiety.  4. Rheumatoid arthritis.  5. Osteoarthritis.  6. Hyperglycemia.  7. Hyperuricemia.   PAST SURGICAL HISTORY:  1. Bowel obstruction.  2. Tubal ligation.  3. Cardiac cath in 2008.   HOME MEDICATIONS:  1. Alprazolam 0.25 two times a day as needed for anxiety.  2. Ventolin as needed.  3. Hydrochlorothiazide 25 mg oral daily.  4. Omeprazole 40 mg oral daily.   ALLERGIES: BENADRYL AND SULFA DRUGS.   SOCIAL HISTORY: The patient still smokes 1 pack per day. Denies any alcohol or illicit drug use.   FAMILY HISTORY: Denies any history of coronary artery disease in her family at a young age.   REVIEW OF SYSTEMS:  CONSTITUTIONAL: Denies fever, chills, weakness, fatigue.  EYES: Denies blurry vision, double vision, inflammation, glaucoma.  ENT: Denies tinnitus, ear pain, hearing loss or epistaxis.  RESPIRATORY: Complains of cough. Denies hemoptysis, wheezing, pneumonia, chest pain during respiration. Has complaints of some dyspnea.  CARDIOVASCULAR: Denies chest pain, edema, arrhythmia, syncope. Has some complaints of palpitation.  GASTROINTESTINAL: Denies nausea, vomiting, diarrhea, abdominal pain, coffee-ground emesis, bright red blood per rectum.  GENITOURINARY: Denies dysuria, hematuria or renal colic.  ENDOCRINE: Denies polyuria, polydipsia, heat or cold intolerance.  HEMATOLOGY: Denies anemia, easy bruising, bleeding diathesis.  INTEGUMENTARY: Denies acne, rash or skin lesions.  MUSCULOSKELETAL: Denies neck pain, shoulder pain, cramps. Has history of gout and arthritis.  NEUROLOGIC: Denies numbness, dysarthria, epilepsy, tremors, vertigo, CVA, TIA or seizures.  PSYCHIATRIC: Denies anxiety, insomnia, bipolar disorder or schizophrenia.   PHYSICAL EXAMINATION:  VITAL SIGNS: Temperature 98.2, pulse 102, respiratory rate 20, blood pressure 131/72, saturating 97%  on oxygen.  GENERAL: Well-nourished female, looks comfortable in bed in no apparent distress.   HEENT: Head atraumatic, normocephalic. Pupils equal, reactive to light. Pink conjunctivae. Anicteric sclerae. Moist oral mucosa.  NECK: Supple. No thyromegaly. No JVD.  CHEST: Good air entry bilaterally. No wheezing, rales, rhonchi.  CARDIOVASCULAR: S1, S2 heard. No rubs, murmurs, gallops.  ABDOMEN: Soft, nontender, nondistended. Bowel sounds present.  EXTREMITIES: No edema. No clubbing. No cyanosis.  PSYCHIATRIC: Appropriate affect. Awake, alert x3. Intact judgment and insight.  NEUROLOGIC: Cranial nerves grossly intact. Motor 5 out of 5. No focal deficits.  SKIN: Normal skin turgor. Warm and dry. No rash.   PERTINENT LABS: Glucose 122. BNP 228. BUN 20, creatinine 1.19, sodium 139, potassium 3.9, chloride 106, CO2 28. ALT 18, AST 29, alk phos 83. Troponin 0.31, CK-MB 3.7, total CK 297. White blood cells 13, hemoglobin 12.5, hematocrit 37, platelets 246.   EKG showing normal sinus rhythm with multiple PVCs, with right bundle branch block and left anterior fascicular block which is old.   ASSESSMENT AND PLAN:  1. Non-ST elevation myocardial infarction: The patient was given 324 of aspirin. Will be started on heparin drip. Will continue to cycle her cardiac enzymes and follow the trend. Will be admitted to telemetry unit. Will be started on statin and beta blockers and sublingual nitroglycerin. Will check echocardiogram in the morning and consult cardiology service.  2. Acute bronchitis: Will continue the patient on azithromycin.  3. Leukocytosis: Most likely due to her bronchitis. Chest x-ray does not have any infiltrate. Also, will check urinalysis.  4. Hypertension: Acceptable. Continue home medication hydrochlorothiazide. Added metoprolol.  5. Rheumatoid arthritis: The patient is following with rheumatology as an outpatient. Unclear if she is still on methotrexate or not at this point.  6. Tobacco abuse: The patient was counseled at length and agreeable to stop smoking and will be started on  NicoDerm patch.  7. Hyperglycemia: No history of diabetes, but as per history, the patient is known to have history of hyperglycemia. As well, her blood glucose is elevated, so will continue to monitor her fingersticks and will check hemoglobin A1c.  8. Deep vein thrombosis prophylaxis: The patient is on heparin drip for non-ST elevation myocardial infarction.   CODE STATUS: The patient is FULL CODE.   TOTAL TIME SPENT ON ADMISSION AND PATIENT CARE: 55 minutes.   ____________________________ Albertine Patricia, MD dse:gb D: 01/18/2013 03:43:59 ET T: 01/18/2013 04:49:03 ET JOB#: [id number]  cc: Albertine Patricia, MD, <Dictator> Imari Sivertsen Graciela Husbands MD ELECTRONICALLY SIGNED 01/18/2013 6:22

## 2015-01-12 NOTE — Op Note (Signed)
PATIENT NAME:  [name] [name], [name] [name] MR#:  [id number] DATE OF BIRTH:  23-Feb-1936  DATE OF PROCEDURE:  09/06/2013  PREOPERATIVE DIAGNOSIS: Visually significant cataract of the left eye.   POSTOPERATIVE DIAGNOSIS: Visually significant cataract of the left eye.   OPERATIVE PROCEDURE: Cataract extraction by phacoemulsification with implant of intraocular lens to left eye.   SURGEON: Birder Robson, MD.   ANESTHESIA:  1. Managed anesthesia care.  2. Topical tetracaine drops followed by 2% Xylocaine jelly applied in the preoperative holding area.   COMPLICATIONS: None.   TECHNIQUE:  Stop and chop  DESCRIPTION OF PROCEDURE: The patient was examined and consented in the preoperative holding area where the aforementioned topical anesthesia was applied to the left eye and then brought back to the Operating Room where the left eye was prepped and draped in the usual sterile ophthalmic fashion and a lid speculum was placed. A paracentesis was created with the side port blade and the anterior chamber was filled with viscoelastic. A near clear corneal incision was performed with the steel keratome. A continuous curvilinear capsulorrhexis was performed with a cystotome followed by the capsulorrhexis forceps. Hydrodissection and hydrodelineation were carried out with BSS on a blunt cannula. The lens was removed in a stop and chop technique and the remaining cortical material was removed with the irrigation-aspiration handpiece. The capsular bag was inflated with viscoelastic and the Tecnis ZCB00 20.0-diopter lens, serial number [id number] was placed in the capsular bag without complication. The remaining viscoelastic was removed from the eye with the irrigation-aspiration handpiece. The wounds were hydrated. The anterior chamber was flushed with Miostat and the eye was inflated to physiologic pressure. 0.1 mL of cefuroxime concentration 10 mg/mL was placed in the anterior chamber. The wounds were found to be water  tight. The eye was dressed with Vigamox. The patient was given protective glasses to wear throughout the day and a shield with which to sleep tonight. The patient was also given drops with which to begin a drop regimen today and will follow-up with me in one day.    ____________________________ Livingston Diones. Zineb Glade, MD wlp:dp D: 09/06/2013 15:23:24 ET T: 09/06/2013 15:50:56 ET JOB#: [id number]  cc: Aneta Hendershott L. Kendyl Bissonnette, MD, <Dictator> Livingston Diones Avyay Coger MD ELECTRONICALLY SIGNED 09/07/2013 9:50

## 2015-01-13 NOTE — Consult Note (Signed)
General Aspect PCP:  [name] [name]. [name] Primrose, MD  Primary Cardiologist:  Johnny Bridge, MD _____________  79 y/o female with a h/o NICM and SVT who was admitted yesterday 2/2 recurrent SVT. ____________  Past Medical History ??? Hypertension  ??? Hyperlipidemia  ??? COPD (chronic obstructive pulmonary disease)    a. With ongoing tobacco use. ??? GERD (gastroesophageal reflux disease)  ??? Anxiety  ??? Rheumatoid arthritis  ??? Osteoarthritis  ??? Hyperuricemia  ??? Diabetes mellitus without complication  ??? Chronic combined systolic and diastolic CHF, NYHA class 1    a. 12/2013 Echo: EF 30-35%, DD, mid-distal anterior wall, apical and mid-apical inferior/posterior WMAs, mild MR/TR, normal RVSP.  ??? Renal insufficiency  ??? Thyroid nodule  ??? Lung nodule  ??? Tobacco abuse  ??? S/P cardiac cath    a. 2008 Cath: nl cors. ??? SVT (supraventricular tachycardia)    a. 12/2013 & 01/2014-->broke with adenosine. ??? Pulmonary nodule, left    a.  H/O 1.7 cm nodule anterior medial base of the left lower lobe, not seen on 12/2013 CXR [facility]).  Past Surgical History ??? Bowel obstruction   ??? Tubal ligation   ??? Cardiac catheterization  2008   [facility] _____________  Family History ??? Coronary artery disease Neg Hx    Early ??? Stroke Neg Hx    CVA ??? Hypertension Other  ??? Diabetes Other  ??? Hypertension Mother  _____________  Social History ??? Marital Status: Widowed   Spouse Name: N/A   Number of Children: N/A ??? Years of Education: N/A  Occupational History ??? Not on file.  Social History Main Topics ??? Smoking status: Current Some Day Smoker -- 0.25 packs/day for 50 years   Types: Cigarettes ??? Smokeless tobacco: Not on file ??? Alcohol Use: No ??? Drug Use: No ??? Sexual Activity: Not on file  Social History Narrative  Widowed _____________   Present Illness 79 y/o female with a h/o NICM, HTN, HL, COPD, ongoing tob abuse, and paroxysmal SVT.   She was  admitted in April of this year with adenosine sensitive SVT.  She converted to sinus with adenosine and maintained on bb therapy with recommendation to start PO dilt.  She ruled in for NSTEMI at that time.  Echo showed LV dysfxn (30-35%).  She was medically managed.  She had recurrent SVT in May and again converted with adenosine in the ER.  She f/u with Dr. Rockey Situ in June and was again advised to start dilt CD.  She says that she tried it but it made her feel as though her heart was jumping out of her chest and so she discontinued.  She says that she did not have any recurrence of SVT over the summer.  Last night at about 9 PM, she laid down for bed and noted tachypalpitations assoc with dyspnea.  After about 30 mins of ongoing Ss, she called EMS and was found to be in SVT.  She was treated with IV adenosine x 2 and then dilt 40m IV x 1 with brief conversion to sinus (EMS strips reviewed) but then return to SVT.  She was taken to the ED where she was given an additional dose of adenosine and she converted to sinus rhythm.  She was admitted and r/o for MI.  She has had no further SVT.  She denies any recent h/o chest pain, pnd, orthopnea, n, v, dizziness, syncope, or edema.  She is now willing to consider RFCA.   Physical Exam:  GEN well developed, well  nourished, no acute distress   HEENT hearing intact to voice, moist oral mucosa   NECK supple  No masses  no bruits/jvd.   RESP normal resp effort  markedly diminished breath sounds bilat.   CARD Irregular rate and rhythm  Normal, S1, S2  No murmur   ABD denies tenderness  soft  normal BS   LYMPH negative neck   EXTR negative cyanosis/clubbing, negative edema, dp/pt 1+ bilat.   SKIN normal to palpation   NEURO cranial nerves intact, motor/sensory function intact   PSYCH alert, A+O to time, place, person   Review of Systems:  Subjective/Chief Complaint palpitaions   General: No Complaints   Skin: No Complaints   ENT: No Complaints    Eyes: No Complaints   Neck: No Complaints   Respiratory: Short of breath   Cardiovascular: Palpitations  Dyspnea   Gastrointestinal: No Complaints   Genitourinary: No Complaints   Vascular: No Complaints   Musculoskeletal: Muscle or joint swelling  bilat knee pain - limits mobility.   Neurologic: No Complaints   Hematologic: No Complaints   Endocrine: No Complaints   Psychiatric: No Complaints   Review of Systems: All other systems were reviewed and found to be negative   Medications/Allergies Reviewed Medications/Allergies reviewed   Family & Social History:  Family and Social History:  Family History Negative    Social History negative tobacco   Place of Living Home        Cardiomyopathy: a. 12/2012 Echo: EF 30-35%, glob LV dysfxn w mid-dist anterior/apical, mid apical inferior, posterior WMA's. Diastolic dysfxn. Mild MR/TR.   History of Cardiac Catheterization: a. 2008 Cath: nl cors, nl EF.   GERD:    Borderline DM:    Tobacco Abuse:    Hyperlipidemia:    COPD:    Non-obstructive CAD: a. 2008 Cath - nonobs dzs   Chronic Combined Systolic and Diastolic CHF: a. 12/2681 Echo: EF 30-35%, glob LV dysfxn with mid-dist anterior/apical, mid apical inferior, posterior WMA's. Diastolic dysfxn. Mild MR/TR.   NICM: a. 12/2012 Echo: EF 30-35%, glob LV dysfxn with mid-dist anterior/apical, mid apical inferior, posterior WMA's. Diastolic dysfxn. Mild MR/TR.   irregular heartrate:    small bowel obstruction:    Depression:    Arthritis:    heartburn:    hypertension:    anxiety:    Eye Surgery - Right:    Eye Surgery - Left:    "bowel twisted":          Admit Diagnosis:   PAROXYSMAL ATRIOVENTRICULAR TACHYCARDIA.: Onset Date: 06-Jun-2014, Status: Active, Description: PAROXYSMAL ATRIOVENTRICULAR TACHYCARDIA.  Home Medications:  Medication Instructions Status  Requip 0.25 mg oral tablet 1 tab(s) orally once a day (at bedtime) Active  aspirin 81  mg oral tablet 1 tab(s) orally once a day Active  cyclobenzaprine 10 mg oral tablet 1 tab(s) orally every 8 hours, As Needed - for Pain Active  atorvastatin 20 mg oral tablet 1 tab(s) orally once a day (at bedtime) Active  LORazepam 1 mg oral tablet 1 tab(s) orally 2 times a day, As Needed for anxiety Active  diclofenac potassium 50 mg oral tablet 1 tab(s) orally 2 times a day Active  PARoxetine 10 mg oral tablet 1 tab(s) orally once a day Active  ranitidine 150 mg oral tablet 1 tab(s) orally 2 times a day Active  mirtazapine 15 mg oral tablet 1 tab(s) orally once a day (at bedtime) Active  metoprolol extended release 25 mg oral tablet, extended release 2 tab(s) orally once  a day Active   Lab Results:  Hepatic:  14-Sep-15 23:41   Bilirubin, Total 0.3  Alkaline Phosphatase 98 (46-116 NOTE: New Reference Range 04/11/14)  SGPT (ALT) 41 (14-63 NOTE: New Reference Range 04/11/14)  SGOT (AST)  74  Total Protein, Serum 7.9  Albumin, Serum  2.9  Cardiology:  14-Sep-15 23:39   Ventricular Rate 155  Atrial Rate 144  QRS Duration 136  QT 336  QTc 539  R Axis -82  T Axis 67  ECG interpretation Wide QRS tachycardia Right bundle branch block Left anterior fascicular block *** Bifascicular block *** Left ventricular hypertrophy with repolarization abnormality Cannot rule out Septal infarct , age undetermined Abnormal ECG When compared with ECG of 02-Mar-2014 18:12, Wide QRS tachycardia has replaced Sinus rhythm Vent. rate has increased BY  61 BPM ----------unconfirmed---------- Confirmed by OVERREAD, NOT ([id number]), editor PEARSON, BARBARA ([id number]) on 06/06/2014 9:50:43 AM    23:47   Ventricular Rate 96  Atrial Rate 96  P-R Interval 170  QRS Duration 142  QT 388  QTc 490  P Axis 62  R Axis -83  T Axis 72  ECG interpretation Normal sinus rhythm with sinus arrhythmia Right bundle branch block Left anterior fascicular block *** Bifascicular block *** Left ventricular hypertrophy with  repolarization abnormality Cannot rule out Septal infarct , age undetermined Abnormal ECG When compared with ECG of 05-Jun-2014 23:39, Sinus rhythm has replaced Wide QRS tachycardia Vent. rate has decreased BY  59 BPM ----------unconfirmed---------- Confirmed by OVERREAD, NOT ([id number]), editor PEARSON, BARBARA ([id number]) on 06/06/2014 9:51:00 AM  Routine Chem:  14-Sep-15 23:41   Glucose, Serum  157  BUN  24  Creatinine (comp)  1.31  Sodium, Serum 140  Potassium, Serum 4.9  Chloride, Serum  110  CO2, Serum 23  Calcium (Total), Serum 8.5  Osmolality (calc) 287  eGFR (African American)  46  eGFR (Non-African American)  39 (eGFR values <44m/min/1.73 m2 may be an indication of chronic kidney disease (CKD). Calculated eGFR is useful in patients with stable renal function. The eGFR calculation will not be reliable in acutely ill patients when serum creatinine is changing rapidly. It is not useful in  patients on dialysis. The eGFR calculation may not be applicable to patients at the low and high extremes of body sizes, pregnant women, and vegetarians.)  Result Comment POTASSIUM/CREATININE - Slight hemolysis, interpret results with MAGNESIUM/AST/CK - caution.  - TPL  Result(s) reported on 06 Jun 2014 at 12:34AM.  Anion Gap 7  Magnesium, Serum 2.2 (1.8-2.4 THERAPEUTIC RANGE: 4-7 mg/dL TOXIC: > 10 mg/dL  -----------------------)  Cardiac:  14-Sep-15 23:41   CPK-MB, Serum  < 0.5 (Result(s) reported on 06 Jun 2014 at 01:00AM.)  Troponin I < 0.02 (0.00-0.05 0.05 ng/mL or less: NEGATIVE  Repeat testing in 3-6 hrs  if clinically indicated. >0.05 ng/mL: POTENTIAL  MYOCARDIAL INJURY. Repeat  testing in 3-6 hrs if  clinically indicated. NOTE: An increase or decrease  of 30% or more on serial  testing suggests a  clinically important change)  CK, Total 113 (26-192 NOTE: NEW REFERENCE RANGE  10/24/2013)  15-Sep-15 03:46   Troponin I 0.02 (0.00-0.05 0.05 ng/mL or less: NEGATIVE  Repeat  testing in 3-6 hrs  if clinically indicated. >0.05 ng/mL: POTENTIAL  MYOCARDIAL INJURY. Repeat  testing in 3-6 hrs if  clinically indicated. NOTE: An increase or decrease  of 30% or more on serial  testing suggests a  clinically important change)    08:15   Troponin I < 0.02 (  0.00-0.05 0.05 ng/mL or less: NEGATIVE  Repeat testing in 3-6 hrs  if clinically indicated. >0.05 ng/mL: POTENTIAL  MYOCARDIAL INJURY. Repeat  testing in 3-6 hrs if  clinically indicated. NOTE: An increase or decrease  of 30% or more on serial  testing suggests a  clinically important change)  Routine Hem:  14-Sep-15 23:41   WBC (CBC) 10.9  RBC (CBC) 4.08  Hemoglobin (CBC) 12.0  Hematocrit (CBC) 38.2  Platelet Count (CBC) 243 (Result(s) reported on 06 Jun 2014 at 12:22AM.)  MCV 94  MCH 29.5  MCHC  31.5  RDW  15.4   EKG:  EKG Interp. by me   Interpretation EKG on arrival showed SVT, 155, RBBB, LAFB, poor R progression, no acute st/t changes.   Radiology Results:  XRay:    15-Sep-15 00:19, Chest Portable Single View  Chest Portable Single View   REASON FOR EXAM:    Chest pain  COMMENTS:       PROCEDURE: DXR - DXR PORTABLE CHEST SINGLE VIEW  - Jun 06 2014 12:19AM     CLINICAL DATA:  Chest pain.    EXAM:  PORTABLE CHEST - 1 VIEW    COMPARISON:  Chest radiograph performed 02/18/2014    FINDINGS:  The lungs are well-aerated and clear. There is no evidence of focal  opacification, pleural effusion or pneumothorax.  The cardiomediastinal silhouette is borderline normal in size. No  acute osseous abnormalities are seen. External pacing pads are  partially imaged.     IMPRESSION:  No acute cardiopulmonary process seen.      Electronically Signed    By: Garald Balding M.D.    On: 06/06/2014 00:56         Verified By: JEFFREY . CHANG, M.D.,    Sulfa drugs: Hives  Benadryl: Hives  Norco: Itching  Ace Inhibitors: Cough  Vital Signs/Nurse's Notes: **Vital Signs.:   15-Sep-15  03:20  Vital Signs Type Admission  Temperature Temperature (F) 97.7  Celsius 36.5  Temperature Source oral  Pulse Pulse 84  Respirations Respirations 22  Systolic BP Systolic BP 681  Diastolic BP (mmHg) Diastolic BP (mmHg) 70  Mean BP 89  Pulse Ox % Pulse Ox % 98  Pulse Ox Activity Level  At rest  Oxygen Delivery Room Air/ 21 %    Impression 1.  PSVT:   Pt presented last night with recurrent SVT.  This is her third hospital visit since April with SVT.  She has been maintained on toprol xl 83m bid @ home and says that she did not tolerate the addition of diltiazem CD.  In the past she has been reluctant to consider RFCA however after discussion today, she says that she didn't realize that the procedure was percutaneous and usually just an overnight stay.   ----->She expresses a wish to move forward with evaluation for RFCA and as a  result, I have arranged for her to [place] [facility] [place] [place] @ 9[place].   --Would increase the BB to 568mBID.   Though antiarrhythmic therapy has been discussed with her in the past, in light of possible RFCA, will hold off on adding antiarrhythmic @ this time as it will only serve to make SVT more difficult to induce.  2.  NICM:   EF 30-35% by echo in April.  Euvolemic on exam.   --Cont metoprolol succinate - increase to 5078mID.   She is not on ACEI 2/2 h/o cough.  Will  need to consider adding ARB as outpt if BP stable on higher dose of BB.  3.  HTN:   Stable. Med changes as above  4.  HL:   Cont statin.  5.  Tob Abuse/COPD:   Smoking cessation advised.  She says that she will probably try a nicotine patch b/c she has one on now and has not had any cravings.   Electronic Signatures: Rogelia Mire (NP)  (Signed 15-Sep-15 10:59)  Authored: General Aspect/Present Illness, History and Physical Exam, Review of System, Home Medications, Labs, EKG , Allergies, Vital Signs/Nurse's Notes, Impression/Plan Ida Rogue  (MD)  (Signed 15-Sep-15 13:35)  Authored: General Aspect/Present Illness, History and Physical Exam, Review of System, Family & Social History, Past Medical History, Health Issues, Home Medications, EKG , Radiology, Impression/Plan  Co-Signer: General Aspect/Present Illness, Home Medications, Allergies   Last Updated: 15-Sep-15 13:35 by Ida Rogue (MD)

## 2015-01-13 NOTE — Consult Note (Signed)
General Aspect 79 y/o female with a h/o cardiomyopathy and HTN who presented to the ED yesterday with tachypalpitations and SVT and has been found to have a mild troponin elevation.   Present Illness . 79 y/o female with a prior h/o cardiomyoapthy/chronic combined CHF (EF 30-35%, diast dysfxn by echo 12/2012), HTN, HL, COPD, tob abuse, borderline DM, RA, and GERD.  She had a cath in 2008 which showed nl LV fxn and nl cors but was admitted in 12/2012 with bronchitis/COPD flare and was noted at that time to have a mild trop elevation.  Echo then showed EF of 30-35%.  In the setting of acute resp illness, further ischemic eval was deferred.  Since then, she has done reasonably well.  She notes that she occasionally experiences tachypalpitations, which self-terminate.  Yesterday however, she awoke with tachypalps,dyspnea, and chest pain.  When this did not improve, she called EMS and was found to be in SVT with rates in the 170's.  She says that she was treated in the field (with adenosine) and she converted to sinus rhythm.    She was taken into the ED where troponin was elevated @ 0.12.  D dimer was also elevated.  CTA of the chest was performed and did not show any evidence of PE, though a 1.7 cm pulmonary nodule was noted @ the anterior medial base of the left lower lobe and a 3 cm nodule of the left lobe of the thyroid were found.  She was admitted for further eval.  Trop peaked @ 0.23 and trended down to 0.21 last night.  She has had no further chest pain since she broke from SVT.  TSH and Lytes wnl.  She denies any prior h/o c/p, dyspnea, pnd, orthopnea, n, v, dizziness, syncope, or edema.   Physical Exam:  GEN well developed, Pleasant, nad, aaox3.   HEENT pink conjunctivae, hearing intact to voice, moist oral mucosa   NECK supple  No masses   RESP normal resp effort  bibasilar crackles.   CARD Regular rate and rhythm  Normal, S1, S2  No murmur   ABD denies tenderness  soft  bs+x4.   LYMPH  negative neck   EXTR negative cyanosis/clubbing, negative edema   SKIN normal to palpation   NEURO follows commands, motor/sensory function intact   PSYCH alert, A+O to time, place, person   Review of Systems:  Subjective/Chief Complaint SOB ("i need an inhaler")   General: No Complaints   Skin: No Complaints   ENT: No Complaints   Eyes: No Complaints   Neck: No Complaints   Respiratory: Short of breath   Cardiovascular: Chest pain or discomfort  Palpitations   Gastrointestinal: No Complaints   Genitourinary: No Complaints   Vascular: No Complaints   Musculoskeletal: Muscle or joint swelling  bilat knee pain - chronic   Neurologic: No Complaints   Hematologic: No Complaints   Endocrine: No Complaints   Psychiatric: No Complaints   ROS Pt not able to provide ROS   Medications/Allergies Reviewed Medications/Allergies reviewed   Family & Social History:  Family and Social History:  Family History negative for premature CAD.   Social History positive  tobacco, negative ETOH, negative Illicit drugs, Has been smoking since age 89.  Still smoking 1/2 ppd.   + Tobacco Current (within 1 year)   Place of Living Home     Cardiomyopathy: a. 12/2012 Echo: EF 30-35%, glob LV dysfxn w mid-dist anterior/apical, mid apical inferior, posterior WMA's. Diastolic dysfxn. Mild  MR/TR.   History of Cardiac Catheterization: a. 2008 Cath: nl cors, nl EF.   GERD:    Borderline DM:    Tobacco Abuse:    Hyperlipidemia:    COPD:    Non-obstructive CAD: a. 2008 Cath - nonobs dzs   Chronic Combined Systolic and Diastolic CHF: a. 03/8675 Echo: EF 30-35%, glob LV dysfxn with mid-dist anterior/apical, mid apical inferior, posterior WMA's. Diastolic dysfxn. Mild MR/TR.   small bowel obstruction:    Depression:    Arthritis:    hypertension:    anxiety:        Admit Diagnosis:   SUPRAVENTRICULAR TACHYCARDIA: Onset Date: 11-Jan-2014, Status: Active, Description:  SUPRAVENTRICULAR TACHYCARDIA  Home Medications: Medication Instructions Status  atorvastatin 20 mg oral tablet 1 tab(s) orally once a day (at bedtime) Active  omeprazole 40 mg oral delayed release capsule 1 cap(s) orally once a day Active  hydrochlorothiazide 25 mg oral tablet 1 tab(s) orally once a day Active  traMADol 50 mg oral tablet 1 tab(s) orally 2 times a day, As Needed - for Pain Active  metoprolol succinate 25 mg oral tablet, extended release 1 tab(s) orally once a day Active  LORazepam 1 mg oral tablet 1 tab(s) orally 2 times a day, As Needed for anxiety Active  mirtazapine 15 mg oral tablet 1 tab(s) orally once a day (at bedtime) Active  hydroxychloroquine 200 mg oral tablet 1 tab(s) orally once a day Active  Aspirin Low Dose 81 mg oral delayed release tablet 1 tab(s) orally once a day Active   Lab Results:  Thyroid:  22-Apr-15 05:33   Thyroid Stimulating Hormone 1.26 (0.45-4.50 (International Unit)  ----------------------- Pregnant patients have  different reference  ranges for TSH:  - - - - - - - - - -  Pregnant, first trimetser:  0.36 - 2.50 uIU/mL)  Routine Chem:  22-Apr-15 05:33   Glucose, Serum 78  BUN 13  Creatinine (comp) 0.90  Sodium, Serum 141  Potassium, Serum 3.8  Chloride, Serum  113  CO2, Serum 24  Calcium (Total), Serum 8.6  Anion Gap  4  Osmolality (calc) 280  eGFR (African American) >60  eGFR (Non-African American) >60 (eGFR values <38m/min/1.73 m2 may be an indication of chronic kidney disease (CKD). Calculated eGFR is useful in patients with stable renal function. The eGFR calculation will not be reliable in acutely ill patients when serum creatinine is changing rapidly. It is not useful in  patients on dialysis. The eGFR calculation may not be applicable to patients at the low and high extremes of body sizes, pregnant women, and vegetarians.)  Magnesium, Serum 1.9 (1.8-2.4 THERAPEUTIC RANGE: 4-7 mg/dL TOXIC: > 10 mg/dL   -----------------------)  Cardiac:  21-Apr-15 15:26   Troponin I  0.12 (0.00-0.05 0.05 ng/mL or less: NEGATIVE  Repeat testing in 3-6 hrs  if clinically indicated. >0.05 ng/mL: POTENTIAL  MYOCARDIAL INJURY. Repeat  testing in 3-6 hrs if  clinically indicated. NOTE: An increase or decrease  of 30% or more on serial  testing suggests a  clinically important change)    20:38   Troponin I  0.23 (0.00-0.05 0.05 ng/mL or less: NEGATIVE  Repeat testing in 3-6 hrs  if clinically indicated. >0.05 ng/mL: POTENTIAL  MYOCARDIAL INJURY. Repeat  testing in 3-6 hrs if  clinically indicated. NOTE: An increase or decrease  of 30% or more on serial  testing suggests a  clinically important change)    23:32   Troponin I  0.21 (0.00-0.05 0.05 ng/mL or less:  NEGATIVE  Repeat testing in 3-6 hrs  if clinically indicated. >0.05 ng/mL: POTENTIAL  MYOCARDIAL INJURY. Repeat  testing in 3-6 hrs if  clinically indicated. NOTE: An increase or decrease  of 30% or more on serial  testing suggests a  clinically important change)  Routine Hem:  22-Apr-15 05:33   WBC (CBC) 7.0  RBC (CBC)  3.77  Hemoglobin (CBC)  11.6  Hematocrit (CBC) 35.6  Platelet Count (CBC) 196  MCV 94  MCH 30.7  MCHC 32.5  RDW  16.2  Neutrophil % 64.4  Lymphocyte % 27.0  Monocyte % 5.9  Eosinophil % 2.2  Basophil % 0.5  Neutrophil # 4.5  Lymphocyte # 1.9  Monocyte # 0.4  Eosinophil # 0.2  Basophil # 0.0 (Result(s) reported on 11 Jan 2014 at 06:37AM.)   EKG:  EKG Interp. by me   Interpretation EKG shows Sinus tach, 110, pac, RBBB, LAD, LAFB, poor r prog - no acute ST/T changes.   Radiology Results: XRay:    21-Apr-15 15:36, Chest Portable Single View  Chest Portable Single View   REASON FOR EXAM:    Chest pain  COMMENTS:       PROCEDURE: DXR - DXR PORTABLE CHEST SINGLE VIEW  - Jan 10 2014  3:36PM     CLINICAL DATA:  Chest pain    EXAM:  PORTABLE CHEST - 1 VIEW    COMPARISON:   12/23/2013    FINDINGS:  The heart size and mediastinal contours are within normal limits.  Both lungs are clear. The visualized skeletal structures are  unremarkable.     IMPRESSION:  No active disease.      Electronically Signed    By: Franchot Gallo M.D.    On: 01/10/2014 15:40         Verified By: Truett Perna, M.D.,    Sulfa drugs: Hives  Benadryl: Hives  Norco: Itching  Vital Signs/Nurse's Notes: **Vital Signs.:   22-Apr-15 05:19  Vital Signs Type Routine  Temperature Temperature (F) 98.4  Celsius 36.8  Temperature Source oral  Pulse Pulse 90  Respirations Respirations 18  Systolic BP Systolic BP 921  Diastolic BP (mmHg) Diastolic BP (mmHg) 83  Mean BP 100  Pulse Ox % Pulse Ox % 97  Pulse Ox Activity Level  At rest  Oxygen Delivery Room Air/ 21 %  *Intake and Output.:   22-Apr-15 05:20  Current Weight (lbs) (lbs) 158.7    Impression 1.  SVT:   Maintaining sinus.  She takes Toprol XL 70m daily @ home.  Resting HR 90's.  We will titrate this to 550mdaily.  TSH/Lytes OK.  If she has recurrence of SVT despite more BB, we will refer to EP as an outpt for consideration of EPS and RFCA.  2.  Type II NSTEMI:   slight trop elevation in setting of SVT.  She has a prior h/o nl cors by cath in 2008 with subsequent finding of depressed EF and multiple WMA's on echo in 12/2012.  ECG is also abnl and suggestive of prior septal infarct.   --Cont asa, bb, statin.  As the root of her cardiomyopathy was never elucidated, she will require a myoview to r/o ischemia.  This can be done as an outpt.  3.  Cardiomyopathy/Chronic combined syst/diast CHF:  in the setting of ongoing IVF, she does have mild volume overload on exam today.   -We will stop IVF and provide 1 dose of IV lasix.   Cont BB (titrated  as above).   She is not currently on an ACEI/ARB.  She reports a prior h/o cough with a medication - suspect that this was an ACEI.  Will see how her BP does with higher dose BB  and plan to add ARB.   --We discussed the usage of prn lasix for wt gain/volume excess.  Not sure that she will be able to comply with that regimen.  She is on HCTZ at home, if BP allows, we will cont this but may need to d/c to make BP room for additional BB in setting of SVT.  If stable, may add back low dose diuretic.  4.  HTN:  stable.Changes as above  5.  HL:   on statin.  6.  Lung Nodule:   will require outpt f/u.  7.  Tob Abuse/COPD cessation advised. She is requesting albuterol for home for SOB sx. She was on this in teh past and it worked well.   Electronic Signatures: Rogelia Mire (NP)  (Signed 22-Apr-15 11:53)  Authored: General Aspect/Present Illness, History and Physical Exam, Review of System, Family & Social History, Past Medical History, Home Medications, Labs, EKG , Allergies, Vital Signs/Nurse's Notes, Impression/Plan Ida Rogue (MD)  (Signed 22-Apr-15 13:15)  Authored: General Aspect/Present Illness, History and Physical Exam, Review of System, Past Medical History, Health Issues, Home Medications, Labs, EKG , Radiology, Impression/Plan  Co-Signer: General Aspect/Present Illness, History and Physical Exam, Review of System, Family & Social History, Past Medical History, Home Medications, Labs, EKG , Allergies, Vital Signs/Nurse's Notes, Impression/Plan   Last Updated: 22-Apr-15 13:15 by Ida Rogue (MD)

## 2015-01-13 NOTE — Consult Note (Signed)
Psychiatry: Follow-up note for this patient whom I saw yesterday and diagnosed with major depression.  Prior to her discharge today I had a chance to speak with her again.  She continues to endorse the symptoms she had of depression but looks a little bit stronger and less upset today.  She is pleased to be going home.  She denies suicidal ideation and does not appear to be an acute danger to herself.  I explained to her again how I discontinued the mirtazapine and started her on 20 mg of Cymbalta.  She told me that she had a headache again this morning.  I don't know whether the Cymbalta could be responsible for that but I encouraged her to continue trying to take it.  I hope that an appointment was made for another referral was at least made for psychiatric appointment the way I ordered it.  Patient needs to follow-up with a psychiatrist.  I explained this to her again and encouraged her not to give up on treatment. Major depressive episode single severe.  Electronic Signatures: Remi Rester, Madie Reno (MD)  (Signed on 23-Apr-15 15:32)  Authored  Last Updated: 23-Apr-15 15:32 by Gonzella Lex (MD)

## 2015-01-13 NOTE — Op Note (Signed)
PATIENT NAME:  [name] [name], [name] [name] MR#:  [id number] DATE OF BIRTH:  11-Dec-1935  DATE OF PROCEDURE:  11/14/2013  PREOPERATIVE DIAGNOSIS: Visually significant cataract of the right eye.   POSTOPERATIVE DIAGNOSIS: Visually significant cataract of the right eye.   OPERATIVE PROCEDURE: Cataract extraction by phacoemulsification with implant of intraocular lens to right eye.   SURGEON: Birder Robson, MD.   ANESTHESIA:  1. Managed anesthesia care.  2. Topical tetracaine drops followed by 2% Xylocaine jelly applied in the preoperative holding area.   COMPLICATIONS: None.   TECHNIQUE:  Stop and chop.   DESCRIPTION OF PROCEDURE: The patient was examined and consented in the preoperative holding area where the aforementioned topical anesthesia was applied to the right eye and then brought back to the Operating Room where the right eye was prepped and draped in the usual sterile ophthalmic fashion and a lid speculum was placed. A paracentesis was created with the side port blade and the anterior chamber was filled with viscoelastic. A near clear corneal incision was performed with the steel keratome. A continuous curvilinear capsulorrhexis was performed with a cystotome followed by the capsulorrhexis forceps. Hydrodissection and hydrodelineation were carried out with BSS on a blunt cannula. The lens was removed in a stop and chop technique and the remaining cortical material was removed with the irrigation-aspiration handpiece. The capsular bag was inflated with viscoelastic and the Tecnis ZCB00 20.0-diopter lens, serial number [id number] was placed in the capsular bag without complication. The remaining viscoelastic was removed from the eye with the irrigation-aspiration handpiece. The wounds were hydrated. The anterior chamber was flushed with Miostat and the eye was inflated to physiologic pressure. 0.1 mL of cefuroxime concentration 10 mg/mL was placed in the anterior chamber. The wounds were found to be  water tight. The eye was dressed with Vigamox. The patient was given protective glasses to wear throughout the day and a shield with which to sleep tonight. The patient was also given drops with which to begin a drop regimen today and will follow-up with me in one day.      ____________________________ Livingston Diones. Trevontae Lindahl, MD wlp:[id number] D: 11/14/2013 11:41:28 ET T: 11/14/2013 12:22:59 ET JOB#: [id number]  cc: Zigmund Linse L. Birttany Dechellis, MD, <Dictator> Livingston Diones Keandra Medero MD ELECTRONICALLY SIGNED 11/18/2013 16:10

## 2015-01-13 NOTE — Consult Note (Signed)
PATIENT NAME:  [name] [name], [name] [name] MR#:  [id number] DATE OF BIRTH:  [birth year]  DATE OF CONSULTATION:  01/11/2014  REFERRING PHYSICIAN:   CONSULTING PHYSICIAN:  Gonzella Lex, MD  IDENTIFYING INFORMATION AND REASON FOR CONSULT: A 79 year old woman currently in the hospital for congestive heart failure who was showing signs of depression. Consultation for psychiatric evaluation.   HISTORY OF PRESENT ILLNESS: Information obtained from the patient and the chart. The patient describes her mood as being depressed and says that she has been depressed for at least a year. At first, she told me she had been depressed ever since her husband died 5 years ago but then she clarified that has really been unremitting for the past year since her daughter died. Her mood stays down all of the time. She has frequent crying spells. She has no interest in any of the activities that she used to do. She feels overwhelmed by her pain. She denies having any suicidal ideation and says that she sleeps okay with her current medicine. Appetite has been okay. She denies any psychotic symptoms. She feels hopeless about the chances of getting better. Energy level is low. The patient has talked to her primary care doctor and has been given some medicine for depression but has only been taking it for about a month. From what I can tell, it is mirtazapine 15 mg at night. She is also taking some p.r.n. benzodiazepines.   PAST PSYCHIATRIC HISTORY: States that she had never had any treatment for any psychiatric illness before a year ago. Never had any hospitalization. No history of suicide attempts. No history of violence. No history of substance abuse.   SOCIAL HISTORY: The patient's husband died about 5 years ago and an adult daughter died of cancer a year ago. She is currently living with another daughter but says they do not talk very much. She seems to be estranged from most of her family. Spends most of her time sitting around doing  nothing. The patient has worked in a [employer] in the past. Used to go to church regularly and have friends.   MEDICAL HISTORY: She has arthritis which causes her a great deal of pain, particularly in her knees and ankles. She has congestive heart failure for which she is currently in the hospital. Also appears to have high blood pressure, gastric reflux symptoms.   SUBSTANCE ABUSE HISTORY: Denies any alcohol or drug use currently or in the past.   FAMILY HISTORY: No known family history of mental illness.   CURRENT MEDICATIONS: Medicines include atorvastatin 20 mg at night, hydrochlorothiazide 25 mg per day, Plaquenil 200 mg per day, metoprolol XL 50 mg per day, pantoprazole 40 mg a day, 81 mg aspirin per day, furosemide currently being given IV, tramadol p.r.n. for pain.   ALLERGIES: BENADRYL, [place] AND SULFA DRUGS.   REVIEW OF SYSTEMS: Endorses depressed mood, anhedonia, low energy, hopelessness, crying spells. Denies suicidal ideation. Denies hallucinations. Denies paranoia. Severe pain, especially in her lower extremities, shortness of breath. The rest of the review of systems negative.   MENTAL STATUS EXAM: Elderly woman, looks to be chronically ill, interviewed in her hospital room. The patient was cooperative with the interview. Eye contact intermittent. Psychomotor activity a little sluggish. Speech was quiet and slow but fluent and complete. Affect flat with crying spells. Mood stated as depressed. Thoughts appeared to be lucid, although slow. No obvious delusions or loosening of associations. Denies auditory or visual hallucinations. She denies any suicidal thoughts. Denies homicidal ideation.  The patient is alert and oriented x 4. She could recall 3 out of 3 objects immediately and at 3 minutes. Cognitively seems slow but otherwise generally intact.   LABORATORY RESULTS: Nothing particularly significant relating to current psychiatric condition. Chemistry panel largely unremarkable. Does not  appear to be anemic.   ASSESSMENT: This is a 78 year old woman who has multiple symptoms consistent with major depression. Stared out it sounds like as a grieving with the illness and death of her daughter but after a year has become overwhelming. No enjoyment in her life. No feelings of hopefulness. No bright spots or positive feelings towards the future. The patient is denying any suicidal ideation. Does not appear to be psychotic. Currently, she had been receiving mirtazapine 15 mg at night. This dose is usually inadequate for antidepressant treatment, although it is possible that in elderly patients lower doses may be adequate. At this point, it clearly is not helping. She also says that it has been getting her bad headaches. I had initially considered suggesting we increase the dose of it, but when she told me about the side effects, decided to change to something else.   TREATMENT PLAN: The patient was educated about depression and the treatability of her condition. She was educated that she can feel a great deal better if she has patience and follows up with treatment. I am going to propose we discontinue the mirtazapine as it has been causing side effects. Instead, I am going to start her on Cymbalta 20 mg a day. Advantages of this may be some assistance with her pain. Possible complications I would foresee could be the cost of it and the need to titrate up the dose. However, I think the pain is a big enough issue that it is reasonable to try the Cymbalta next. The patient was educated about side effects and agrees to the plan. She really ought to see a psychiatrist for followup treatment. I have recommended that before leaving they call our outpatient office in [facility] and see if she can get an appointment with Dr. Annitta Jersey who I believe is still taking patients and takes Medicare.   DIAGNOSIS, PRINCIPAL AND PRIMARY:  AXIS I: Major depression, severe, single episode.   SECONDARY  DIAGNOSES:  AXIS I: No further diagnosis.  AXIS II: No diagnosis.  AXIS III: Congestive heart failure, chronic severe pain, high blood pressure.  AXIS IV: Severe from multiple family losses.  AXIS V: Functioning at time of evaluation 37.   ____________________________ Gonzella Lex, MD jtc:gb D: 01/11/2014 22:47:54 ET T: 01/11/2014 23:36:34 ET JOB#: [id number]  cc: Gonzella Lex, MD, <Dictator> Gonzella Lex MD ELECTRONICALLY SIGNED 01/12/2014 10:21

## 2015-01-13 NOTE — H&P (Signed)
PATIENT NAME:  [name] [name], [name] [name] MR#:  [id number] DATE OF BIRTH:  [birth year]  DATE OF ADMISSION:  01/10/2014  ADMITTING PHYSICIAN: Gladstone Lighter, MD  PRIMARY CARE PHYSICIAN: Kathrynn Humble. Dollene Primrose, MD  PRIMARY CARDIOLOGIST: Minna Merritts, MD  CHIEF COMPLAINT: Difficulty breathing, palpitations and chest pain.   HISTORY OF PRESENT ILLNESS: [name] [name] is a 79 year old African American female with past medical history significant for rheumatoid arthritis, hypertension, anxiety, tobacco use disorder, who presents to the hospital secondary to onset of chest pain and palpitations since this morning. The patient said she woke up, she noted that her heart was fluttering in the chest, associated with some chest pain. Could not breathe but the episode was intermittent where the pain was getting intense and then was getting better, but got worse by this evening so presented to the ER. She was noted to be having supraventricular tachycardia with heart rate in the 170s when she came in and that reversed with a dose of adenosine here. Her symptoms have improved now but her first troponin is slightly elevated at 0.12. Because she is still anxious and because of family's request, she is being admitted under observation. Also of note, incidentally a 1.7 cm pulmonary nodule was noted in the left lower lobe of the lung which needs further outpatient followup.   PAST MEDICAL HISTORY: 1.  Tobacco use disorder.  2.  Hypertension.  3.  Anxiety.  4.  Rheumatoid arthritis.  5.  Osteoarthritis.   PAST SURGICAL HISTORY: 1.  Surgery for bowel obstruction.  2.  Tubal ligation.  3.  Cardiac catheterization in 2008 which was normal.  4.  Bilateral cataract surgery.   ALLERGIES TO MEDICATIONS: BENADRYL AND [place] AND SULFA DRUGS.   CURRENT HOME MEDICATIONS:  1.  Aspirin 81 mg p.o. daily.  2.  Atorvastatin 20 mg p.o. daily.  3.  Hydrochlorothiazide 25 mg p.o. daily.  4.  Hydroxychloroquine 200 mg p.o. daily.   5.  Lorazepam 1 mg p.o. b.i.d. p.r.n. for anxiety.  6.  Toprol 25 mg p.o. daily.  7.  Remeron 15 mg at bedtime.  8.  Omeprazole 40 mg p.o. daily.  9.  Tramadol 50 mg p.o. b.i.d. p.r.n. for pain.   SOCIAL HISTORY: Lives at home by herself, usually steady on her feet and uses a walker. The patient continues to smoke 1 pack per day. Denies any alcohol or drug abuse.   FAMILY HISTORY: No history of any heart disease or cancer in the family.   REVIEW OF SYSTEMS: CONSTITUTIONAL: No fever, fatigue or weakness.  EYES: No blurred vision, double vision, inflammation or glaucoma. Had cataract surgery done.  ENT: No tinnitus, ear pain, hearing loss, epistaxis or discharge.  RESPIRATORY: No cough, wheeze, hemoptysis or chronic obstructive pulmonary disease.  CARDIOVASCULAR: Positive for chest pain, palpitations, dyspnea on exertion. No syncope. Positive for arrhythmia.   GASTROINTESTINAL:  No nausea, vomiting, diarrhea, abdominal pain, hematemesis or melena.  GENITOURINARY: No dysuria, hematuria, renal calculus, frequency or incontinence.  ENDOCRINE: No polyuria, nocturia, thyroid problems, heat or cold intolerance.  HEMATOLOGY: No anemia, easy bruising or bleeding.  SKIN: No acne, rash or lesions.  MUSCULOSKELETAL: Positive for arthritis, bilateral knee pain, also had rheumatoid arthritis. No gout.  NEUROLOGIC: No numbness, weakness, CVA, transient ischemic attack or seizures.  PSYCHOLOGICAL: History of anxiety present.  No insomnia or depression.   PHYSICAL EXAMINATION: VITAL SIGNS: Temperature 98.1 degrees Fahrenheit, pulse 112, respirations 18, blood pressure was 218/88, pulse oximetry 98% on room air.  GENERAL: Well-built,  well-nourished female lying in bed, not in any acute distress.  HEENT: Normocephalic, atraumatic. Pupils equal, round, reacting to light. Anicteric sclerae. Extraocular movements intact. Oropharynx clear without erythema, mass or exudates.  NECK: Supple. No thyromegaly, JVD  or carotid bruits. No lymphadenopathy.  LUNGS: Moving air bilaterally. No wheeze or crackles. No use of accessory muscles for breathing.  CARDIOVASCULAR: S1, S2, regular rate and rhythm. No murmurs, rubs or gallops.  ABDOMEN: Soft, nontender, nondistended. No hepatosplenomegaly. Normal bowel sounds.  EXTREMITIES: No pedal edema. No clubbing or cyanosis; 2+ dorsalis pedis pulses palpable bilaterally.  SKIN: No acne, rash or lesions.  LYMPHATICS: No cervical lymphadenopathy.  NEUROLOGIC: Cranial nerves are intact. No focal motor or sensory deficits.  PSYCHOLOGICAL: The patient is awake, alert, oriented x 3.   LABORATORY, DIAGNOSTIC AND RADIOLOGICAL DATA: 1.  WBC 7.8, hemoglobin 12.0, hematocrit 37.2, platelet count 209.  2.  Sodium 141, potassium 3.7, chloride 111, bicarbonate 28, BUN 18, creatinine 1.08, glucose 76 and calcium of 8.5.  3.  ALT 14, AST 20, alkaline phosphatase 69, total bilirubin 0.2 and albumin of 3.9.  4.  Troponin is 0.12. D-dimer is 1992. CK 166, CK-MB 2.5.  5.  Chest x-ray revealing clear lung fields, no active disease.  6.  CT of the chest done showing a 1.7 cm pulmonary nodule in the left lower lobe, could be bronchogenic carcinoma. No evidence of any PE noted. A 3 cm nodule in left lobe of thyroid gland is noted.  7.  EKG showing sinus tachycardia, bifascicular block, heart rate of 110. No acute ST-T wave abnormalities noted. Initial EKG with the EMT showing supraventricular tachycardia, heart rate in 190s seen.   ASSESSMENT AND PLAN: This is a 79 year old female with history of arthritis, tobacco use disorder, hypertension and anxiety who comes with chest pain, dyspnea and palpitation, noted to have supraventricular tachycardia, which is reversed and elevated troponin.  1.  Elevated troponin, likely demand ischemia rather than acute myocardial infarction secondary to her fast heartbeat from supraventricular tachycardia.  Will monitor on telemetry under observation and  recycle troponins. Cardiology has been consulted. Continue her baby aspirin and hold off on heparin IV at this time.  2.  Supraventricular tachycardia, now in sinus rhythm, converted after a dose of adenosine.  The patient takes Toprol at home. If needed, dose adjustments can be done. Cardiology has been consulted.  3.  Lung nodule. Counseled against smoking. Outpatient pulmonary followup needed. Could be carcinogenic.  4.  Tobacco use disorder. Counseled against smoking for 3 minutes.  5.  Rheumatoid arthritis.  Continue Plaquenil. 6.  Hypertension. Continue home medications.  7.  Gastroesophageal reflux disease. The patient is on Protonix here. 8.  CODE STATUS: Full code.   TIME SPENT ON ADMISSION: 50 minutes.   ____________________________ Gladstone Lighter, MD rk:cs D: 01/10/2014 19:35:28 ET T: 01/10/2014 20:03:20 ET JOB#: [id number]  cc: Gladstone Lighter, MD, <Dictator> Minna Merritts, MD Kathrynn Humble. Dollene Primrose, MD  Gladstone Lighter MD ELECTRONICALLY SIGNED 02/08/2014 13:46

## 2015-01-13 NOTE — Discharge Summary (Signed)
PATIENT NAME:  [name] [name], [name] [name] MR#:  [id number] DATE OF BIRTH:  1936/07/07  DATE OF ADMISSION:  01/10/2014 DATE OF DISCHARGE: 01/12/2014.   REASON FOR ADMISSION: Increased heart rate, difficulty breathing, palpitations.   DISCHARGE DIAGNOSES: 1. Supraventricular tachycardia.  2. Type II non-ST elevation myocardial infarction due to demand ischemia with slight elevation of cardiac enzymes.  3. Cardiomyopathy, chronic systolic and diastolic congestive heart failure with ejection fraction close to 30%.  4. Hypertension.  5. Hyperlipidemia.  6. Lung nodules followup with primary care physician.  7. Tobacco abuse and chronic obstructive pulmonary disease.   DISPOSITION: Home.   MEDICATIONS AT DISCHARGE: Atorvastatin 20 mg a day, omeprazole 40 mg daily, tramadol 50 mg twice daily, lorazepam 1 mg twice daily, hydroxychloroquine 200 mg once a day, aspirin 81 mg daily, metoprolol 50 mg once a day, Cymbalta 20 mg once a day, Cozaar 25 mg once a day.   FOLLOWUP: With Dr. Ida Rogue in the next 1 to 2 weeks for evaluation and continuation of treatment of SVT and CHF. Follow-up with Dr. Margret Chance primary care physician.  The patient is to follow up with her for medication management as well follow up on evaluation of the lung nodule.   The patient requires a repeat CT scan in the next 3 to 6 months for evaluation of lung cancer as she is a smoker.   IMPORTANT RESULTS: CT scan of the chest for evaluation of PE showed 1.7 cm pulmonary nodule anterior medial base of the left lower lobe. Differential considerations include bronchogenic carcinoma.  Follow up evaluation with thoracic surgery. There is a 3.3 cm nodule on the left lobe of the thyroid, also further evaluation needed.   Dr. Dollene Primrose please follow up on these 2 issues.   The patient discharged with home health.   Troponin was 0.12 on admission and went up to 0.23.   White count 7.8, hemoglobin 12, dimer was elevated. TSH is  normal at 1.26.   EKG: Sinus tachycardia also on admission, also SVT. Echocardiogram shows left ventricular ejection fraction 35% to 40% now. Previous, apparently was around 20, decreased lower left ventricular systolic function, mild left ventricular hypertrophy.   HOSPITAL COURSE: This is a very nice 79 year old female who got admitted to the hospital due to history of palpitations, chest pain. The patient woke up and noted that her chest was fluttering associated with chest pain. She was admitted to the Emergency Department, where her heart rate was 170. The patient received one dose of adenosine which breakdown the rhythm. The patient had a slight elevation of troponin at 0.12, which went to 0.23 at the highest. The patient had a pulmonary nodule noticed 1.7 cm, which required follow up outpatient. The patient did well during this hospitalization, but she had significant shortness of breath and crackles after she was started on IV fluids. Her previous echocardiogram as per cardiology had an ejection fraction was very low close to 20, now repeat echocardiogram has a ejection fraction of 35%. The patient was started on losartan as she is not able to take ACE inhibitors due to cough.   As far as SVT, the patient is going to be evaluated by cardiology with possible referral to electrophysiology. Her metoprolol was increased from 25 mg 50 mg.   Type II non-ST elevation myocardial infarction with elevation of troponins. The patient had abnormal EKG suggested prior septal infarct. No ST elevation. The patient on aspirin, beta blocker, statin, Since she has cardiomyopathy, I started on ARB.  As far as her cardiomyopathy. The patient had a little bit of fluid overload after fluids were given. A slight exacerbation of systolic and diastolic congestive heart failure, which improved after one dose of Lasix. The patient is much better. She is going to be discharged. Now, we are going to add on losartan as she  cannot take ACE inhibitors. Her blood pressure remains stable despite the fact that we increased her metoprolol.  Hyperlipidemia. Continue statin.   Lung nodule requires follow-up outpatient. Possible referral to cardiothoracic surgeon for possible biopsy. The patient was inform about these findings and possibility of this being cancer.   The patient has been counseled about quitting smoking.   TIME SPENT: I spent about 45 minutes with this discharge.    ____________________________ Hansen Sink, MD rsg:sg D: 01/12/2014 12:07:28 ET T: 01/12/2014 12:43:43 ET JOB#: [id number]  cc: [place] Sink, MD, <Dictator> Muhammad A. Fletcher Anon, MD Minna Merritts, MD Kathrynn Humble Dollene Primrose, MD  Roselie Awkward America Brown MD ELECTRONICALLY SIGNED 01/24/2014 23:09

## 2015-01-13 NOTE — H&P (Signed)
PATIENT NAME:  [name] [name], [name] [name] MR#:  [id number] DATE OF BIRTH:  July 03, 1936  DATE OF ADMISSION:  06/06/2014  REFERRING PHYSICIAN: Loney Hering, MD  PRIMARY CARE PHYSICIAN: Kathrynn Humble. Dollene Primrose, MD   PRIMARY CARDIOLOGIST: Minna Merritts, MD  CHIEF COMPLAINT: Palpitation.   HISTORY OF PRESENT ILLNESS: This is a 79 year old female who presents with known past medical history of rheumatoid arthritis, hypertension, anxiety, tobacco use disorder, who presents with complaints of palpitation. The patient was found to be in SVT. She was given adenosine x2 by EMS and Cardizem x5, with only lowering her heart rate from the 180s to 150s. In the ED, the patient was tachycardic at 150s with SVT. She was given another dose of adenosine, which converted her back to normal sinus rhythm. The patient denies any chest pain, any shortness of breath, any fever, any chills, dysuria, polyuria, hemoptysis. Reports this presentation was in April, had similar presentation with palpitation, but reports this time, she has no chest pain or shortness of breath as well. The patient is on metoprolol and reports she has been compliant with her medication, and she denies any recurrent episodes since then. Hospitalist requested to admit the patient. She does not have any significant lab abnormalities. Her magnesium was 2.2, she was given 2 grams of mag sulfate in ED.   PAST MEDICAL HISTORY:  1. Tobacco use disorder.  2. Hypertension.  3. Anxiety.  4. Rheumatoid arthritis.  5. Osteoarthritis.  6. Cardiomyopathy with EF of 35%.  7. History of SVT in the past.   PAST SURGICAL HISTORY:  1. Surgery for bowel obstruction.  2. Tubal ligation.  3. Bilateral cataract surgery.   ALLERGIES: BENADRYL, [place] AND SULFA DRUGS.   SOCIAL HISTORY: Lives at home by herself. She smokes 1/2-pack per day. No alcohol. No drug use.   FAMILY HISTORY: Significant for heart disease and cancer in the family.   HOME MEDICATION:  1.  Diclofenac 50 mg oral 2 times a day.  2. Lorazepam 1 mg oral 2 times a day as needed.  3. Mirtazapine 15 mg oral at bedtime.  4. Paxil 10 mg oral daily.  5. Atorvastatin 20 mg oral at bedtime.  6. Metoprolol extended-release 25 mg oral daily.  7. Flexeril as needed.  8. Ranitidine 150 mg oral 2 times a day.   REVIEW OF SYSTEMS:  CONSTITUTIONAL: Denies fever, chills, fatigue, weakness.  EYES: Denies blurry vision, double vision, inflammation.  ENT: Denies tinnitus, ear pain, epistaxis.  RESPIRATORY: Denies cough, wheezing, shortness of breath.  CARDIOVASCULAR: Denies chest pain, edema, syncope. Reports palpitations.  GASTROINTESTINAL: Denies nausea, vomiting, diarrhea, abdominal pain.  GENITOURINARY: Denies dysuria, hematuria or renal colic.  ENDOCRINE: Denies polyuria, polydipsia, heat or cold intolerance.  HEMATOLOGY: Denies anemia, easy bruising or bleeding.  INTEGUMENTARY: Denies acne, rash.  MUSCULOSKELETAL: Denies any swelling, gout, cramps. Reports history of rheumatoid arthritis.  NEUROLOGIC: Denies CVA, TIA, tremors, vertigo, headache.  PSYCHIATRIC: Denies anxiety, insomnia or depression.   PHYSICAL EXAMINATION:  VITAL SIGNS: Pulse 85, respiratory rate 20, blood pressure 110/73, saturating 100% on oxygen.  GENERAL: Well-nourished female, looks comfortable in bed, in no apparent distress.  HEENT: Head atraumatic, normocephalic. Pupils equally reactive to light. Pink conjunctivae. Anicteric sclerae. Moist oral mucosa. No nasal discharge.  NECK: Supple. No thyromegaly. No JVD. No carotid bruits. Trachea is midline.  CHEST: Good air entry bilaterally. No wheezing, rales, rhonchi. No use of accessory muscles.  CARDIOVASCULAR: S1, S2 heard. No rubs, murmur or gallops. PMI nondisplaced.  ABDOMEN: Soft,  nontender, nondistended. Bowel sounds present.  EXTREMITIES: No edema. No clubbing. No cyanosis. Pedal pulses felt bilaterally.  PSYCHIATRIC: Appropriate affect. Awake, alert x3.   NEUROLOGIC: Cranial nerves grossly intact. Motor 5 out of 5. Sensation symmetrical to light touch.  MUSCULOSKELETAL: No joint effusion or erythema.  SKIN: Normal skin turgor. Warm and dry.   PERTINENT LABORATORY DATA: Glucose 157, BUN 24, creatinine 1.31, sodium 140, potassium 4.9, chloride 110. Troponin less than 0.02. White blood cells 10.9, hemoglobin 12, hematocrit 38.2, platelets 243.   ASSESSMENT AND PLAN:  1. Paroxysmal supraventricular tachycardia. The patient presents with recurrent episodes of supraventricular tachycardia. Will be admitted to telemetry unit. Cycle her cardiac enzymes, follow the trend. She denies any chest pain or any shortness of breath. Will continue her on her home dose metoprolol. Will consult cardiology service.  2. Tobacco use. The patient was counseled and requests nicotine patch.  3. Hypertension. Blood pressure is on the lower side. Will continue with her Toprol and will continue IV fluids.  4. Hyperlipidemia. Continue with statin.  5. Deep vein thrombosis prophylaxis. Subcutaneous heparin. 6. Gastrointestinal prophylaxis, on famotidine.   CODE STATUS: The patient reports she does have a living will. Her son and daughter are her healthcare power of attorney, and she is a full code.   TOTAL TIME SPENT ON ADMISSION AND PATIENT CARE: 55 minutes.   ____________________________ Albertine Patricia, MD dse:lb D: 06/06/2014 02:40:59 ET T: 06/06/2014 05:49:36 ET JOB#: [id number]  cc: Albertine Patricia, MD, <Dictator> Bobbie Virden Graciela Husbands MD ELECTRONICALLY SIGNED 06/06/2014 7:12

## 2015-01-13 NOTE — Consult Note (Signed)
Brief Consult Note: Diagnosis: major depression.   Patient was seen by consultant.   Consult note dictated.   Recommend further assessment or treatment.   Orders entered.   Comments: Psychiatry: Patient seen, chart reviewed, note dictated. Patient very depressed but not suicidal. Changed antidepressant because the remeron is giving her headaches. Ordered follow up appt be made with Dr Annitta Jersey.  Electronic Signatures: Clapacs, Madie Reno (MD)  (Signed 22-Apr-15 18:24)  Authored: Brief Consult Note   Last Updated: 22-Apr-15 18:24 by Gonzella Lex (MD)

## 2015-01-13 NOTE — Discharge Summary (Signed)
PATIENT NAME:  [name] [name], [name] [name] MR#:  [id number] DATE OF BIRTH:  08-29-1936  DATE OF ADMISSION:  06/06/2014 DATE OF DISCHARGE:  06/06/2014  DISCHARGE DIAGNOSES:  1. Supraventricular tachycardia.  2. Anxiety.  3. Restless leg syndrome.  4. Depression.   DISCHARGE MEDICATIONS:  1. Atorvastatin 20 mg daily.  2. Lorazepam 1 mg oral 2 times a day as needed.  3. Diclofenac 50 mg oral 2 times a day.  4. Paroxetine 10 mg oral once a day.  5. Dilantin 150 mg oral 2 times a day.  6. Remeron 15 mg daily.  7. Flexeril 10 mg oral every 8 hours as needed for pain.  8. Metoprolol extended release 25 mg 2 tablets once a day.  9. Aspirin 81 mg daily.  10. Requip 0.25 mg oral once a day.   DISCHARGE INSTRUCTIONS: Regular food, regular consistency. Activity as tolerated. Follow up with Dr. Rockey Situ in 1-2 weeks and Dr. Rayann Heman in [place] on 06/21/2014.   IMAGING STUDIES DONE:  Include a chest x-ray portable which showed nothing acute.   ADMITTING HISTORY AND PHYSICAL: Please see detailed H and P dictated previously by Dr. Waldron Labs. In brief a 79 year old African-American female patient with prior history of SVT, presented to the hospital with palpitations, was found to have significantly elevated heart rate in the 170s and had SVT.    HOSPITAL COURSE:  SVT. The patient had 2 doses of adenosine along with dose of Cardizem with which she improved well back to normal sinus rhythm. Her metoprolol was increased during the hospital stay. Cardiology saw the patient and suggested that she get an ablation for which she has been set up as outpatient. The patient feels back to normal. No palpitations, chest pain, shortness of breath. Her metoprolol dose is being increased from 25 to 50 mg daily and is being discharged home. The patient is being started on Requip for her complaints of restless leg syndrome which she has had chronically.   PHYSICAL EXAMINATION:  LUNGS:  Prior to discharge the patient's lungs sound  clear.  HEART:  S1, S2 heard.  EXTREMITIES: No edema.   TIME SPENT ON DAY OF DISCHARGE IN DISCHARGE ACTIVITY: 40 minutes.     ____________________________ Leia Alf Carissa Musick, MD srs:bu D: 06/06/2014 16:09:31 ET T: 06/06/2014 16:22:52 ET JOB#: [id number]  cc: Alveta Heimlich R. Benyamin Jeff, MD, <Dictator> Neita Carp MD ELECTRONICALLY SIGNED 06/10/2014 14:32

## 2015-02-15 ENCOUNTER — Ambulatory Visit (INDEPENDENT_AMBULATORY_CARE_PROVIDER_SITE_OTHER): Payer: Medicare Other | Admitting: Cardiovascular Disease

## 2015-02-15 ENCOUNTER — Encounter: Payer: Self-pay | Admitting: Cardiovascular Disease

## 2015-02-15 VITALS — BP 144/84 | HR 89 | Ht 71.0 in | Wt 205.2 lb

## 2015-02-15 DIAGNOSIS — F17200 Nicotine dependence, unspecified, uncomplicated: Secondary | ICD-10-CM

## 2015-02-15 DIAGNOSIS — I10 Essential (primary) hypertension: Secondary | ICD-10-CM

## 2015-02-15 DIAGNOSIS — Z720 Tobacco use: Secondary | ICD-10-CM

## 2015-02-15 DIAGNOSIS — I471 Supraventricular tachycardia: Secondary | ICD-10-CM | POA: Diagnosis not present

## 2015-02-15 DIAGNOSIS — E785 Hyperlipidemia, unspecified: Secondary | ICD-10-CM

## 2015-02-15 DIAGNOSIS — I5042 Chronic combined systolic (congestive) and diastolic (congestive) heart failure: Secondary | ICD-10-CM

## 2015-02-15 DIAGNOSIS — R079 Chest pain, unspecified: Secondary | ICD-10-CM

## 2015-02-15 DIAGNOSIS — R0602 Shortness of breath: Secondary | ICD-10-CM

## 2015-02-15 MED ORDER — METOPROLOL SUCCINATE ER 50 MG PO TB24: 50.0000 mg | ORAL_TABLET | Freq: Two times a day (BID) | ORAL | Status: DC

## 2015-02-15 MED ORDER — AMIODARONE HCL 200 MG PO TABS: 200.0000 mg | ORAL_TABLET | Freq: Two times a day (BID) | ORAL | Status: DC

## 2015-02-15 MED ORDER — FUROSEMIDE 20 MG PO TABS: 20.0000 mg | ORAL_TABLET | Freq: Two times a day (BID) | ORAL | Status: AC | PRN

## 2015-02-15 NOTE — Assessment & Plan Note (Signed)
Encouraged her to stay on her statin, Lipitor

## 2015-02-15 NOTE — Progress Notes (Signed)
Patient ID: [name], female    DOB: Oct 25, 1935, 79 y.o.   MRN: [id number]  HPI Comments: Mariona Scholes is a 79 y.o. female with long history of smoking who continues to smoke, combined CHF (EF 46-65%, + diastolic dysfunction, multiple WMAs), HTN, HLD, COPD,  tobacco abuse, rheumatoid arthritis, borderline DM2 and GERD. She presents for routine followup of her cardiomyopathy, hypertension.  In follow-up today, she continues to have episodes of SVT She has called EMTs 6 times since 2015 for SVT, often requiring adenosine. With these episodes she has shortness of breath and chest discomfort. Last episode was April 2016. These episodes are made her feel nervous as she is often scared she will go into arrhythmia She continues to smoke daily  EKG on today's visit shows normal sinus rhythm with rate 89 bpm, PVCs and trigeminal pattern, left anterior fascicular block  Other past medical history   lung nodule seen on CT scan of the chest, 1.7 cm ACE inhibitor cough  hospital admission April 21 with discharge 01/12/2014. Diagnosed with SVT, non-ST elevation MI, cardiomyopathy with ejection fraction 30%,   Heart rate initially measured at 170 beats per minute. Given adenosine. Troponin up to 0.23. Metoprolol was increased up to 50 mg daily.  Also started on Cymbalta for depression. Depression exacerbated by loss of several family members.  Imaging in the hospital also showed lung nodules recommendation suggested with primary care. 1.7 cm nodule anterior medial base of the left lower lobe  Recent recurrence of her SVT may 32,015 with heart rate up to 200 beats per minute requiring adenosine. Evaluation in the emergency room that evening and discharged home after negative cardiac enzymes She felt that she was having an anxiety attack. Notes indicate she converted to normal sinus rhythm after adenosine was given confirming SVT.  cardiac catheterization in 2008 which revealed normal cors. She  was evaluated for tachycardia/palpitations at that time which were managed by AVN blockers.  Previously admitted to [facility] April 2014 for cough and shortness of breath attributed to bronchitis. Cardiology was consulted due to elevated cardiac enzymes (trend 0.31->0.61->0.23).   2D echo 01/18/2013: LVEF 30-35%, mod global LV systolic dysfunction, diastolic dysfunction, mid-distal anterior wall, apical and mid-apical inferior/posterior WMAs, mild MR/TR, normal RVSP.   Hgb A1C 7.3%,  LDL 96,  TC 147   She notes chronic DOE/SOB which she attributes to smoking and COPD. occasional palpitations.   Daughter  passed away from lung cancer. Other family members with medical issues as well Staying with her son at nighttime   Allergies  Allergen Reactions  . Benadryl [Diphenhydramine Hcl]   . Lisinopril Cough  . Sulfa Antibiotics Hives    Current Outpatient Prescriptions on File Prior to Visit  Medication Sig Dispense Refill  . aspirin 81 MG tablet Take 81 mg by mouth daily.    Marland Kitchen atorvastatin (LIPITOR) 20 MG tablet Take 20 mg by mouth daily.    . baclofen (LIORESAL) 10 MG tablet Take 5 mg by mouth 2 (two) times daily.    . cyclobenzaprine (FLEXERIL) 10 MG tablet Take 10 mg by mouth every 8 (eight) hours as needed for muscle spasms.    . diclofenac (VOLTAREN) 50 MG EC tablet Take 50 mg by mouth daily.    . lansoprazole (PREVACID) 30 MG capsule Take 30 mg by mouth daily at 12 noon.    Marland Kitchen LORazepam (ATIVAN) 1 MG tablet Take 1 mg by mouth 2 (two) times daily as needed for anxiety.    Marland Kitchen  mirtazapine (REMERON) 15 MG tablet Take 15 mg by mouth at bedtime.    . nitroGLYCERIN (NITROSTAT) 0.4 MG SL tablet Place 0.4 mg under the tongue every 5 (five) minutes as needed for chest pain.    Marland Kitchen ondansetron (ZOFRAN-ODT) 4 MG disintegrating tablet Take 4 mg by mouth every 8 (eight) hours as needed for nausea or vomiting.    Marland Kitchen PARoxetine (PAXIL) 10 MG tablet Take 10 mg by mouth daily.    Marland Kitchen rOPINIRole (REQUIP) 0.25 MG  tablet Take 0.25 mg by mouth at bedtime.     No current facility-administered medications on file prior to visit.    Past Medical History  Diagnosis Date  . Hypertension   . Hyperlipidemia   . COPD (chronic obstructive pulmonary disease)     a. With ongoing tobacco use.  Marland Kitchen GERD (gastroesophageal reflux disease)   . Anxiety   . Rheumatoid arthritis   . Osteoarthritis   . Hyperuricemia   . Diabetes mellitus without complication   . Chronic combined systolic and diastolic CHF, NYHA class 1     a. 12/2013 Echo: EF 30-35%, DD, mid-distal anterior wall, apical and mid-apical inferior/posterior WMAs, mild MR/TR, normal RVSP.   Marland Kitchen Renal insufficiency   . Thyroid nodule   . Lung nodule   . Tobacco abuse   . S/P cardiac cath     a. 2008 Cath: nl cors.  . SVT (supraventricular tachycardia)     a. 12/2013 & 01/2014-->broke with adenosine.  . Pulmonary nodule, left     a.  H/O 1.7 cm nodule anterior medial base of the left lower lobe, not seen on 12/2013 CXR [facility]).    Past Surgical History  Procedure Laterality Date  . Bowel obstruction    . Tubal ligation    . Cardiac catheterization  2008    [facility]    Social History  reports that she has been smoking Cigarettes.  She has a 12.5 pack-year smoking history. She does not have any smokeless tobacco history on file. She reports that she does not drink alcohol or use illicit drugs.  Family History family history includes Diabetes in her other; Hypertension in her mother and other. There is no history of Coronary artery disease or Stroke.   Review of Systems  Constitutional: Negative.   Respiratory: Negative.   Cardiovascular: Positive for palpitations.       Tachycardia  Gastrointestinal: Negative.   Musculoskeletal: Negative.   Skin: Negative.   Neurological: Negative.   Hematological: Negative.   Psychiatric/Behavioral: The patient is nervous/anxious.   All other systems reviewed and are negative.   BP 144/84 mmHg  Pulse 89   Ht '5\' 11"'$  (1.803 m)  Wt 205 lb 4 oz (93.101 kg)  BMI 28.64 kg/m2  Physical Exam  Constitutional: She is oriented to person, place, and time. She appears well-developed and well-nourished.  Obese  HENT:  Head: Normocephalic.  Nose: Nose normal.  Mouth/Throat: Oropharynx is clear and moist.  Eyes: Conjunctivae are normal. Pupils are equal, round, and reactive to light.  Neck: Normal range of motion. Neck supple. No JVD present.  Cardiovascular: Normal rate, regular rhythm, S1 normal, S2 normal and intact distal pulses.  Exam reveals no gallop and no friction rub.   Murmur heard.  Systolic murmur is present with a grade of 2/6  Pulmonary/Chest: Effort normal and breath sounds normal. No respiratory distress. She has no wheezes. She has no rales. She exhibits no tenderness.  Abdominal: Soft. Bowel sounds are normal. She exhibits  no distension. There is no tenderness.  Musculoskeletal: Normal range of motion. She exhibits no edema or tenderness.  Lymphadenopathy:    She has no cervical adenopathy.  Neurological: She is alert and oriented to person, place, and time. Coordination normal.  Skin: Skin is warm and dry. No rash noted. No erythema.  Psychiatric: She has a normal mood and affect. Her behavior is normal. Judgment and thought content normal.    Assessment and Plan  Nursing note and vitals reviewed.

## 2015-02-15 NOTE — Assessment & Plan Note (Signed)
She continues to have symptomatic SVT. Also with PVCs in a trigeminal pattern. We will start amiodarone 200 mg twice a day. Also recommended she increase metoprolol up to 50 mg twice a day She is not interested in ablation

## 2015-02-15 NOTE — Assessment & Plan Note (Signed)
She appears relatively euvolemic on today's visit.  normal ejection fraction on most recent echocardiogram.  No changes to her medication We will consider adding ACE inhibitor on her next clinic visit

## 2015-02-15 NOTE — Assessment & Plan Note (Signed)
Metoprolol increased to 50 mg twice a day We'll monitor pressure

## 2015-02-15 NOTE — Assessment & Plan Note (Signed)
We have encouraged her to continue to work on weaning her cigarettes and smoking cessation. She will continue to work on this and does not want any assistance with chantix.  

## 2015-02-15 NOTE — Patient Instructions (Addendum)
Please start amiodarone 200 mg twice a day for tachycardia  Increase the metoprolol up to one pill twice a day  Take lasix/furosemide as needed one or two a day for leg swelling With a banana  Please call us if you have new issues that need to be addressed before your next appt.  Your physician wants you to follow-up in: 6 months.  You will receive a reminder letter in the mail two months in advance. If you don't receive a letter, please call our office to schedule the follow-up appointment.

## 2015-05-18 ENCOUNTER — Encounter: Payer: Self-pay | Admitting: *Deleted

## 2015-05-18 ENCOUNTER — Ambulatory Visit: Payer: Medicare Other | Admitting: Cardiovascular Disease

## 2015-06-03 ENCOUNTER — Emergency Department
Admission: EM | Admit: 2015-06-03 | Discharge: 2015-06-03 | Disposition: A | Discharge status: Home / Self Care | Payer: Medicare Other | Attending: Emergency Medicine | Admitting: Emergency Medicine

## 2015-06-03 ENCOUNTER — Emergency Department: Payer: Medicare Other

## 2015-06-03 DIAGNOSIS — I129 Hypertensive chronic kidney disease with stage 1 through stage 4 chronic kidney disease, or unspecified chronic kidney disease: Secondary | ICD-10-CM | POA: Insufficient documentation

## 2015-06-03 DIAGNOSIS — R5383 Other fatigue: Secondary | ICD-10-CM | POA: Insufficient documentation

## 2015-06-03 DIAGNOSIS — Z79899 Other long term (current) drug therapy: Secondary | ICD-10-CM | POA: Diagnosis not present

## 2015-06-03 DIAGNOSIS — R112 Nausea with vomiting, unspecified: Secondary | ICD-10-CM

## 2015-06-03 DIAGNOSIS — R1084 Generalized abdominal pain: Secondary | ICD-10-CM

## 2015-06-03 DIAGNOSIS — Z720 Tobacco use: Secondary | ICD-10-CM | POA: Insufficient documentation

## 2015-06-03 DIAGNOSIS — Z7982 Long term (current) use of aspirin: Secondary | ICD-10-CM | POA: Insufficient documentation

## 2015-06-03 DIAGNOSIS — N189 Chronic kidney disease, unspecified: Secondary | ICD-10-CM | POA: Insufficient documentation

## 2015-06-03 DIAGNOSIS — Z791 Long term (current) use of non-steroidal anti-inflammatories (NSAID): Secondary | ICD-10-CM | POA: Diagnosis not present

## 2015-06-03 HISTORY — DX: Systemic involvement of connective tissue, unspecified: M35.9

## 2015-06-03 LAB — TROPONIN I

## 2015-06-03 LAB — CBC WITH DIFFERENTIAL/PLATELET
BASOS ABS: 0 10*3/uL (ref 0–0.1)
BASOS PCT: 0 %
EOS PCT: 1 %
Eosinophils Absolute: 0.1 10*3/uL (ref 0–0.7)
HCT: 36 % (ref 35.0–47.0)
Hemoglobin: 11.7 g/dL — ABNORMAL LOW (ref 12.0–16.0)
Lymphocytes Relative: 20 %
Lymphs Abs: 1.6 10*3/uL (ref 1.0–3.6)
MCH: 28.4 pg (ref 26.0–34.0)
MCHC: 32.5 g/dL (ref 32.0–36.0)
MCV: 87.2 fL (ref 80.0–100.0)
MONO ABS: 0.6 10*3/uL (ref 0.2–0.9)
Monocytes Relative: 7 %
Neutro Abs: 6 10*3/uL (ref 1.4–6.5)
Neutrophils Relative %: 72 %
PLATELETS: 203 10*3/uL (ref 150–440)
RBC: 4.13 MIL/uL (ref 3.80–5.20)
RDW: 15.9 % — ABNORMAL HIGH (ref 11.5–14.5)
WBC: 8.4 10*3/uL (ref 3.6–11.0)

## 2015-06-03 LAB — COMPREHENSIVE METABOLIC PANEL
ALK PHOS: 56 U/L (ref 38–126)
ALT: 9 U/L — AB (ref 14–54)
ANION GAP: 5 (ref 5–15)
AST: 14 U/L — ABNORMAL LOW (ref 15–41)
Albumin: 3 g/dL — ABNORMAL LOW (ref 3.5–5.0)
BUN: 16 mg/dL (ref 6–20)
CALCIUM: 9.1 mg/dL (ref 8.9–10.3)
CO2: 27 mmol/L (ref 22–32)
CREATININE: 1.18 mg/dL — AB (ref 0.44–1.00)
Chloride: 109 mmol/L (ref 101–111)
GFR, EST AFRICAN AMERICAN: 50 mL/min — AB (ref >60)
GFR, EST NON AFRICAN AMERICAN: 43 mL/min — AB (ref >60)
Glucose, Bld: 116 mg/dL — ABNORMAL HIGH (ref 65–99)
Potassium: 3.5 mmol/L (ref 3.5–5.1)
Sodium: 141 mmol/L (ref 135–145)
Total Bilirubin: 0.5 mg/dL (ref 0.3–1.2)
Total Protein: 6.9 g/dL (ref 6.5–8.1)

## 2015-06-03 LAB — LIPASE, BLOOD: LIPASE: 24 U/L (ref 22–51)

## 2015-06-03 MED ORDER — ONDANSETRON 4 MG PO TBDP: 4.0000 mg | ORAL_TABLET | Freq: Three times a day (TID) | ORAL | Status: DC | PRN

## 2015-06-03 MED ORDER — IOHEXOL 300 MG/ML  SOLN
100.0000 mL | Freq: Once | INTRAMUSCULAR | Status: AC | PRN
  Administered 2015-06-03: 100 mL via INTRAVENOUS

## 2015-06-03 MED ORDER — SODIUM CHLORIDE 0.9 % IV BOLUS (SEPSIS)
500.0000 mL | Freq: Once | INTRAVENOUS | Status: AC
  Administered 2015-06-03: 500 mL via INTRAVENOUS

## 2015-06-03 MED ORDER — ONDANSETRON 4 MG PO TBDP
4.0000 mg | ORAL_TABLET | Freq: Once | ORAL | Status: AC
  Administered 2015-06-03: 4 mg via ORAL
  Filled 2015-06-03: qty 1

## 2015-06-03 MED ORDER — MORPHINE SULFATE (PF) 2 MG/ML IV SOLN
2.0000 mg | Freq: Once | INTRAVENOUS | Status: AC
  Administered 2015-06-03: 2 mg via INTRAVENOUS
  Filled 2015-06-03: qty 1

## 2015-06-03 MED ORDER — LORAZEPAM 2 MG/ML IJ SOLN
1.0000 mg | Freq: Once | INTRAMUSCULAR | Status: AC
  Administered 2015-06-03: 1 mg via INTRAVENOUS
  Filled 2015-06-03: qty 1

## 2015-06-03 MED ORDER — IOHEXOL 240 MG/ML SOLN
25.0000 mL | Freq: Once | INTRAMUSCULAR | Status: AC | PRN
  Administered 2015-06-03: 25 mL via ORAL

## 2015-06-03 NOTE — ED Notes (Signed)
Patient transported to CT 

## 2015-06-03 NOTE — Discharge Instructions (Signed)
Abdominal Pain, Women °Abdominal (stomach, pelvic, or belly) pain can be caused by many things. It is important to tell your doctor: °· The location of the pain. °· Does it come and go or is it present all the time? °· Are there things that start the pain (eating certain foods, exercise)? °· Are there other symptoms associated with the pain (fever, nausea, vomiting, diarrhea)? °All of this is helpful to know when trying to find the cause of the pain. °CAUSES  °· Stomach: virus or bacteria infection, or ulcer. °· Intestine: appendicitis (inflamed appendix), regional ileitis (Crohn's disease), ulcerative colitis (inflamed colon), irritable bowel syndrome, diverticulitis (inflamed diverticulum of the colon), or cancer of the stomach or intestine. °· Gallbladder disease or stones in the gallbladder. °· Kidney disease, kidney stones, or infection. °· Pancreas infection or cancer. °· Fibromyalgia (pain disorder). °· Diseases of the female organs: °· Uterus: fibroid (non-cancerous) tumors or infection. °· Fallopian tubes: infection or tubal pregnancy. °· Ovary: cysts or tumors. °· Pelvic adhesions (scar tissue). °· Endometriosis (uterus lining tissue growing in the pelvis and on the pelvic organs). °· Pelvic congestion syndrome (female organs filling up with blood just before the menstrual period). °· Pain with the menstrual period. °· Pain with ovulation (producing an egg). °· Pain with an IUD (intrauterine device, birth control) in the uterus. °· Cancer of the female organs. °· Functional pain (pain not caused by a disease, may improve without treatment). °· Psychological pain. °· Depression. °DIAGNOSIS  °Your doctor will decide the seriousness of your pain by doing an examination. °· Blood tests. °· X-rays. °· Ultrasound. °· CT scan (computed tomography, special type of X-ray). °· MRI (magnetic resonance imaging). °· Cultures, for infection. °· Barium enema (dye inserted in the large intestine, to better view it with  X-rays). °· Colonoscopy (looking in intestine with a lighted tube). °· Laparoscopy (minor surgery, looking in abdomen with a lighted tube). °· Major abdominal exploratory surgery (looking in abdomen with a large incision). °TREATMENT  °The treatment will depend on the cause of the pain.  °· Many cases can be observed and treated at home. °· Over-the-counter medicines recommended by your caregiver. °· Prescription medicine. °· Antibiotics, for infection. °· Birth control pills, for painful periods or for ovulation pain. °· Hormone treatment, for endometriosis. °· Nerve blocking injections. °· Physical therapy. °· Antidepressants. °· Counseling with a psychologist or psychiatrist. °· Minor or major surgery. °HOME CARE INSTRUCTIONS  °· Do not take laxatives, unless directed by your caregiver. °· Take over-the-counter pain medicine only if ordered by your caregiver. Do not take aspirin because it can cause an upset stomach or bleeding. °· Try a clear liquid diet (broth or water) as ordered by your caregiver. Slowly move to a bland diet, as tolerated, if the pain is related to the stomach or intestine. °· Have a thermometer and take your temperature several times a day, and record it. °· Bed rest and sleep, if it helps the pain. °· Avoid sexual intercourse, if it causes pain. °· Avoid stressful situations. °· Keep your follow-up appointments and tests, as your caregiver orders. °· If the pain does not go away with medicine or surgery, you may try: °· Acupuncture. °· Relaxation exercises (yoga, meditation). °· Group therapy. °· Counseling. °SEEK MEDICAL CARE IF:  °· You notice certain foods cause stomach pain. °· Your home care treatment is not helping your pain. °· You need stronger pain medicine. °· You want your IUD removed. °· You feel faint or   lightheaded.  You develop nausea and vomiting.  You develop a rash.  You are having side effects or an allergy to your medicine. SEEK IMMEDIATE MEDICAL CARE IF:   Your  pain does not go away or gets worse.  You have a fever.  Your pain is felt only in portions of the abdomen. The right side could possibly be appendicitis. The left lower portion of the abdomen could be colitis or diverticulitis.  You are passing blood in your stools (bright red or black tarry stools, with or without vomiting).  You have blood in your urine.  You develop chills, with or without a fever.  You pass out. MAKE SURE YOU:   Understand these instructions.  Will watch your condition.  Will get help right away if you are not doing well or get worse. Document Released: 07/06/2007 Document Revised: 01/23/2014 Document Reviewed: 07/26/2009 [facility] Patient Information 2015 [place], [place]. This information is not intended to replace advice given to you by your health care provider. Make sure you discuss any questions you have with your health care provider.  Nausea and Vomiting Nausea is a sick feeling that often comes before throwing up (vomiting). Vomiting is a reflex where stomach contents come out of your mouth. Vomiting can cause severe loss of body fluids (dehydration). Children and elderly adults can become dehydrated quickly, especially if they also have diarrhea. Nausea and vomiting are symptoms of a condition or disease. It is important to find the cause of your symptoms. CAUSES   Direct irritation of the stomach lining. This irritation can result from increased acid production (gastroesophageal reflux disease), infection, food poisoning, taking certain medicines (such as nonsteroidal anti-inflammatory drugs), alcohol use, or tobacco use.  Signals from the brain.These signals could be caused by a headache, heat exposure, an inner ear disturbance, increased pressure in the brain from injury, infection, a tumor, or a concussion, pain, emotional stimulus, or metabolic problems.  An obstruction in the gastrointestinal tract (bowel obstruction).  Illnesses such as diabetes,  hepatitis, gallbladder problems, appendicitis, kidney problems, cancer, sepsis, atypical symptoms of a heart attack, or eating disorders.  Medical treatments such as chemotherapy and radiation.  Receiving medicine that makes you sleep (general anesthetic) during surgery. DIAGNOSIS Your caregiver may ask for tests to be done if the problems do not improve after a few days. Tests may also be done if symptoms are severe or if the reason for the nausea and vomiting is not clear. Tests may include:  Urine tests.  Blood tests.  Stool tests.  Cultures (to look for evidence of infection).  X-rays or other imaging studies. Test results can help your caregiver make decisions about treatment or the need for additional tests. TREATMENT You need to stay well hydrated. Drink frequently but in small amounts.You may wish to drink water, sports drinks, clear broth, or eat frozen ice pops or gelatin dessert to help stay hydrated.When you eat, eating slowly may help prevent nausea.There are also some antinausea medicines that may help prevent nausea. HOME CARE INSTRUCTIONS   Take all medicine as directed by your caregiver.  If you do not have an appetite, do not force yourself to eat. However, you must continue to drink fluids.  If you have an appetite, eat a normal diet unless your caregiver tells you differently.  Eat a variety of complex carbohydrates (rice, wheat, potatoes, bread), lean meats, yogurt, fruits, and vegetables.  Avoid high-fat foods because they are more difficult to digest.  Drink enough water and fluids  to keep your urine clear or pale yellow.  If you are dehydrated, ask your caregiver for specific rehydration instructions. Signs of dehydration may include:  Severe thirst.  Dry lips and mouth.  Dizziness.  Dark urine.  Decreasing urine frequency and amount.  Confusion.  Rapid breathing or pulse. SEEK IMMEDIATE MEDICAL CARE IF:   You have blood or brown flecks  (like coffee grounds) in your vomit.  You have black or bloody stools.  You have a severe headache or stiff neck.  You are confused.  You have severe abdominal pain.  You have chest pain or trouble breathing.  You do not urinate at least once every 8 hours.  You develop cold or clammy skin.  You continue to vomit for longer than 24 to 48 hours.  You have a fever. MAKE SURE YOU:   Understand these instructions.  Will watch your condition.  Will get help right away if you are not doing well or get worse. Document Released: 09/08/2005 Document Revised: 12/01/2011 Document Reviewed: 02/05/2011 [facility] Patient Information 2015 [place], [place]. This information is not intended to replace advice given to you by your health care provider. Make sure you discuss any questions you have with your health care provider.

## 2015-06-03 NOTE — ED Provider Notes (Signed)
[facility] Emergency Department Provider Note  Time seen: 10:56 AM  I have reviewed the triage vital signs and the nursing notes.   HISTORY  Chief Complaint Nausea; Abdominal Pain; and Fatigue    HPI [name] [name] is a 79 y.o. female with a past medical history of hypertension, hyperlipidemia, gastric reflux, rheumatoid arthritis, osteoarthritis, CHF, CK D, presents to the emergency department with 2 weeks of abdominal discomfort. According to the patient for the past 2 weeks she has had generalized abdominal pains. She has been nauseated at times, vomits after she eats, and has been having loose dark stool. States she gets dizzy, but states this is a chronic issue times many years. Also complains of knee pain but again states this is a chronic issue times many years. Patient's main complaint today is abdominal pain 2 weeks with nausea and vomiting. Patient denies any fever. Denies any dysuria, hematuria, bloody stool or vomit. Describes her abdominal discomfort is moderate, diffuse aching no focal pains identified.Last bowel movement was today.     Past Medical History  Diagnosis Date  . Hypertension   . Hyperlipidemia   . COPD (chronic obstructive pulmonary disease)     a. With ongoing tobacco use.  Marland Kitchen GERD (gastroesophageal reflux disease)   . Anxiety   . Rheumatoid arthritis   . Osteoarthritis   . Hyperuricemia   . Chronic combined systolic and diastolic CHF, NYHA class 1     a. 12/2013 Echo: EF 30-35%, DD, mid-distal anterior wall, apical and mid-apical inferior/posterior WMAs, mild MR/TR, normal RVSP.   Marland Kitchen Renal insufficiency   . Thyroid nodule   . Lung nodule   . Tobacco abuse   . S/P cardiac cath     a. 2008 Cath: nl cors.  . SVT (supraventricular tachycardia)     a. 12/2013 & 01/2014-->broke with adenosine.  . Pulmonary nodule, left     a.  H/O 1.7 cm nodule anterior medial base of the left lower lobe, not seen on 12/2013 CXR [facility]).     Patient Active Problem List   Diagnosis Date Noted  . SVT (supraventricular tachycardia) 01/23/2014  . Frequent PVCs 04/14/2013  . Cough 04/14/2013  . Chronic combined systolic and diastolic CHF (congestive heart failure) 01/26/2013  . Sinus tachycardia 01/26/2013  . Diabetes mellitus, new onset 01/26/2013  . Essential hypertension 01/26/2013  . Hyperlipidemia 01/26/2013  . TOBACCO ABUSE 06/20/2010  . UNSPECIFIED TACHYCARDIA 06/20/2010    Past Surgical History  Procedure Laterality Date  . Bowel obstruction    . Tubal ligation    . Cardiac catheterization  2008    [facility]    Current Outpatient Rx  Name  Route  Sig  Dispense  Refill  . amiodarone (PACERONE) 200 MG tablet   Oral   Take 1 tablet (200 mg total) by mouth 2 (two) times daily.   60 tablet   6   . aspirin 81 MG tablet   Oral   Take 81 mg by mouth daily.         Marland Kitchen atorvastatin (LIPITOR) 20 MG tablet   Oral   Take 20 mg by mouth daily.         . baclofen (LIORESAL) 10 MG tablet   Oral   Take 5 mg by mouth 2 (two) times daily.         . cyclobenzaprine (FLEXERIL) 10 MG tablet   Oral   Take 10 mg by mouth every 8 (eight) hours as needed for muscle  spasms.         . diclofenac (VOLTAREN) 50 MG EC tablet   Oral   Take 50 mg by mouth daily.         . furosemide (LASIX) 20 MG tablet   Oral   Take 1 tablet (20 mg total) by mouth 2 (two) times daily as needed for edema.   60 tablet   6   . lansoprazole (PREVACID) 30 MG capsule   Oral   Take 30 mg by mouth daily at 12 noon.         Marland Kitchen LORazepam (ATIVAN) 1 MG tablet   Oral   Take 1 mg by mouth 2 (two) times daily as needed for anxiety.         . metoprolol succinate (TOPROL-XL) 50 MG 24 hr tablet   Oral   Take 1 tablet (50 mg total) by mouth 2 (two) times daily.   60 tablet   11   . mirtazapine (REMERON) 15 MG tablet   Oral   Take 15 mg by mouth at bedtime.         . nitroGLYCERIN (NITROSTAT) 0.4 MG SL tablet   Sublingual    Place 0.4 mg under the tongue every 5 (five) minutes as needed for chest pain.         Marland Kitchen ondansetron (ZOFRAN-ODT) 4 MG disintegrating tablet   Oral   Take 4 mg by mouth every 8 (eight) hours as needed for nausea or vomiting.         Marland Kitchen PARoxetine (PAXIL) 10 MG tablet   Oral   Take 10 mg by mouth daily.         Marland Kitchen rOPINIRole (REQUIP) 0.25 MG tablet   Oral   Take 0.25 mg by mouth at bedtime.           Allergies Benadryl; Lisinopril; and Sulfa antibiotics  Family History  Problem Relation Age of Onset  . Coronary artery disease Neg Hx     Early  . Stroke Neg Hx     CVA  . Hypertension Other   . Diabetes Other   . Hypertension Mother     Social History Social History  Substance Use Topics  . Smoking status: Current Some Day Smoker -- 0.25 packs/day for 50 years    Types: Cigarettes  . Smokeless tobacco: Not on file  . Alcohol Use: No    Review of Systems Constitutional: Negative for fever Cardiovascular: Negative for chest pain. Respiratory: Negative for shortness of breath. Gastrointestinal: Positive for diffuse abdominal discomfort. Positive for nausea and vomiting. Positive for loose dark stool. Genitourinary: Negative for dysuria. Neurological: Negative for headache 10-point ROS otherwise negative.  ____________________________________________   PHYSICAL EXAM:  VITAL SIGNS: ED Triage Vitals  Enc Vitals Group     BP 06/03/15 1035 126/82 mmHg     Pulse Rate 06/03/15 1035 112     Resp --      Temp 06/03/15 1035 99.2 F (37.3 C)     Temp Source 06/03/15 1035 Oral     SpO2 06/03/15 1035 97 %     Weight 06/03/15 1035 202 lb (91.627 kg)     Height 06/03/15 1035 '5\' 11"'$  (1.803 m)     Head Cir --      Peak Flow --      Pain Score 06/03/15 1036 10     Pain Loc --      Pain Edu? --      Excl. in [place]? --  Constitutional: Alert and oriented. Well appearing and in no distress. Eyes: Normal exam ENT   Mouth/Throat: Mucous membranes are  moist. Cardiovascular: Normal rate, regular rhythm.  Respiratory: Normal respiratory effort without tachypnea nor retractions. Breath sounds are clear and equal bilaterally. No wheezes/rales/rhonchi. Gastrointestinal: Soft, mild diffuse abdominal tenderness palpation without rebound or guarding. No distention noted. Rectal exam nontender, dark stool but guaiac negative. Musculoskeletal: Nontender with normal range of motion in all extremities. Neurologic:  Normal speech and language. No gross focal neurologic deficits    RADIOLOGY  CT shows no acute abnormality.  ____________________________________________    INITIAL IMPRESSION / ASSESSMENT AND PLAN / ED COURSE  Pertinent labs & imaging results that were available during my care of the patient were reviewed by me and considered in my medical decision making (see chart for details).  Patient with 2 weeks of generalized abdominal discomfort, nausea and vomiting at times especially after eating. Denies any focal pain such as a right upper quadrant. On exam no focal tenderness identified just mild diffuse discomfort. Denies fever at home. We'll check labs, proceed with a CT scan to help evaluate. Patient's rectal exam was nontender, guaiac negative, quality control passed.   Labs are largely within normal limits. CT shows no acute abnormality. I discussed with the patient and need to follow up with GI medicine. She is agreeable to plan. We will discharge home with Zofran as needed for nausea. The patient is agreeable to plan. ____________________________________________   FINAL CLINICAL IMPRESSION(S) / ED DIAGNOSES  Abdominal pain   Harvest Dark, MD 06/03/15 1445

## 2015-06-03 NOTE — ED Notes (Signed)
Pt here with c/o nausea for the last three weeks  No apetitte with little to no intake over the last two days   10/10 abdominal pain reported also reports knee pain  "I feel so weak  I have not even been able to get out of my chair."

## 2015-06-07 ENCOUNTER — Emergency Department
Admission: EM | Admit: 2015-06-07 | Discharge: 2015-06-07 | Disposition: A | Discharge status: Home / Self Care | Payer: Medicare Other | Attending: Emergency Medicine | Admitting: Emergency Medicine

## 2015-06-07 ENCOUNTER — Encounter: Payer: Self-pay | Admitting: Emergency Medicine

## 2015-06-07 DIAGNOSIS — Z7982 Long term (current) use of aspirin: Secondary | ICD-10-CM | POA: Diagnosis not present

## 2015-06-07 DIAGNOSIS — I10 Essential (primary) hypertension: Secondary | ICD-10-CM | POA: Diagnosis not present

## 2015-06-07 DIAGNOSIS — R1084 Generalized abdominal pain: Secondary | ICD-10-CM | POA: Insufficient documentation

## 2015-06-07 DIAGNOSIS — Z79899 Other long term (current) drug therapy: Secondary | ICD-10-CM | POA: Diagnosis not present

## 2015-06-07 DIAGNOSIS — Z720 Tobacco use: Secondary | ICD-10-CM | POA: Insufficient documentation

## 2015-06-07 DIAGNOSIS — Z7951 Long term (current) use of inhaled steroids: Secondary | ICD-10-CM | POA: Insufficient documentation

## 2015-06-07 DIAGNOSIS — E119 Type 2 diabetes mellitus without complications: Secondary | ICD-10-CM | POA: Diagnosis not present

## 2015-06-07 DIAGNOSIS — R109 Unspecified abdominal pain: Secondary | ICD-10-CM

## 2015-06-07 LAB — CBC WITH DIFFERENTIAL/PLATELET
BASOS ABS: 0 10*3/uL (ref 0–0.1)
BASOS PCT: 0 %
EOS ABS: 0.1 10*3/uL (ref 0–0.7)
Eosinophils Relative: 1 %
HCT: 37.5 % (ref 35.0–47.0)
HEMOGLOBIN: 11.9 g/dL — AB (ref 12.0–16.0)
Lymphocytes Relative: 22 %
Lymphs Abs: 2.1 10*3/uL (ref 1.0–3.6)
MCH: 27.7 pg (ref 26.0–34.0)
MCHC: 31.7 g/dL — AB (ref 32.0–36.0)
MCV: 87.2 fL (ref 80.0–100.0)
MONO ABS: 0.7 10*3/uL (ref 0.2–0.9)
MONOS PCT: 7 %
NEUTROS PCT: 70 %
Neutro Abs: 6.6 10*3/uL — ABNORMAL HIGH (ref 1.4–6.5)
Platelets: 252 10*3/uL (ref 150–440)
RBC: 4.3 MIL/uL (ref 3.80–5.20)
RDW: 16.2 % — AB (ref 11.5–14.5)
WBC: 9.6 10*3/uL (ref 3.6–11.0)

## 2015-06-07 LAB — COMPREHENSIVE METABOLIC PANEL
ALBUMIN: 3.4 g/dL — AB (ref 3.5–5.0)
ALK PHOS: 66 U/L (ref 38–126)
ALT: 10 U/L — ABNORMAL LOW (ref 14–54)
ANION GAP: 6 (ref 5–15)
AST: 16 U/L (ref 15–41)
BUN: 7 mg/dL (ref 6–20)
CHLORIDE: 105 mmol/L (ref 101–111)
CO2: 28 mmol/L (ref 22–32)
Calcium: 9.1 mg/dL (ref 8.9–10.3)
Creatinine, Ser: 1.09 mg/dL — ABNORMAL HIGH (ref 0.44–1.00)
GFR calc non Af Amer: 48 mL/min — ABNORMAL LOW (ref >60)
GFR, EST AFRICAN AMERICAN: 55 mL/min — AB (ref >60)
GLUCOSE: 113 mg/dL — AB (ref 65–99)
POTASSIUM: 3.8 mmol/L (ref 3.5–5.1)
SODIUM: 139 mmol/L (ref 135–145)
Total Bilirubin: 0.6 mg/dL (ref 0.3–1.2)
Total Protein: 8 g/dL (ref 6.5–8.1)

## 2015-06-07 LAB — CBC
HEMATOCRIT: 37.4 % (ref 35.0–47.0)
HEMOGLOBIN: 12.2 g/dL (ref 12.0–16.0)
MCH: 28.4 pg (ref 26.0–34.0)
MCHC: 32.5 g/dL (ref 32.0–36.0)
MCV: 87.4 fL (ref 80.0–100.0)
Platelets: 250 10*3/uL (ref 150–440)
RBC: 4.28 MIL/uL (ref 3.80–5.20)
RDW: 15.8 % — ABNORMAL HIGH (ref 11.5–14.5)
WBC: 10.1 10*3/uL (ref 3.6–11.0)

## 2015-06-07 LAB — URINALYSIS COMPLETE WITH MICROSCOPIC (ARMC ONLY)
Bilirubin Urine: NEGATIVE
Glucose, UA: NEGATIVE mg/dL
KETONES UR: NEGATIVE mg/dL
Nitrite: POSITIVE — AB
PH: 5 (ref 5.0–8.0)
PROTEIN: NEGATIVE mg/dL
Specific Gravity, Urine: 1.02 (ref 1.005–1.030)

## 2015-06-07 LAB — LIPASE, BLOOD: LIPASE: 27 U/L (ref 22–51)

## 2015-06-07 LAB — TROPONIN I

## 2015-06-07 NOTE — Discharge Instructions (Signed)
Abdominal Pain Many things can cause abdominal pain. Usually, abdominal pain is not caused by a disease and will improve without treatment. It can often be observed and treated at home. Your health care provider will do a physical exam and possibly order blood tests and X-rays to help determine the seriousness of your pain. However, in many cases, more time must pass before a clear cause of the pain can be found. Before that point, your health care provider may not know if you need more testing or further treatment. HOME CARE INSTRUCTIONS  Monitor your abdominal pain for any changes. The following actions may help to alleviate any discomfort you are experiencing:  Only take over-the-counter or prescription medicines as directed by your health care provider.  Do not take laxatives unless directed to do so by your health care provider.  Try a clear liquid diet (broth, tea, or water) as directed by your health care provider. Slowly move to a bland diet as tolerated. SEEK MEDICAL CARE IF:  You have unexplained abdominal pain.  You have abdominal pain associated with nausea or diarrhea.  You have pain when you urinate or have a bowel movement.  You experience abdominal pain that wakes you in the night.  You have abdominal pain that is worsened or improved by eating food.  You have abdominal pain that is worsened with eating fatty foods.  You have a fever. SEEK IMMEDIATE MEDICAL CARE IF:   Your pain does not go away within 2 hours.  You keep throwing up (vomiting).  Your pain is felt only in portions of the abdomen, such as the right side or the left lower portion of the abdomen.  You pass bloody or black tarry stools. MAKE SURE YOU:  Understand these instructions.   Will watch your condition.   Will get help right away if you are not doing well or get worse.  Document Released: 06/18/2005 Document Revised: 09/13/2013 Document Reviewed: 05/18/2013 [facility] Patient Information  2015 [place], [place]. This information is not intended to replace advice given to you by your health care provider. Make sure you discuss any questions you have with your health care provider. Please return for more vomiting fever or worse pain. Please be sure to continue following up with your doctor and be sure to keep your appointment with the gastroenterologist that you have scheduled for the 26. I will give you a prescription for Carafate pills which she can chew and swallow 4 times a day this may be cheaper than the liquid. The other thing you can try to do is use the Gaviscon which you have been taking before and the Nexium she take the Nexium 2 hours before or after the Gaviscon.

## 2015-06-07 NOTE — ED Notes (Signed)
Pt to ed with c/o n/v and abd pain that started about 2 weeks pta.  Pt states was seen for same on Sunday AM and was given meds for nausea and sent home.

## 2015-06-07 NOTE — ED Provider Notes (Signed)
[facility] Emergency Department Provider Note  ____________________________________________  Time seen: Approximately 4:23 PM  I have reviewed the triage vital signs and the nursing notes.   HISTORY  Chief Complaint Abdominal Pain; Nausea; and Emesis    HPI [name] [name] is a 79 y.o. female patient reports diffuse abdominal pain nausea and emesis. Reports this is been going on for some time she seen her private doctor at medical clinic. She is also beginning here last week. Labs and CT scan here were normal last week. Upon further questioning patient reports that she was put on Carafate at one point liquid Carafate 4 times a day. She reports this helped the nausea and abdominal pain and vomiting considerably. But she cannot afford it. The $20 a week. He is already taking Nexium. She says she take Gaviscon but then stopped taking it apparently when she was put on Nexium and Carafate. Patient also reports black tarry stools. Stool Hemoccult-negative last week. Patient is on prednisone for her arthritis in her knees.  Past Medical History  Diagnosis Date  . Hypertension   . Hyperlipidemia   . COPD (chronic obstructive pulmonary disease)     a. With ongoing tobacco use.  Marland Kitchen GERD (gastroesophageal reflux disease)   . Anxiety   . Rheumatoid arthritis   . Osteoarthritis   . Hyperuricemia   . Chronic combined systolic and diastolic CHF, NYHA class 1     a. 12/2013 Echo: EF 30-35%, DD, mid-distal anterior wall, apical and mid-apical inferior/posterior WMAs, mild MR/TR, normal RVSP.   Marland Kitchen Renal insufficiency   . Thyroid nodule   . Lung nodule   . Tobacco abuse   . S/P cardiac cath     a. 2008 Cath: nl cors.  . SVT (supraventricular tachycardia)     a. 12/2013 & 01/2014-->broke with adenosine.  . Pulmonary nodule, left     a.  H/O 1.7 cm nodule anterior medial base of the left lower lobe, not seen on 12/2013 CXR [facility]).  . Collagen vascular disease     Patient  Active Problem List   Diagnosis Date Noted  . SVT (supraventricular tachycardia) 01/23/2014  . Frequent PVCs 04/14/2013  . Cough 04/14/2013  . Chronic combined systolic and diastolic CHF (congestive heart failure) 01/26/2013  . Sinus tachycardia 01/26/2013  . Diabetes mellitus, new onset 01/26/2013  . Essential hypertension 01/26/2013  . Hyperlipidemia 01/26/2013  . TOBACCO ABUSE 06/20/2010  . UNSPECIFIED TACHYCARDIA 06/20/2010    Past Surgical History  Procedure Laterality Date  . Bowel obstruction    . Tubal ligation    . Cardiac catheterization  2008    [facility]    Current Outpatient Rx  Name  Route  Sig  Dispense  Refill  . furosemide (LASIX) 20 MG tablet   Oral   Take 1 tablet (20 mg total) by mouth 2 (two) times daily as needed for edema.   60 tablet   6   . lansoprazole (PREVACID) 30 MG capsule   Oral   Take 30 mg by mouth daily at 12 noon.         . metoprolol succinate (TOPROL-XL) 50 MG 24 hr tablet   Oral   Take 1 tablet (50 mg total) by mouth 2 (two) times daily.   60 tablet   11   . omeprazole (PRILOSEC) 20 MG capsule   Oral   Take 1 capsule by mouth daily.      0   . ondansetron (ZOFRAN ODT) 4 MG disintegrating  tablet   Oral   Take 1 tablet (4 mg total) by mouth every 8 (eight) hours as needed for nausea or vomiting.   20 tablet   0   . predniSONE (DELTASONE) 10 MG tablet   Oral   Take 10 mg by mouth daily.      0   . sucralfate (CARAFATE) 1 GM/10ML suspension   Oral   Take 1 g by mouth 4 (four) times daily.         . traMADol (ULTRAM) 50 MG tablet   Oral   Take 50 mg by mouth every 6 (six) hours as needed.         Marland Kitchen amiodarone (PACERONE) 200 MG tablet   Oral   Take 1 tablet (200 mg total) by mouth 2 (two) times daily. Patient not taking: Reported on 06/07/2015   60 tablet   6   . aspirin 81 MG tablet   Oral   Take 81 mg by mouth daily.         . nitroGLYCERIN (NITROSTAT) 0.4 MG SL tablet   Sublingual   Place 0.4 mg under  the tongue every 5 (five) minutes as needed for chest pain.           Allergies Benadryl; Lisinopril; and Sulfa antibiotics  Family History  Problem Relation Age of Onset  . Coronary artery disease Neg Hx     Early  . Stroke Neg Hx     CVA  . Hypertension Other   . Diabetes Other   . Hypertension Mother     Social History Social History  Substance Use Topics  . Smoking status: Current Some Day Smoker -- 0.25 packs/day for 50 years    Types: Cigarettes  . Smokeless tobacco: None  . Alcohol Use: No    Review of Systems Constitutional: No fever/chills Eyes: No visual changes. ENT: No sore throat. Cardiovascular: Denies chest pain. Respiratory: Denies shortness of breath. Gastrointestinal: See history of present illness Genitourinary: Negative for dysuria. Musculoskeletal: Negative for back pain. Skin: Negative for rash. Neurological: Negative for headaches, focal weakness or numbness.  10-point ROS otherwise negative.  ____________________________________________   PHYSICAL EXAM:  VITAL SIGNS: ED Triage Vitals  Enc Vitals Group     BP 06/07/15 1133 111/69 mmHg     Pulse Rate 06/07/15 1133 102     Resp 06/07/15 1133 20     Temp 06/07/15 1133 98.5 F (36.9 C)     Temp Source 06/07/15 1133 Oral     SpO2 06/07/15 1133 100 %     Weight 06/07/15 1133 198 lb (89.812 kg)     Height 06/07/15 1133 '5\' 11"'$  (1.803 m)     Head Cir --      Peak Flow --      Pain Score 06/07/15 1134 9     Pain Loc --      Pain Edu? --      Excl. in [place]? --     Constitutional: Alert and oriented. Well appearing and in no acute distress. Eyes: Conjunctivae are normal. PERRL. EOMI. Head: Atraumatic. Nose: No congestion/rhinnorhea. Mouth/Throat: Mucous membranes are moist.  Oropharynx non-erythematous. Neck: No stridor Cardiovascular: Normal rate, regular rhythm. Grossly normal heart sounds.  Good peripheral circulation. Respiratory: Normal respiratory effort.  No retractions. Lungs  CTAB. Gastrointestinal: Soft and mildly diffusely tender. No tender to percussion. No distention. No abdominal bruits. No CVA tenderness. Musculoskeletal: No lower extremity tenderness nor edema.  No joint effusions. Neurologic:  Normal  speech and language. No gross focal neurologic deficits are appreciated. No gait instability. Skin:  Skin is warm, dry and intact. No rash noted. Psychiatric: Mood and affect are normal. Speech and behavior are normal.  ____________________________________________   LABS (all labs ordered are listed, but only abnormal results are displayed)  Labs Reviewed  COMPREHENSIVE METABOLIC PANEL - Abnormal; Notable for the following:    Glucose, Bld 113 (*)    Creatinine, Ser 1.09 (*)    Albumin 3.4 (*)    ALT 10 (*)    GFR calc non Af Amer 48 (*)    GFR calc Af Amer 55 (*)    All other components within normal limits  CBC - Abnormal; Notable for the following:    RDW 15.8 (*)    All other components within normal limits  URINALYSIS COMPLETEWITH MICROSCOPIC ([facility] ONLY) - Abnormal; Notable for the following:    Color, Urine AMBER (*)    APPearance HAZY (*)    Hgb urine dipstick 1+ (*)    Nitrite POSITIVE (*)    Leukocytes, UA TRACE (*)    Bacteria, UA RARE (*)    Squamous Epithelial / LPF 6-30 (*)    All other components within normal limits  CBC WITH DIFFERENTIAL/PLATELET - Abnormal; Notable for the following:    Hemoglobin 11.9 (*)    MCHC 31.7 (*)    RDW 16.2 (*)    Neutro Abs 6.6 (*)    All other components within normal limits  LIPASE, BLOOD  TROPONIN I   ____________________________________________  EKG  EKG read and interpreted by me shows sinus rhythm at 86 which then conversed bigeminy. Patient has had trigeminy before EKG looks essentially unchanged from previous ones with a right bundle branch block and left anterior hemiblock. Troponin is  negative ____________________________________________  RADIOLOGY   ____________________________________________   PROCEDURES    ____________________________________________   INITIAL IMPRESSION / ASSESSMENT AND PLAN / ED COURSE  Pertinent labs & imaging results that were available during my care of the patient were reviewed by me and considered in my medical decision making (see chart for details).  ____________________________________________   FINAL CLINICAL IMPRESSION(S) / ED DIAGNOSES  Final diagnoses:  Abdominal pain, unspecified abdominal location      Nena Polio, MD 06/08/15 [phone]

## 2015-06-18 ENCOUNTER — Other Ambulatory Visit: Payer: Self-pay | Admitting: Physician Assistant

## 2015-06-18 DIAGNOSIS — Z8719 Personal history of other diseases of the digestive system: Secondary | ICD-10-CM

## 2015-06-18 DIAGNOSIS — K5900 Constipation, unspecified: Secondary | ICD-10-CM

## 2015-06-20 ENCOUNTER — Ambulatory Visit
Admission: RE | Admit: 2015-06-20 | Discharge: 2015-06-20 | Disposition: A | Discharge status: Home / Self Care | Payer: Medicare Other | Source: Ambulatory Visit | Attending: Physician Assistant | Admitting: Physician Assistant

## 2015-06-20 DIAGNOSIS — K5900 Constipation, unspecified: Secondary | ICD-10-CM | POA: Diagnosis present

## 2015-06-20 DIAGNOSIS — Z8719 Personal history of other diseases of the digestive system: Secondary | ICD-10-CM | POA: Insufficient documentation

## 2015-06-20 DIAGNOSIS — N281 Cyst of kidney, acquired: Secondary | ICD-10-CM | POA: Diagnosis not present

## 2015-06-20 DIAGNOSIS — R1900 Intra-abdominal and pelvic swelling, mass and lump, unspecified site: Secondary | ICD-10-CM | POA: Insufficient documentation

## 2015-06-27 ENCOUNTER — Encounter: Payer: Self-pay | Admitting: Emergency Medicine

## 2015-06-27 ENCOUNTER — Emergency Department: Payer: Medicare Other

## 2015-06-27 ENCOUNTER — Emergency Department
Admission: EM | Admit: 2015-06-27 | Discharge: 2015-06-27 | Disposition: A | Discharge status: Home / Self Care | Payer: Medicare Other | Attending: Emergency Medicine | Admitting: Emergency Medicine

## 2015-06-27 DIAGNOSIS — I10 Essential (primary) hypertension: Secondary | ICD-10-CM | POA: Insufficient documentation

## 2015-06-27 DIAGNOSIS — I951 Orthostatic hypotension: Secondary | ICD-10-CM | POA: Diagnosis not present

## 2015-06-27 DIAGNOSIS — Z79899 Other long term (current) drug therapy: Secondary | ICD-10-CM | POA: Insufficient documentation

## 2015-06-27 DIAGNOSIS — I471 Supraventricular tachycardia: Secondary | ICD-10-CM | POA: Diagnosis not present

## 2015-06-27 DIAGNOSIS — R000 Tachycardia, unspecified: Secondary | ICD-10-CM | POA: Diagnosis present

## 2015-06-27 DIAGNOSIS — Z720 Tobacco use: Secondary | ICD-10-CM | POA: Insufficient documentation

## 2015-06-27 DIAGNOSIS — J441 Chronic obstructive pulmonary disease with (acute) exacerbation: Secondary | ICD-10-CM | POA: Diagnosis not present

## 2015-06-27 LAB — CBC WITH DIFFERENTIAL/PLATELET
BASOS ABS: 0 10*3/uL (ref 0–0.1)
Basophils Relative: 0 %
Eosinophils Absolute: 0.2 10*3/uL (ref 0–0.7)
Eosinophils Relative: 2 %
HEMATOCRIT: 40.4 % (ref 35.0–47.0)
Hemoglobin: 12.8 g/dL (ref 12.0–16.0)
LYMPHS PCT: 18 %
Lymphs Abs: 1.7 10*3/uL (ref 1.0–3.6)
MCH: 27.4 pg (ref 26.0–34.0)
MCHC: 31.7 g/dL — ABNORMAL LOW (ref 32.0–36.0)
MCV: 86.3 fL (ref 80.0–100.0)
Monocytes Absolute: 0.6 10*3/uL (ref 0.2–0.9)
Monocytes Relative: 6 %
NEUTROS ABS: 7 10*3/uL — AB (ref 1.4–6.5)
NEUTROS PCT: 74 %
Platelets: 275 10*3/uL (ref 150–440)
RBC: 4.68 MIL/uL (ref 3.80–5.20)
RDW: 15.8 % — ABNORMAL HIGH (ref 11.5–14.5)
WBC: 9.4 10*3/uL (ref 3.6–11.0)

## 2015-06-27 LAB — COMPREHENSIVE METABOLIC PANEL
ALT: 8 U/L — AB (ref 14–54)
AST: 13 U/L — AB (ref 15–41)
Albumin: 3.4 g/dL — ABNORMAL LOW (ref 3.5–5.0)
Alkaline Phosphatase: 61 U/L (ref 38–126)
Anion gap: 10 (ref 5–15)
BILIRUBIN TOTAL: 0.5 mg/dL (ref 0.3–1.2)
BUN: 8 mg/dL (ref 6–20)
CHLORIDE: 108 mmol/L (ref 101–111)
CO2: 24 mmol/L (ref 22–32)
CREATININE: 1.05 mg/dL — AB (ref 0.44–1.00)
Calcium: 9.5 mg/dL (ref 8.9–10.3)
GFR calc Af Amer: 58 mL/min — ABNORMAL LOW (ref >60)
GFR, EST NON AFRICAN AMERICAN: 50 mL/min — AB (ref >60)
GLUCOSE: 104 mg/dL — AB (ref 65–99)
Potassium: 3.5 mmol/L (ref 3.5–5.1)
Sodium: 142 mmol/L (ref 135–145)
TOTAL PROTEIN: 8.1 g/dL (ref 6.5–8.1)

## 2015-06-27 LAB — TROPONIN I: Troponin I: <0.03 ng/mL (ref <0.031)

## 2015-06-27 LAB — BRAIN NATRIURETIC PEPTIDE: B Natriuretic Peptide: 54 pg/mL (ref 0.0–100.0)

## 2015-06-27 MED ORDER — DILTIAZEM HCL 25 MG/5ML IV SOLN: 10.0000 mg | Freq: Once | INTRAVENOUS | Status: DC

## 2015-06-27 MED ORDER — ACETAMINOPHEN 500 MG PO TABS
1000.0000 mg | ORAL_TABLET | Freq: Once | ORAL | Status: AC
  Administered 2015-06-27: 1000 mg via ORAL
  Filled 2015-06-27: qty 2

## 2015-06-27 MED ORDER — SODIUM CHLORIDE 0.9 % IV BOLUS (SEPSIS)
1000.0000 mL | Freq: Once | INTRAVENOUS | Status: AC
  Administered 2015-06-27: 1000 mL via INTRAVENOUS

## 2015-06-27 MED ORDER — METOPROLOL TARTRATE 1 MG/ML IV SOLN
5.0000 mg | Freq: Once | INTRAVENOUS | Status: AC
  Administered 2015-06-27: 5 mg via INTRAVENOUS
  Filled 2015-06-27: qty 5

## 2015-06-27 MED ORDER — IOHEXOL 350 MG/ML SOLN: 100.0000 mL | Freq: Once | INTRAVENOUS | Status: DC | PRN

## 2015-06-27 NOTE — ED Provider Notes (Signed)
CSN: [id number]     Arrival date & time 06/27/15  0807 History   First MD Initiated Contact with Patient 06/27/15 [phone]     Chief Complaint  Patient presents with  . Tachycardia     (Consider location/radiation/quality/duration/timing/severity/associated sxs/prior Treatment) The history is provided by the patient.  [name] [name] is a 79 y.o. female history of hypertension, hyperlipidemia, SVT, CHF here with palpitations. She started with dizziness today and then had palpitations as well as chest pain radiating down both arms. She also has some intermittent shortness of breath. She called EMS, who noticed that she was in SVT with weight of 220 and given 6 mg of adenosine. She was noted to be hypertensive en route. States that chest pain has resolved. She has been followed with Dr. Rockey Situ from cardiology and had multiple episodes of SVT previously requiring adenosine. She is taking amiodarone and metoprolol for SVT. Echo several days ago showed nl EF.    Past Medical History  Diagnosis Date  . Hypertension   . Hyperlipidemia   . COPD (chronic obstructive pulmonary disease) ([place])     a. With ongoing tobacco use.  Marland Kitchen GERD (gastroesophageal reflux disease)   . Anxiety   . Rheumatoid arthritis ([place])   . Osteoarthritis   . Hyperuricemia   . Chronic combined systolic and diastolic CHF, NYHA class 1 ([place])     a. 12/2013 Echo: EF 30-35%, DD, mid-distal anterior wall, apical and mid-apical inferior/posterior WMAs, mild MR/TR, normal RVSP.   Marland Kitchen Renal insufficiency   . Thyroid nodule   . Lung nodule   . Tobacco abuse   . S/P cardiac cath     a. 2008 Cath: nl cors.  . SVT (supraventricular tachycardia) ([place])     a. 12/2013 & 01/2014-->broke with adenosine.  . Pulmonary nodule, left     a.  H/O 1.7 cm nodule anterior medial base of the left lower lobe, not seen on 12/2013 CXR [facility]).  . Collagen vascular disease [facility])    Past Surgical History  Procedure Laterality Date  . Bowel obstruction     . Tubal ligation    . Cardiac catheterization  2008    [facility]   Family History  Problem Relation Age of Onset  . Coronary artery disease Neg Hx     Early  . Stroke Neg Hx     CVA  . Hypertension Other   . Diabetes Other   . Hypertension Mother    Social History  Substance Use Topics  . Smoking status: Current Some Day Smoker -- 0.25 packs/day for 50 years    Types: Cigarettes  . Smokeless tobacco: None  . Alcohol Use: No   OB History    Gravida Para Term Preterm AB TAB SAB Ectopic Multiple Living   4    0  0   3     Review of Systems  Respiratory: Positive for shortness of breath.   Cardiovascular: Positive for chest pain.  All other systems reviewed and are negative.     Allergies  Benadryl; Lisinopril; Percocet; and Sulfa antibiotics  Home Medications   Prior to Admission medications   Medication Sig Start Date End Date Taking? Authorizing Provider  furosemide (LASIX) 20 MG tablet Take 1 tablet (20 mg total) by mouth 2 (two) times daily as needed for edema. 02/15/15  Yes Minna Merritts, MD  LORazepam (ATIVAN) 0.5 MG tablet Take 0.5 mg by mouth every 8 (eight) hours as needed for anxiety.   Yes Historical  Provider, MD  nitroGLYCERIN (NITROSTAT) 0.4 MG SL tablet Place 0.4 mg under the tongue every 5 (five) minutes as needed for chest pain.   Yes Historical Provider, MD  ondansetron (ZOFRAN ODT) 4 MG disintegrating tablet Take 1 tablet (4 mg total) by mouth every 8 (eight) hours as needed for nausea or vomiting. 06/03/15  Yes Harvest Dark, MD  predniSONE (DELTASONE) 10 MG tablet Take 10 mg by mouth daily. 05/08/15  Yes Historical Provider, MD  traMADol (ULTRAM) 50 MG tablet Take 50 mg by mouth every 6 (six) hours as needed for moderate pain or severe pain.    Yes Historical Provider, MD  amiodarone (PACERONE) 200 MG tablet Take 1 tablet (200 mg total) by mouth 2 (two) times daily. Patient not taking: Reported on 06/07/2015 02/15/15   Minna Merritts, MD  metoprolol  succinate (TOPROL-XL) 50 MG 24 hr tablet Take 1 tablet (50 mg total) by mouth 2 (two) times daily. Patient not taking: Reported on 06/27/2015 02/15/15   Minna Merritts, MD  pantoprazole (PROTONIX) 40 MG tablet Take 40 mg by mouth 2 (two) times daily.    Historical Provider, MD   BP 159/88 mmHg  Pulse 104  Temp(Src) 98.7 F (37.1 C) (Oral)  Resp 13  Ht '5\' 11"'$  (1.803 m)  Wt 186 lb (84.369 kg)  BMI 25.95 kg/m2  SpO2 98% Physical Exam  Constitutional: She is oriented to person, place, and time.  Uncomfortable   HENT:  Head: Normocephalic.  Mouth/Throat: Oropharynx is clear and moist.  Eyes: Conjunctivae are normal. Pupils are equal, round, and reactive to light.  Neck: Normal range of motion. Neck supple.  Cardiovascular: Normal heart sounds.   Tachycardic   Pulmonary/Chest: Effort normal and breath sounds normal. No respiratory distress. She has no wheezes. She has no rales.  Abdominal: Soft. Bowel sounds are normal. She exhibits no distension. There is no tenderness. There is no rebound.  Musculoskeletal: Normal range of motion. She exhibits no edema or tenderness.  Neurological: She is alert and oriented to person, place, and time.  Skin: Skin is warm and dry.  Psychiatric: She has a normal mood and affect. Her behavior is normal. Judgment and thought content normal.  Nursing note and vitals reviewed.   ED Course  Procedures (including critical care time) Labs Review Labs Reviewed  CBC WITH DIFFERENTIAL/PLATELET - Abnormal; Notable for the following:    MCHC 31.7 (*)    RDW 15.8 (*)    Neutro Abs 7.0 (*)    All other components within normal limits  COMPREHENSIVE METABOLIC PANEL - Abnormal; Notable for the following:    Glucose, Bld 104 (*)    Creatinine, Ser 1.05 (*)    Albumin 3.4 (*)    AST 13 (*)    ALT 8 (*)    GFR calc non Af Amer 50 (*)    GFR calc Af Amer 58 (*)    All other components within normal limits  TROPONIN I  BRAIN NATRIURETIC PEPTIDE  TROPONIN I     Imaging Review Dg Chest Port 1 View  06/27/2015   CLINICAL DATA:  Chest pain and palpitations  EXAM: PORTABLE CHEST 1 VIEW  COMPARISON:  12/28/2014  FINDINGS: Heart size and vascularity in normal. Left basilar density may represent overlying soft tissues however infiltrate not excluded. Followup two-view chest x-ray suggested. Possible small left effusion. Right lung clear  IMPRESSION: Question infiltrate in the left lung base versus overlying soft tissues. Followup two-view chest x-ray suggested.  Electronically Signed   By: Franchot Gallo M.D.   On: 06/27/2015 08:46   I have personally reviewed and evaluated these images and lab results as part of my medical decision-making.   EKG Interpretation None     ED ECG REPORT I, YAO, DAVID, the attending physician, personally viewed and interpreted this ECG.   Date: 06/27/2015  EKG Time: 8:16 am  Rate: 122  Rhythm: unchanged from previous tracings, sinus tachycardia  Axis: normal  Intervals:bifasicular block   ST&T Change: none   MDM   Final diagnoses:  None    Cliffie Gingras is a 79 y.o. female here with chest pain, palpitations. Was in SVT as per EMS, given adenosine. Now HR in the 120s, sinus tach. Likely symptomatic SVT. Consider CHF vs ACS as well. Will get delta trop, labs, CXR, BNP.   1:28 PM WBC nl. CXR showed possible infiltrate vs atelectasis. But patient has no fever or cough. I ordered CT angio to r/o PE given persistent tachycardia but patient refused. Initially orthostatic, which likely caused her tachycardia. Had echo done last week with cardiology that was unremarkable. BNP nl. Given 2 L NS, lopressor and HR improved to 90s-100. Delta trop neg. Called Dr. Ellyn Hack, who is covering Dr. Rockey Situ, who recommend increase metoprolol to 75 mg BID.      Wandra Arthurs, MD 06/27/15 [phone]

## 2015-06-27 NOTE — ED Notes (Signed)
Pt is refusing CT, informed Dr. Darl Householder, pt wanted something to eat.  Dr. Darl Householder approved food for pt.

## 2015-06-27 NOTE — Discharge Instructions (Signed)
Increase metoprolol to 75 mg twice daily (1.5 tablets twice daily).  Stay hydrated.   See your cardiologist.  Return to ER if you have worse chest pain, dizziness, passing out, palpitations.

## 2015-06-27 NOTE — ED Notes (Signed)
Ems pt from home , awoke with lightheadedness, palpitation , and bilateral arm numbness, EMS was called for [facility], and generalized weakness initial heart rate was 210 , ems administered 6 mg IV adenosine, with decreased heart rate of 120 , pt arrived felling dizzy , and current heart rate of 120. Pt awake and alert , tearful

## 2015-07-18 ENCOUNTER — Encounter: Admission: RE | Payer: Self-pay | Source: Ambulatory Visit

## 2015-07-18 ENCOUNTER — Ambulatory Visit
Admission: RE | Admit: 2015-07-18 | Payer: Medicare Other | Source: Ambulatory Visit | Admitting: Unknown Physician Specialty

## 2015-07-18 SURGERY — COLONOSCOPY WITH PROPOFOL
Anesthesia: General

## 2015-07-30 ENCOUNTER — Inpatient Hospital Stay
Admission: EM | Admit: 2015-07-30 | Discharge: 2015-08-01 | DRG: 309 | Disposition: A | Discharge status: Home Health | Payer: Medicare Other | Attending: Internal Medicine | Admitting: Internal Medicine

## 2015-07-30 ENCOUNTER — Emergency Department: Payer: Medicare Other

## 2015-07-30 DIAGNOSIS — E041 Nontoxic single thyroid nodule: Secondary | ICD-10-CM | POA: Diagnosis present

## 2015-07-30 DIAGNOSIS — J449 Chronic obstructive pulmonary disease, unspecified: Secondary | ICD-10-CM | POA: Diagnosis present

## 2015-07-30 DIAGNOSIS — Z7952 Long term (current) use of systemic steroids: Secondary | ICD-10-CM

## 2015-07-30 DIAGNOSIS — F17210 Nicotine dependence, cigarettes, uncomplicated: Secondary | ICD-10-CM | POA: Diagnosis present

## 2015-07-30 DIAGNOSIS — J432 Centrilobular emphysema: Secondary | ICD-10-CM | POA: Diagnosis not present

## 2015-07-30 DIAGNOSIS — Z9114 Patient's other noncompliance with medication regimen: Secondary | ICD-10-CM

## 2015-07-30 DIAGNOSIS — I340 Nonrheumatic mitral (valve) insufficiency: Secondary | ICD-10-CM | POA: Diagnosis not present

## 2015-07-30 DIAGNOSIS — I420 Dilated cardiomyopathy: Secondary | ICD-10-CM | POA: Insufficient documentation

## 2015-07-30 DIAGNOSIS — Z886 Allergy status to analgesic agent status: Secondary | ICD-10-CM

## 2015-07-30 DIAGNOSIS — I5032 Chronic diastolic (congestive) heart failure: Secondary | ICD-10-CM | POA: Insufficient documentation

## 2015-07-30 DIAGNOSIS — Z885 Allergy status to narcotic agent status: Secondary | ICD-10-CM | POA: Diagnosis not present

## 2015-07-30 DIAGNOSIS — K219 Gastro-esophageal reflux disease without esophagitis: Secondary | ICD-10-CM | POA: Diagnosis present

## 2015-07-30 DIAGNOSIS — E669 Obesity, unspecified: Secondary | ICD-10-CM | POA: Diagnosis present

## 2015-07-30 DIAGNOSIS — Z888 Allergy status to other drugs, medicaments and biological substances status: Secondary | ICD-10-CM

## 2015-07-30 DIAGNOSIS — I429 Cardiomyopathy, unspecified: Secondary | ICD-10-CM | POA: Diagnosis present

## 2015-07-30 DIAGNOSIS — R531 Weakness: Secondary | ICD-10-CM | POA: Insufficient documentation

## 2015-07-30 DIAGNOSIS — Z79899 Other long term (current) drug therapy: Secondary | ICD-10-CM | POA: Diagnosis not present

## 2015-07-30 DIAGNOSIS — M069 Rheumatoid arthritis, unspecified: Secondary | ICD-10-CM | POA: Diagnosis present

## 2015-07-30 DIAGNOSIS — E119 Type 2 diabetes mellitus without complications: Secondary | ICD-10-CM | POA: Diagnosis present

## 2015-07-30 DIAGNOSIS — F329 Major depressive disorder, single episode, unspecified: Secondary | ICD-10-CM | POA: Diagnosis present

## 2015-07-30 DIAGNOSIS — I110 Hypertensive heart disease with heart failure: Secondary | ICD-10-CM | POA: Diagnosis present

## 2015-07-30 DIAGNOSIS — R0602 Shortness of breath: Secondary | ICD-10-CM | POA: Insufficient documentation

## 2015-07-30 DIAGNOSIS — I471 Supraventricular tachycardia, unspecified: Secondary | ICD-10-CM | POA: Diagnosis present

## 2015-07-30 DIAGNOSIS — E785 Hyperlipidemia, unspecified: Secondary | ICD-10-CM | POA: Diagnosis present

## 2015-07-30 DIAGNOSIS — F419 Anxiety disorder, unspecified: Secondary | ICD-10-CM | POA: Diagnosis present

## 2015-07-30 DIAGNOSIS — I5042 Chronic combined systolic (congestive) and diastolic (congestive) heart failure: Secondary | ICD-10-CM | POA: Diagnosis present

## 2015-07-30 DIAGNOSIS — Z7984 Long term (current) use of oral hypoglycemic drugs: Secondary | ICD-10-CM

## 2015-07-30 DIAGNOSIS — Z6833 Body mass index (BMI) 33.0-33.9, adult: Secondary | ICD-10-CM

## 2015-07-30 DIAGNOSIS — F17200 Nicotine dependence, unspecified, uncomplicated: Secondary | ICD-10-CM | POA: Insufficient documentation

## 2015-07-30 DIAGNOSIS — M1990 Unspecified osteoarthritis, unspecified site: Secondary | ICD-10-CM | POA: Diagnosis present

## 2015-07-30 DIAGNOSIS — I452 Bifascicular block: Secondary | ICD-10-CM | POA: Diagnosis present

## 2015-07-30 DIAGNOSIS — R0609 Other forms of dyspnea: Secondary | ICD-10-CM | POA: Diagnosis not present

## 2015-07-30 DIAGNOSIS — Z882 Allergy status to sulfonamides status: Secondary | ICD-10-CM

## 2015-07-30 LAB — CBC WITH DIFFERENTIAL/PLATELET
Basophils Absolute: 0.1 10*3/uL (ref 0–0.1)
Basophils Relative: 1 %
EOS ABS: 0.1 10*3/uL (ref 0–0.7)
Eosinophils Relative: 1 %
HEMATOCRIT: 38.5 % (ref 35.0–47.0)
HEMOGLOBIN: 12.3 g/dL (ref 12.0–16.0)
LYMPHS ABS: 3.5 10*3/uL (ref 1.0–3.6)
LYMPHS PCT: 27 %
MCH: 27.7 pg (ref 26.0–34.0)
MCHC: 31.8 g/dL — AB (ref 32.0–36.0)
MCV: 86.9 fL (ref 80.0–100.0)
MONOS PCT: 5 %
Monocytes Absolute: 0.7 10*3/uL (ref 0.2–0.9)
NEUTROS PCT: 66 %
Neutro Abs: 8.3 10*3/uL — ABNORMAL HIGH (ref 1.4–6.5)
Platelets: 233 10*3/uL (ref 150–440)
RBC: 4.44 MIL/uL (ref 3.80–5.20)
RDW: 16.3 % — ABNORMAL HIGH (ref 11.5–14.5)
WBC: 12.6 10*3/uL — ABNORMAL HIGH (ref 3.6–11.0)

## 2015-07-30 LAB — COMPREHENSIVE METABOLIC PANEL
ALBUMIN: 3.1 g/dL — AB (ref 3.5–5.0)
ALK PHOS: 78 U/L (ref 38–126)
ALT: 17 U/L (ref 14–54)
AST: 30 U/L (ref 15–41)
Anion gap: 7 (ref 5–15)
BILIRUBIN TOTAL: 0.3 mg/dL (ref 0.3–1.2)
BUN: 19 mg/dL (ref 6–20)
CO2: 28 mmol/L (ref 22–32)
Calcium: 8.9 mg/dL (ref 8.9–10.3)
Chloride: 106 mmol/L (ref 101–111)
Creatinine, Ser: 1 mg/dL (ref 0.44–1.00)
GFR calc Af Amer: >60 mL/min (ref >60)
GFR calc non Af Amer: 53 mL/min — ABNORMAL LOW (ref >60)
GLUCOSE: 118 mg/dL — AB (ref 65–99)
POTASSIUM: 3.2 mmol/L — AB (ref 3.5–5.1)
Sodium: 141 mmol/L (ref 135–145)
TOTAL PROTEIN: 7.5 g/dL (ref 6.5–8.1)

## 2015-07-30 LAB — PHOSPHORUS
PHOSPHORUS: 3.3 mg/dL (ref 2.5–4.6)
Phosphorus: 3.6 mg/dL (ref 2.5–4.6)

## 2015-07-30 LAB — MAGNESIUM
Magnesium: 2.1 mg/dL (ref 1.7–2.4)
Magnesium: 2.2 mg/dL (ref 1.7–2.4)

## 2015-07-30 LAB — LIPASE, BLOOD: Lipase: 36 U/L (ref 11–51)

## 2015-07-30 LAB — TROPONIN I: Troponin I: <0.03 ng/mL (ref <0.031)

## 2015-07-30 MED ORDER — ALBUTEROL SULFATE (2.5 MG/3ML) 0.083% IN NEBU: 3.0000 mL | INHALATION_SOLUTION | Freq: Four times a day (QID) | RESPIRATORY_TRACT | Status: DC | PRN

## 2015-07-30 MED ORDER — PANTOPRAZOLE SODIUM 40 MG PO TBEC
40.0000 mg | DELAYED_RELEASE_TABLET | Freq: Two times a day (BID) | ORAL | Status: DC
  Administered 2015-07-31: 40 mg via ORAL
  Filled 2015-07-30 (×3): qty 1

## 2015-07-30 MED ORDER — DOCUSATE SODIUM 100 MG PO CAPS
100.0000 mg | ORAL_CAPSULE | Freq: Two times a day (BID) | ORAL | Status: DC
  Administered 2015-07-30 – 2015-08-01 (×3): 100 mg via ORAL
  Filled 2015-07-30 (×4): qty 1

## 2015-07-30 MED ORDER — METOPROLOL TARTRATE 1 MG/ML IV SOLN
INTRAVENOUS | Status: AC
  Administered 2015-07-30: 5 mg via INTRAVENOUS
  Filled 2015-07-30: qty 5

## 2015-07-30 MED ORDER — ADENOSINE 6 MG/2ML IV SOLN
INTRAVENOUS | Status: DC
Start: 2015-07-30 — End: 2015-07-30
  Filled 2015-07-30: qty 2

## 2015-07-30 MED ORDER — LORAZEPAM 0.5 MG PO TABS
0.5000 mg | ORAL_TABLET | Freq: Three times a day (TID) | ORAL | Status: DC | PRN
  Administered 2015-07-30 – 2015-08-01 (×4): 0.5 mg via ORAL
  Filled 2015-07-30 (×4): qty 1

## 2015-07-30 MED ORDER — TRAMADOL HCL 50 MG PO TABS
50.0000 mg | ORAL_TABLET | Freq: Four times a day (QID) | ORAL | Status: DC | PRN
  Administered 2015-07-31 – 2015-08-01 (×2): 50 mg via ORAL
  Filled 2015-07-30 (×2): qty 1

## 2015-07-30 MED ORDER — ONDANSETRON HCL 4 MG/2ML IJ SOLN: 4.0000 mg | Freq: Four times a day (QID) | INTRAMUSCULAR | Status: DC | PRN

## 2015-07-30 MED ORDER — METOPROLOL TARTRATE 1 MG/ML IV SOLN
5.0000 mg | Freq: Once | INTRAVENOUS | Status: AC
  Administered 2015-07-30: 5 mg via INTRAVENOUS

## 2015-07-30 MED ORDER — LORAZEPAM 2 MG/ML IJ SOLN
1.0000 mg | Freq: Once | INTRAMUSCULAR | Status: AC
  Administered 2015-07-30: 1 mg via INTRAVENOUS
  Filled 2015-07-30: qty 1

## 2015-07-30 MED ORDER — METOPROLOL TARTRATE 1 MG/ML IV SOLN
INTRAVENOUS | Status: AC
  Filled 2015-07-30: qty 5

## 2015-07-30 MED ORDER — AMIODARONE HCL IN DEXTROSE 360-4.14 MG/200ML-% IV SOLN
30.0000 mg/h | INTRAVENOUS | Status: DC
  Administered 2015-07-30: 30 mg/h via INTRAVENOUS
  Filled 2015-07-30 (×3): qty 200

## 2015-07-30 MED ORDER — ONDANSETRON HCL 4 MG PO TABS: 4.0000 mg | ORAL_TABLET | Freq: Four times a day (QID) | ORAL | Status: DC | PRN

## 2015-07-30 MED ORDER — SENNOSIDES-DOCUSATE SODIUM 8.6-50 MG PO TABS: 1.0000 | ORAL_TABLET | Freq: Every evening | ORAL | Status: DC | PRN

## 2015-07-30 MED ORDER — METOPROLOL SUCCINATE ER 50 MG PO TB24
50.0000 mg | ORAL_TABLET | Freq: Every day | ORAL | Status: DC
  Administered 2015-07-31: 50 mg via ORAL
  Filled 2015-07-30: qty 1

## 2015-07-30 MED ORDER — ENOXAPARIN SODIUM 40 MG/0.4ML ~~LOC~~ SOLN
40.0000 mg | SUBCUTANEOUS | Status: DC
  Administered 2015-07-30 – 2015-07-31 (×2): 40 mg via SUBCUTANEOUS
  Filled 2015-07-30 (×2): qty 0.4

## 2015-07-30 MED ORDER — AMIODARONE LOAD VIA INFUSION
150.0000 mg | Freq: Once | INTRAVENOUS | Status: AC
  Administered 2015-07-30: 150 mg via INTRAVENOUS
  Filled 2015-07-30 (×2): qty 83.34

## 2015-07-30 MED ORDER — DEXTROSE 5 % IV SOLN
60.0000 mg/h | Freq: Once | INTRAVENOUS | Status: DC
  Filled 2015-07-30: qty 9

## 2015-07-30 MED ORDER — SODIUM CHLORIDE 0.9 % IV SOLN
Freq: Once | INTRAVENOUS | Status: AC
  Administered 2015-07-30: 18:00:00 via INTRAVENOUS

## 2015-07-30 MED ORDER — ADENOSINE 12 MG/4ML IV SOLN
INTRAVENOUS | Status: AC
  Filled 2015-07-30: qty 8

## 2015-07-30 MED ORDER — AMIODARONE HCL IN DEXTROSE 360-4.14 MG/200ML-% IV SOLN
60.0000 mg/h | INTRAVENOUS | Status: AC
  Administered 2015-07-30: 60 mg/h via INTRAVENOUS
  Filled 2015-07-30: qty 200

## 2015-07-30 MED ORDER — POTASSIUM CHLORIDE CRYS ER 20 MEQ PO TBCR
40.0000 meq | EXTENDED_RELEASE_TABLET | Freq: Once | ORAL | Status: AC
  Administered 2015-07-30: 40 meq via ORAL
  Filled 2015-07-30: qty 2

## 2015-07-30 NOTE — ED Notes (Signed)
Dr patel in with pt and family for admission.  No chest pain.  Sob impoved with ativan.

## 2015-07-30 NOTE — ED Notes (Signed)
EKG performed.

## 2015-07-30 NOTE — ED Notes (Signed)
Pt brought in by [facility] with rapid heart rate. Rate 148 at time of arrival. Pt in and out of SVT en route with EMS. Pt feels like she cannot breathe per her report. Denies CP.

## 2015-07-30 NOTE — Progress Notes (Signed)
Pt. admitted to unit, rm 249. Oriented to room, call bell, Ascom phones and staff. Bed in low position, bed alarm on. Full assessment to Epic; amio gtt infusing with no adverse effects, telemetry verified with tele clerk; skin assessed with doll ferguson, RN; will continue to monitor.

## 2015-07-30 NOTE — ED Notes (Signed)
Resumed care from ally rn.  Pt alert.  Sinus on monitor at 78.  No chest pain,  Pt has sob.  2nd iv started.  Pt alert. Skin warm, and dry.  Pt on 2 liters oxygen.

## 2015-07-30 NOTE — ED Provider Notes (Signed)
[facility] Emergency Department Provider Note  ____________________________________________  Time seen: 2:40 PM  I have reviewed the triage vital signs and the nursing notes.   HISTORY  Chief Complaint Tachycardia    HPI [name] [name] is a 79 y.o. female who presents with palpitations and shortness of breath. She has a history of SVT as prescribed metoprolol and amiodarone to control her rhythm. However, she reports she does not take the albuterol because it gives her headache. She has not followed up with her cardiologist Dr. Brandy Hale in some time. Denies chest pain right now. Denies syncope. Feels very anxious.     Past Medical History  Diagnosis Date  . Hypertension   . Hyperlipidemia   . COPD (chronic obstructive pulmonary disease) ([place])     a. With ongoing tobacco use.  Marland Kitchen GERD (gastroesophageal reflux disease)   . Anxiety   . Rheumatoid arthritis ([place])   . Osteoarthritis   . Hyperuricemia   . Chronic combined systolic and diastolic CHF, NYHA class 1 ([place])     a. 12/2013 Echo: EF 30-35%, DD, mid-distal anterior wall, apical and mid-apical inferior/posterior WMAs, mild MR/TR, normal RVSP.   Marland Kitchen Renal insufficiency   . Thyroid nodule   . Lung nodule   . Tobacco abuse   . S/P cardiac cath     a. 2008 Cath: nl cors.  . SVT (supraventricular tachycardia) ([place])     a. 12/2013 & 01/2014-->broke with adenosine.  . Pulmonary nodule, left     a.  H/O 1.7 cm nodule anterior medial base of the left lower lobe, not seen on 12/2013 CXR [facility]).  . Collagen vascular disease [facility])      Patient Active Problem List   Diagnosis Date Noted  . SVT (supraventricular tachycardia) ([place]) 01/23/2014  . Frequent PVCs 04/14/2013  . Cough 04/14/2013  . Chronic combined systolic and diastolic CHF (congestive heart failure) ([place]) 01/26/2013  . Sinus tachycardia ([place]) 01/26/2013  . Diabetes mellitus, new onset ([place]) 01/26/2013  . Essential hypertension 01/26/2013  .  Hyperlipidemia 01/26/2013  . TOBACCO ABUSE 06/20/2010  . UNSPECIFIED TACHYCARDIA 06/20/2010     Past Surgical History  Procedure Laterality Date  . Bowel obstruction    . Tubal ligation    . Cardiac catheterization  2008    [facility]     Current Outpatient Rx  Name  Route  Sig  Dispense  Refill  . amiodarone (PACERONE) 200 MG tablet   Oral   Take 1 tablet (200 mg total) by mouth 2 (two) times daily. Patient not taking: Reported on 06/07/2015   60 tablet   6   . furosemide (LASIX) 20 MG tablet   Oral   Take 1 tablet (20 mg total) by mouth 2 (two) times daily as needed for edema.   60 tablet   6   . LORazepam (ATIVAN) 0.5 MG tablet   Oral   Take 0.5 mg by mouth every 8 (eight) hours as needed for anxiety.         . metoprolol succinate (TOPROL-XL) 50 MG 24 hr tablet   Oral   Take 1 tablet (50 mg total) by mouth 2 (two) times daily. Patient not taking: Reported on 06/27/2015   60 tablet   11   . nitroGLYCERIN (NITROSTAT) 0.4 MG SL tablet   Sublingual   Place 0.4 mg under the tongue every 5 (five) minutes as needed for chest pain.         Marland Kitchen ondansetron (ZOFRAN ODT)  4 MG disintegrating tablet   Oral   Take 1 tablet (4 mg total) by mouth every 8 (eight) hours as needed for nausea or vomiting.   20 tablet   0   . pantoprazole (PROTONIX) 40 MG tablet   Oral   Take 40 mg by mouth 2 (two) times daily.         . predniSONE (DELTASONE) 10 MG tablet   Oral   Take 10 mg by mouth daily.      0   . traMADol (ULTRAM) 50 MG tablet   Oral   Take 50 mg by mouth every 6 (six) hours as needed for moderate pain or severe pain.             Allergies Benadryl; Lisinopril; Percocet; and Sulfa antibiotics   Family History  Problem Relation Age of Onset  . Coronary artery disease Neg Hx     Early  . Stroke Neg Hx     CVA  . Hypertension Other   . Diabetes Other   . Hypertension Mother     Social History Social History  Substance Use Topics  . Smoking status:  Current Some Day Smoker -- 0.25 packs/day for 50 years    Types: Cigarettes  . Smokeless tobacco: None  . Alcohol Use: No    Review of Systems  Constitutional:   No fever or chills. No weight changes Eyes:   No blurry vision or double vision.  ENT:   No sore throat. Cardiovascular:   No chest pain. Respiratory:   Positive dyspnea without cough. Gastrointestinal:   Negative for abdominal pain, vomiting and diarrhea.  No BRBPR or melena. Genitourinary:   Negative for dysuria, urinary retention, bloody urine, or difficulty urinating. Musculoskeletal:   Negative for back pain. No joint swelling or pain. Skin:   Negative for rash. Neurological:   Negative for headaches, focal weakness or numbness. Psychiatric:  Positive anxiety without depression.   Endocrine:  No hot/cold intolerance, changes in energy, or sleep difficulty.  10-point ROS otherwise negative.  ____________________________________________   PHYSICAL EXAM:  VITAL SIGNS: ED Triage Vitals  Enc Vitals Group     BP 07/30/15 1500 151/101 mmHg     Pulse Rate 07/30/15 1439 135     Resp 07/30/15 1439 19     Temp 07/30/15 1439 98.5 F (36.9 C)     Temp Source 07/30/15 1439 Oral     SpO2 07/30/15 1439 98 %     Weight 07/30/15 1439 196 lb (88.905 kg)     Height 07/30/15 1439 '5\' 4"'$  (1.626 m)     Head Cir --      Peak Flow --      Pain Score 07/30/15 1441 0     Pain Loc --      Pain Edu? --      Excl. in [place]? --      Constitutional:   Alert and oriented. Well appearing and in no distress. Eyes:   No scleral icterus. No conjunctival pallor. PERRL. EOMI ENT   Head:   Normocephalic and atraumatic.   Nose:   No congestion/rhinnorhea. No septal hematoma   Mouth/Throat:   MMM, no pharyngeal erythema. No peritonsillar mass. No uvula shift.   Neck:   No stridor. No SubQ emphysema. No meningismus. Hematological/Lymphatic/Immunilogical:   No cervical lymphadenopathy. Cardiovascular:   Initially on my evaluation  patient is in normal sinus rhythm with a heart rate of about 80, however during my interview, patient suddenly went  into SVT with a heart rate of about 180.Marland Kitchen Normal and symmetric distal pulses are present in all extremities. No murmurs, rubs, or gallops. Respiratory:   Normal respiratory effort without tachypnea nor retractions. Breath sounds are clear and equal bilaterally. No wheezes/rales/rhonchi. Gastrointestinal:   Soft and nontender. No distention. There is no CVA tenderness.  No rebound, rigidity, or guarding. Genitourinary:   deferred Musculoskeletal:   Nontender with normal range of motion in all extremities. No joint effusions.  No lower extremity tenderness.  No edema. Neurologic:   Normal speech and language.  CN 2-10 normal. Motor grossly intact. No pronator drift.  Normal gait. No gross focal neurologic deficits are appreciated.  Skin:    Skin is warm, dry and intact. No rash noted.  No petechiae, purpura, or bullae. Psychiatric:   Mood and affect are anxious. Speech and behavior are normal. Patient exhibits appropriate insight and judgment.  ____________________________________________    LABS (pertinent positives/negatives) (all labs ordered are listed, but only abnormal results are displayed) Labs Reviewed  COMPREHENSIVE METABOLIC PANEL  LIPASE, BLOOD  TROPONIN I  CBC WITH DIFFERENTIAL/PLATELET  MAGNESIUM  PHOSPHORUS   ____________________________________________   EKG EKG is interpreted by me Initial EKG at 2:41 PM shows sinus rhythm rate of 87, left axis, normal intervals, normal QRS ST segments and T waves. Repeat EKG at 2:42 PM shows SVT with left axis, normal intervals, absent P waves. Tachycardic at 165. Normal ST segments and T waves. Some weights to show narrow complex but overall there is a widened complex with right bundle branch block  ____________________________________________     RADIOLOGY    ____________________________________________   PROCEDURES CRITICAL CARE Performed by: Joni Fears, Kristen Fromm   Total critical care time: 35 minutes  Critical care time was exclusive of separately billable procedures and treating other patients.  Critical care was necessary to treat or prevent imminent or life-threatening deterioration.  Critical care was time spent personally by me on the following activities: development of treatment plan with patient and/or surrogate as well as nursing, discussions with consultants, evaluation of patient's response to treatment, examination of patient, obtaining history from patient or surrogate, ordering and performing treatments and interventions, ordering and review of laboratory studies, ordering and review of radiographic studies, pulse oximetry and re-evaluation of patient's condition.   ____________________________________________   INITIAL IMPRESSION / ASSESSMENT AND PLAN / ED COURSE  Pertinent labs & imaging results that were available during my care of the patient were reviewed by me and considered in my medical decision making (see chart for details).  Patient presents with recurrent SVT due to amiodarone noncompliance. Since she is spontaneously going in and out of SVT, I think that breaking in SVT episode with adenosine will be helpful as she is likely to relapse into SVT again. Therefore, since she is taking metoprolol only currently, the patient was given multiple doses of IV metoprolol with again only temporary improvement of her heart rate and rhythm.  Given the patient's previous noncompliance with amiodarone, I discussed the case with Dr. Mariea Clonts from cardiology for advice on a preferred second agent to add to the metoprolol. He advises that due to the patient's underlying pathology, amiodarone remains the best choice. We will reload her with an amiodarone bolus and drip and plan for admission to the hospital. The  patient's very anxious so she was given Ativan 1 mg IV low suspicion for ACS TAD PE or sepsis.  ----------------------------------------- 4:03 PM on 07/30/2015 -----------------------------------------  Heart rate improved, sinus rhythm about  80. Discussed with the hospitalist for admission.     ____________________________________________   FINAL CLINICAL IMPRESSION(S) / ED DIAGNOSES  Final diagnoses:  SVT (supraventricular tachycardia) ([place])      Carrie Mew, MD 07/30/15 [phone]

## 2015-07-30 NOTE — Progress Notes (Addendum)
BNP resulted and K is 3.2; dr patel paged and made aware and gave RN verbal order for 55mq KCl PO x1.

## 2015-07-30 NOTE — H&P (Signed)
[facility] [place] NAME: [name] [name]    MR#:  [id number]  DATE OF BIRTH:  1936-06-10  DATE OF ADMISSION:  07/30/2015  PRIMARY CARE PHYSICIAN: [facility]   REQUESTING/REFERRING PHYSICIAN: Dr Joni Fears  CHIEF COMPLAINT:  Palpitations on and off this afternoon  HISTORY OF PRESENT ILLNESS:  [name] [name]  is a 79 y.o. female with a known history of cardio myopathy EF of 69-62% plus diastolic dysfunction, hypertension, history of smoking with ongoing tobacco abuse, hyperlipidemia, rheumatoid arthritis, GERD presents to the emergency room after she started having palpitations and felt like her heart was going to jump out. Her daughter called EMS for heart. At that time was 77 however while in route she became tachycardic and in the emergency room it has been anywhere from 75-165. She was found to have wide complex QRS tachycardia/SVT received IV metoprolol 5 mg 2. Cardiac consultation was obtained with Dr. Fletcher Anon and recommended patient be given amiodarone loading dose along with amiodarone drip. During my evaluation patient's heart rate is 70-75 blood pressure is stable. She earlier had some chest pain secondary to her palpitation at present she is asymptomatic. She is being admitted for further rash and management  PAST MEDICAL HISTORY:   Past Medical History  Diagnosis Date  . Hypertension   . Hyperlipidemia   . COPD (chronic obstructive pulmonary disease) ([place])     a. With ongoing tobacco use.  Marland Kitchen GERD (gastroesophageal reflux disease)   . Anxiety   . Rheumatoid arthritis ([place])   . Osteoarthritis   . Hyperuricemia   . Chronic combined systolic and diastolic CHF, NYHA class 1 ([place])     a. 12/2013 Echo: EF 30-35%, DD, mid-distal anterior wall, apical and mid-apical inferior/posterior WMAs, mild MR/TR, normal RVSP.   Marland Kitchen Renal insufficiency   . Thyroid nodule   . Lung nodule   . Tobacco abuse   . S/P cardiac cath    a. 2008 Cath: nl cors.  . SVT (supraventricular tachycardia) ([place])     a. 12/2013 & 01/2014-->broke with adenosine.  . Pulmonary nodule, left     a.  H/O 1.7 cm nodule anterior medial base of the left lower lobe, not seen on 12/2013 CXR [facility]).  . Collagen vascular disease ([place])     PAST SURGICAL HISTOIRY:   Past Surgical History  Procedure Laterality Date  . Bowel obstruction    . Tubal ligation    . Cardiac catheterization  2008    [facility]    SOCIAL HISTORY:   Social History  Substance Use Topics  . Smoking status: Current Some Day Smoker -- 0.25 packs/day for 50 years    Types: Cigarettes  . Smokeless tobacco: Not on file  . Alcohol Use: No    FAMILY HISTORY:   Family History  Problem Relation Age of Onset  . Coronary artery disease Neg Hx     Early  . Stroke Neg Hx     CVA  . Hypertension Other   . Diabetes Other   . Hypertension Mother     DRUG ALLERGIES:   Allergies  Allergen Reactions  . Benadryl [Diphenhydramine Hcl] Hives  . Lisinopril Cough  . Percocet [Oxycodone-Acetaminophen] Other (See Comments)    Reaction:  Dizziness   . Sulfa Antibiotics Hives    REVIEW OF SYSTEMS:  Review of Systems  Constitutional: Negative for fever, chills and weight loss.  HENT: Negative for ear discharge, ear pain and nosebleeds.  Eyes: Negative for blurred vision, pain and discharge.  Respiratory: Positive for shortness of breath. Negative for sputum production, wheezing and stridor.   Cardiovascular: Positive for chest pain and palpitations. Negative for orthopnea and PND.  Gastrointestinal: Negative for nausea, vomiting, abdominal pain and diarrhea.  Genitourinary: Negative for urgency and frequency.  Musculoskeletal: Negative for back pain and joint pain.  Neurological: Positive for weakness. Negative for sensory change, speech change and focal weakness.  Psychiatric/Behavioral: Negative for depression and hallucinations. The patient is nervous/anxious.   All other  systems reviewed and are negative.    MEDICATIONS AT HOME:   Prior to Admission medications   Medication Sig Start Date End Date Taking? Authorizing Provider  albuterol (PROVENTIL HFA;VENTOLIN HFA) 108 (90 BASE) MCG/ACT inhaler Inhale 2 puffs into the lungs every 6 (six) hours as needed for wheezing or shortness of breath.   Yes Historical Provider, MD  metoprolol succinate (TOPROL-XL) 50 MG 24 hr tablet Take 50 mg by mouth daily.   Yes Historical Provider, MD  pantoprazole (PROTONIX) 40 MG tablet Take 40 mg by mouth 2 (two) times daily.   Yes Historical Provider, MD  amiodarone (PACERONE) 200 MG tablet Take 1 tablet (200 mg total) by mouth 2 (two) times daily. Patient not taking: Reported on 06/07/2015 02/15/15   Minna Merritts, MD  furosemide (LASIX) 20 MG tablet Take 1 tablet (20 mg total) by mouth 2 (two) times daily as needed for edema. Patient not taking: Reported on 07/30/2015 02/15/15   Minna Merritts, MD  LORazepam (ATIVAN) 0.5 MG tablet Take 0.5 mg by mouth every 8 (eight) hours as needed for anxiety.    Historical Provider, MD  metoprolol succinate (TOPROL-XL) 50 MG 24 hr tablet Take 1 tablet (50 mg total) by mouth 2 (two) times daily. Patient not taking: Reported on 06/27/2015 02/15/15   Minna Merritts, MD  ondansetron (ZOFRAN ODT) 4 MG disintegrating tablet Take 1 tablet (4 mg total) by mouth every 8 (eight) hours as needed for nausea or vomiting. Patient not taking: Reported on 07/30/2015 06/03/15   Harvest Dark, MD  predniSONE (DELTASONE) 10 MG tablet Take 10 mg by mouth daily. 05/08/15   Historical Provider, MD  traMADol (ULTRAM) 50 MG tablet Take 50 mg by mouth every 6 (six) hours as needed for moderate pain or severe pain.     Historical Provider, MD      VITAL SIGNS:  Blood pressure 124/81, pulse 85, temperature 98.5 F (36.9 C), temperature source Oral, resp. rate 12, height '5\' 4"'$  (1.626 m), weight 88.905 kg (196 lb), SpO2 100 %.  PHYSICAL EXAMINATION:  GENERAL:  79  y.o.-year-old patient lying in the bed with no acute distress. Morbid obesity EYES: Pupils equal, round, reactive to light and accommodation. No scleral icterus. Extraocular muscles intact.  HEENT: Head atraumatic, normocephalic. Oropharynx and nasopharynx clear.  NECK:  Supple, no jugular venous distention. No thyroid enlargement, no tenderness.  LUNGS: Normal breath sounds bilaterally, no wheezing, rales,rhonchi or crepitation. No use of accessory muscles of respiration.  CARDIOVASCULAR: S1, S2 normal. No murmurs, rubs, or gallops.  ABDOMEN: Soft, nontender, nondistended. Bowel sounds present. No organomegaly or mass.  EXTREMITIES: No pedal edema, cyanosis, or clubbing.  NEUROLOGIC: Cranial nerves II through XII are intact. Muscle strength 5/5 in all extremities. Sensation intact. Gait not checked.  PSYCHIATRIC: The patient is alert and oriented x 3.  SKIN: No obvious rash, lesion, or ulcer.   LABORATORY PANEL:   CBC No results for input(s): WBC, HGB,  HCT, PLT in the last 168 hours. ------------------------------------------------------------------------------------------------------------------  Chemistries  No results for input(s): NA, K, CL, CO2, GLUCOSE, BUN, CREATININE, CALCIUM, MG, AST, ALT, ALKPHOS, BILITOT in the last 168 hours.  Invalid input(s): GFRCGP ------------------------------------------------------------------------------------------------------------------  Cardiac Enzymes No results for input(s): TROPONINI in the last 168 hours. ------------------------------------------------------------------------------------------------------------------  RADIOLOGY:  No results found.  EKG:  Quite complex tachycardia repeat EKG shows sinus rhythm with APCs  IMPRESSION AND PLAN:   [name] [name]  is a 79 y.o. female with a known history of cardio myopathy EF of 44-03% plus diastolic dysfunction, hypertension, history of smoking with ongoing tobacco abuse,  hyperlipidemia, rheumatoid arthritis, GERD presents to the emergency room after she started having palpitations and felt like her heart was going to jump out  1. SVT, recurrent Admit to telemetry Continue amiodarone drip Resume by mouth metoprolol home dose Cardiology consultation with Dr.arida Cycle cardiac enzymes 3, check mag and phosphorus replete if needed  2. History of congestive heart failure combined systolic and diastolic dysfunction per echo in 2014 showed LVEF of 30-35% Patient does not appear to be in CHF will continue her home meds  3. Type 2 diabetes borderline patient not on any meds We will give her sliding scale insulin  4. Hypertension continue home meds  5. Generalized weakness physical therapy to see patient. She at baseline uses cane to walk  6. Hyperlipidemia continue  7. DVT prophylaxis subcutaneous Lovenox  8 tobacco abuse smoking cessation advice about 4 minutes spent All the records are reviewed and case discussed with ED provider. Management plans discussed with the patient, family and they are in agreement.  CODE STATUS: Full  TOTAL CRITICAL TIME TAKING CARE OF THIS PATIENT: 50 minutes.    Naithen Rivenburg M.D on 07/30/2015 at 4:32 PM  Between 7am to 6pm - Pager - [phone]  After 6pm go to [email or web address] - password [email or web address] [facility]  Office  [phone]  CC: Primary care physician; [place]

## 2015-07-30 NOTE — ED Notes (Signed)
Pt calls out for nurse for SOB, pt sitting up in bed short of breath, 02 sats at 99%. MD notified, MD will order Ativan '1mg'$ .

## 2015-07-31 ENCOUNTER — Inpatient Hospital Stay (HOSPITAL_COMMUNITY)
Admit: 2015-07-31 | Discharge: 2015-07-31 | Disposition: A | Discharge status: Home / Self Care | Payer: Medicare Other | Attending: Cardiovascular Disease | Admitting: Cardiovascular Disease

## 2015-07-31 DIAGNOSIS — J432 Centrilobular emphysema: Secondary | ICD-10-CM | POA: Insufficient documentation

## 2015-07-31 DIAGNOSIS — I471 Supraventricular tachycardia: Principal | ICD-10-CM

## 2015-07-31 DIAGNOSIS — R0609 Other forms of dyspnea: Secondary | ICD-10-CM

## 2015-07-31 DIAGNOSIS — I10 Essential (primary) hypertension: Secondary | ICD-10-CM

## 2015-07-31 DIAGNOSIS — R0602 Shortness of breath: Secondary | ICD-10-CM | POA: Insufficient documentation

## 2015-07-31 DIAGNOSIS — I5032 Chronic diastolic (congestive) heart failure: Secondary | ICD-10-CM | POA: Insufficient documentation

## 2015-07-31 DIAGNOSIS — I420 Dilated cardiomyopathy: Secondary | ICD-10-CM

## 2015-07-31 DIAGNOSIS — I340 Nonrheumatic mitral (valve) insufficiency: Secondary | ICD-10-CM

## 2015-07-31 LAB — BASIC METABOLIC PANEL
Anion gap: 3 — ABNORMAL LOW (ref 5–15)
BUN: 14 mg/dL (ref 6–20)
CHLORIDE: 108 mmol/L (ref 101–111)
CO2: 25 mmol/L (ref 22–32)
CREATININE: 0.92 mg/dL (ref 0.44–1.00)
Calcium: 8.4 mg/dL — ABNORMAL LOW (ref 8.9–10.3)
GFR, EST NON AFRICAN AMERICAN: 59 mL/min — AB (ref >60)
Glucose, Bld: 105 mg/dL — ABNORMAL HIGH (ref 65–99)
Potassium: 4.1 mmol/L (ref 3.5–5.1)
SODIUM: 136 mmol/L (ref 135–145)

## 2015-07-31 MED ORDER — NICOTINE 14 MG/24HR TD PT24
14.0000 mg | MEDICATED_PATCH | Freq: Every day | TRANSDERMAL | Status: DC
  Administered 2015-07-31 – 2015-08-01 (×2): 14 mg via TRANSDERMAL
  Filled 2015-07-31 (×2): qty 1

## 2015-07-31 MED ORDER — POTASSIUM CHLORIDE CRYS ER 20 MEQ PO TBCR
40.0000 meq | EXTENDED_RELEASE_TABLET | ORAL | Status: AC
  Administered 2015-07-31 (×3): 40 meq via ORAL
  Filled 2015-07-31 (×4): qty 2

## 2015-07-31 MED ORDER — LOSARTAN POTASSIUM 50 MG PO TABS
50.0000 mg | ORAL_TABLET | Freq: Every day | ORAL | Status: DC
  Administered 2015-07-31 – 2015-08-01 (×2): 50 mg via ORAL
  Filled 2015-07-31 (×2): qty 1

## 2015-07-31 MED ORDER — DILTIAZEM HCL ER COATED BEADS 120 MG PO CP24
120.0000 mg | ORAL_CAPSULE | Freq: Every day | ORAL | Status: DC
Start: 2015-07-31 — End: 2015-08-01
  Administered 2015-07-31 – 2015-08-01 (×2): 120 mg via ORAL
  Filled 2015-07-31 (×2): qty 1

## 2015-07-31 MED ORDER — POTASSIUM CHLORIDE CRYS ER 20 MEQ PO TBCR: 40.0000 meq | EXTENDED_RELEASE_TABLET | Freq: Once | ORAL | Status: DC

## 2015-07-31 MED ORDER — AMIODARONE HCL 200 MG PO TABS
400.0000 mg | ORAL_TABLET | Freq: Two times a day (BID) | ORAL | Status: DC
  Administered 2015-07-31 – 2015-08-01 (×3): 400 mg via ORAL
  Filled 2015-07-31 (×4): qty 2

## 2015-07-31 NOTE — Care Management (Signed)
Patient admitted from home with SVT requiring Amiodarone drip.  The drip has since been discontinued and now on oral amiodarone.  Cardiology consult has been completed and anticipate patient will discharge within the next 24 hours if all remains stable.  No transportation issue or problems accessing medical care or obtaining meds.  Patient Physical therapy has recommend home health physical therapy.  Patient will also benefit from home health nurse to follow cardiac status.  Patient 's primary support is her daughter with whom she lives.  Current with her PCP

## 2015-07-31 NOTE — Evaluation (Signed)
Physical Therapy Evaluation Patient Details Name: [name] [name] MRN: [id number] DOB: 05-Aug-1936 Today's Date: 07/31/2015   History of Present Illness  Patient is a 79 y/o female with shortness of breath, found to have atelectasis and pneumonia. Per patient she stays at home with her daughter. She came in with SVT, off amioderone drip now with HR controlled.   Clinical Impression  Patient is a limited ambulator at baseline and appears to be roughly at this level currently. She does not demonstrate any loss of balance during ambulation with RW, however she typically uses a SPC at home. Of note she states she has had trouble keeping up with her hygiene at home as she is afraid to use the shower due to falling. OT consult discussed with RN for alternative bathing options. Patient does fatigue quickly with ambulation, increasing her falls risk. Skilled PT services are indicated to address her balance deficits.     Follow Up Recommendations Home health PT    Equipment Recommendations  Rolling walker with 5" wheels    Recommendations for Other Services OT consult     Precautions / Restrictions Precautions Precautions: Fall Restrictions Weight Bearing Restrictions: No      Mobility  Bed Mobility               General bed mobility comments: Patient in chair upon PT arrival.   Transfers Overall transfer level: Needs assistance Equipment used: Rolling walker (2 wheeled) Transfers: Sit to/from Stand Sit to Stand: Supervision         General transfer comment: Patient used her hands appropriately for transfer, no loss of balance and appropriate speed noted.   Ambulation/Gait Ambulation/Gait assistance: Supervision Ambulation Distance (Feet): 125 Feet Assistive device: Rolling walker (2 wheeled) Gait Pattern/deviations: WFL(Within Functional Limits)   Gait velocity interpretation: Below normal speed for age/gender General Gait Details: Patient with reciprocal gait  pattern with RW, no loss of balance. No deficits noted.   Stairs            Wheelchair Mobility    Modified Rankin (Stroke Patients Only)       Balance Overall balance assessment: Needs assistance Sitting-balance support: No upper extremity supported Sitting balance-Leahy Scale: Good Sitting balance - Comments: No balance deficits noted.    Standing balance support: Bilateral upper extremity supported Standing balance-Leahy Scale: Fair Standing balance comment: Modified DGI of 9/12 with RW.                              Pertinent Vitals/Pain Pain Assessment:  (Patient does not mention pain in this session)    Home Living Family/patient expects to be discharged to:: Private residence Living Arrangements: Children Available Help at Discharge: Family (Daughter is not available 24/7 per patient) Type of Home: House Home Access: Stairs to enter   [employer] of Steps: 2 Home Layout: One [place]: [place] - 4 wheels;Cane - single point      Prior Function Level of Independence: Independent with assistive device(s)         Comments: Patient has most recently been ambulating with a SPC. Denies any falls.      Hand Dominance        Extremity/Trunk Assessment   Upper Extremity Assessment: Overall WFL for tasks assessed           Lower Extremity Assessment: Overall WFL for tasks assessed         Communication   Communication: No  difficulties  Cognition Arousal/Alertness: Awake/alert Behavior During Therapy: WFL for tasks assessed/performed Overall Cognitive Status: Within Functional Limits for tasks assessed                      General Comments      Exercises        Assessment/Plan    PT Assessment Patient needs continued PT services  PT Diagnosis Difficulty walking   PT Problem List Decreased strength;Decreased knowledge of use of DME;Decreased safety awareness;Cardiopulmonary status limiting  activity;Decreased mobility  PT Treatment Interventions DME instruction;Gait training;Stair training;Functional mobility training;Therapeutic activities;Therapeutic exercise;Balance training   PT Goals (Current goals can be found in the Care Plan section) Acute Rehab PT Goals Patient Stated Goal: To return home safely  PT Goal Formulation: With patient Time For Goal Achievement: 08/14/15 Potential to Achieve Goals: Good    Frequency Min 2X/week   Barriers to discharge Decreased caregiver support Daughter apparently is available PRN    Co-evaluation               End of Session Equipment Utilized During Treatment: Gait belt Activity Tolerance: Patient tolerated treatment well Patient left: in chair;with call bell/phone within reach Nurse Communication: Mobility status (That patient did not have alarm on, RN stated she would place one)         Time: 1510-1520 PT Time Calculation (min) (ACUTE ONLY): 10 min   Charges:   PT Evaluation $Initial PT Evaluation Tier I: 1 Procedure     PT G Codes:       Kerman Passey, PT, DPT    07/31/2015, 4:09 PM

## 2015-07-31 NOTE — Progress Notes (Signed)
Patient asking for nicotene patch, MD paged and gave RN verbal order for nicotene patch Q24h.

## 2015-07-31 NOTE — Progress Notes (Signed)
PT Cancellation Note  Patient Details Name: [name] [name] MRN: [id number] DOB: 06-17-1936   Cancelled Treatment:    Reason Eval/Treat Not Completed: Patient at procedure or test/unavailable. Patient currently off the floor for test/procedure. PT will re-attempt mobility evaluation as patient is available/appropriate.   Kerman Passey, PT, DPT    07/31/2015, 9:51 AM

## 2015-07-31 NOTE — Progress Notes (Signed)
[facility] [place] NAME: [name] [name]    MR#:  [id number]  DATE OF BIRTH:  October 02, 1935  SUBJECTIVE:  CHIEF COMPLAINT:   Chief Complaint  Patient presents with  . Tachycardia  Say she is feeling 80% better, eating breakfast. REVIEW OF SYSTEMS:  Review of Systems  Constitutional: Negative for fever, weight loss, malaise/fatigue and diaphoresis.  HENT: Negative for ear discharge, ear pain, hearing loss, nosebleeds, sore throat and tinnitus.   Eyes: Negative for blurred vision and pain.  Respiratory: Negative for cough, hemoptysis, shortness of breath and wheezing.   Cardiovascular: Negative for chest pain, palpitations, orthopnea and leg swelling.  Gastrointestinal: Negative for heartburn, nausea, vomiting, abdominal pain, diarrhea, constipation and blood in stool.  Genitourinary: Negative for dysuria, urgency and frequency.  Musculoskeletal: Negative for myalgias and back pain.  Skin: Negative for itching and rash.  Neurological: Negative for dizziness, tingling, tremors, focal weakness, seizures, weakness and headaches.  Psychiatric/Behavioral: Negative for depression. The patient is not nervous/anxious.    DRUG ALLERGIES:   Allergies  Allergen Reactions  . Benadryl [Diphenhydramine Hcl] Hives  . Lisinopril Cough  . Percocet [Oxycodone-Acetaminophen] Other (See Comments)    Reaction:  Dizziness   . Sulfa Antibiotics Hives   VITALS:  Blood pressure 157/83, pulse 89, temperature 98.4 F (36.9 C), temperature source Oral, resp. rate 20, height '5\' 4"'$  (1.626 m), weight 88.587 kg (195 lb 4.8 oz), SpO2 94 %. PHYSICAL EXAMINATION:  Physical Exam  Constitutional: She is oriented to person, place, and time and well-developed, well-nourished, and in no distress.  HENT:  Head: Normocephalic and atraumatic.  Eyes: Conjunctivae and EOM are normal. Pupils are equal, round, and reactive to light.  Neck: Normal range of motion. Neck  supple. No tracheal deviation present. No thyromegaly present.  Cardiovascular: Normal rate, regular rhythm and normal heart sounds.   Pulmonary/Chest: Effort normal and breath sounds normal. No respiratory distress. She has no wheezes. She exhibits no tenderness.  Abdominal: Soft. Bowel sounds are normal. She exhibits no distension. There is no tenderness.  Musculoskeletal: Normal range of motion.  Neurological: She is alert and oriented to person, place, and time. No cranial nerve deficit.  Skin: Skin is warm and dry. No rash noted.  Psychiatric: Mood and affect normal.   LABORATORY PANEL:   CBC  Recent Labs Lab 07/30/15 1443  WBC 12.6*  HGB 12.3  HCT 38.5  PLT 233   ------------------------------------------------------------------------------------------------------------------ Chemistries   Recent Labs Lab 07/30/15 1443 07/30/15 1948  NA 141  --   K 3.2*  --   CL 106  --   CO2 28  --   GLUCOSE 118*  --   BUN 19  --   CREATININE 1.00  --   CALCIUM 8.9  --   MG 2.1 2.2  AST 30  --   ALT 17  --   ALKPHOS 78  --   BILITOT 0.3  --    RADIOLOGY:  Dg Chest Portable 1 View  07/30/2015  CLINICAL DATA:  Shortness of breath and heart fluttering today. History of COPD, hypertension, and irregular heartbeat. Current smoker. EXAM: PORTABLE CHEST 1 VIEW COMPARISON:  06/27/2015 FINDINGS: The patient is rotated to the right. Allowing for this, the cardiomediastinal silhouette is unchanged with the heart likely upper limits of normal in size. There is persistent abnormal opacity in the left lung base which is similar to the prior study. New, minimal right basilar opacity is also present. A  small left pleural effusion is not excluded. No pneumothorax is identified. No acute osseous abnormality is seen. IMPRESSION: 1. Similar appearance of left basilar opacity which could reflect pneumonia. 2. New, minimal right basilar infiltrate versus atelectasis. Electronically Signed   By: Logan Bores M.D.   On: 07/30/2015 16:36   ASSESSMENT AND PLAN:  [name] [name] is a 79 y.o. female with a known history of cardio myopathy EF of 03-21% plus diastolic dysfunction, hypertension, history of smoking with ongoing tobacco abuse, hyperlipidemia, rheumatoid arthritis, GERD presents to the emergency room after she started having palpitations and felt like her heart was going to jump out  1. SVT recurrent Off amiodarone drip. Started on amiodarone 400 mg twice a day, 2 weeks, then 200 BID. She is not interested in ablation per cardio discussion. Hold metoprolol as she is not taking this ("causes a headache") Started diltiazem 120 mg daily  2. Chronic congestive heart failure combined systolic and diastolic dysfunction per echo in 2014 showed LVEF of 30-35% Patient does not appear to be in CHF will continue her home meds Repeat echo pending.  3. Type 2 diabetes borderline patient not on any meds continue sliding scale insulin  4. Hypertension continue home meds  5. Generalized weakness physical therapy to see patient. She at baseline uses cane to walk  6. Hyperlipidemia continue  7. DVT prophylaxis subcutaneous Lovenox     All the records are reviewed and case discussed with Care Management/Social Worker. Management plans discussed with the patient, family and they are in agreement.  CODE STATUS: Full Code  TOTAL TIME TAKING CARE OF THIS PATIENT: 35 minutes.   More than 50% of the time was spent in counseling/coordination of care: YES  POSSIBLE D/C IN AM, DEPENDING ON CLINICAL CONDITION.   [facility], Jasani Lengel M.D on 07/31/2015 at 10:48 AM  Between 7am to 6pm - Pager - [phone]  After 6pm go to [email or web address] - password [email or web address] [facility]  Office  [phone]  CC:  Primary care physician; [place]

## 2015-07-31 NOTE — Consult Note (Signed)
Cardiology Consultation Note  Patient ID: [name] [name], MRN: [id number], DOB/AGE: 01/14/1936 79 y.o. Admit date: 07/30/2015   Date of Consult: 07/31/2015 Primary Physician: [facility] Primary Cardiologist: Rockey Situ, tim  Chief Complaint: tachycardia Reason for Consult: SVT, SOB  HPI: 79 y.o. female with h/o smoking who continues to smoke, combined CHF (EF 30-35%, improved up to :55% on echo in 3154,  + diastolic dysfunction) HTN, HLD, COPD, tobacco abuse, rheumatoid arthritis, borderline DM2 and GERD. She presents to there hospital with tachycardia, malaise, anxiety.  She presents to the emergency room after she started having palpitations and felt like her heart was going to jump out.  in route she became tachycardic and in the emergency room it has been anywhere from 75-165. She was found to have wide complex QRS tachycardia/SVT received IV metoprolol 5 mg 2.   Cardiology recommended patient be given amiodarone loading dose along with amiodarone drip.   She reports metoprolol is giving her a headache and is not taking it Significant anxiety with her tachycardia episodes. Tearful Chronic SOB, still smoking She says she is dyspneic at less than 20 feet. She has 2 pillow orthopnea.   On last clinic visit 5/16 she continued to have episodes of SVT She has called EMTs 6 times since 2015 for SVT, often requiring adenosine. With these episodes she has shortness of breath and chest discomfort. Last episode was April 2016. These episodes are made her feel nervous as she is often scared she will go into arrhythmia She continues to smoke daily   lung nodule seen on CT scan of the chest, 1.7 cm ACE inhibitor cough  hospital admission April 21 with discharge 01/12/2014. Diagnosed with SVT, non-ST elevation MI, cardiomyopathy with ejection fraction 30%,  Heart rate initially measured at 170 beats per minute. Given adenosine. Troponin up to 0.23. Metoprolol was increased up to 50  mg daily.  Also started on Cymbalta for depression. Depression exacerbated by loss of several family members. Imaging in the hospital also showed lung nodules recommendation suggested with primary care. 1.7 cm nodule anterior medial base of the left lower lobe  Recent recurrence of her SVT may 32,015 with heart rate up to 200 beats per minute requiring adenosine. Evaluation in the emergency room that evening and discharged home after negative cardiac enzymes She felt that she was having an anxiety attack. Notes indicate she converted to normal sinus rhythm after adenosine was given confirming SVT.  cardiac catheterization in 2008 which revealed normal cors. She was evaluated for tachycardia/palpitations at that time which were managed by AVN blockers.  Previously admitted to [facility] April 2014 for cough and shortness of breath attributed to bronchitis. Cardiology was consulted due to elevated cardiac enzymes (trend 0.31->0.61->0.23).   2D echo 01/18/2013: LVEF 30-35%, mod global LV systolic dysfunction, diastolic dysfunction, mid-distal anterior wall, apical and mid-apical inferior/posterior WMAs, mild MR/TR, normal RVSP.   Hgb A1C 7.3%, LDL 96, TC 147  She notes chronic DOE/SOB which she attributes to smoking and COPD. occasional palpitations.   Daughter passed away from lung cancer. Other family members with medical issues as well Staying with her son at nighttime    Past Medical History  Diagnosis Date  . Hypertension   . Hyperlipidemia   . COPD (chronic obstructive pulmonary disease) ([place])     a. With ongoing tobacco use.  Marland Kitchen GERD (gastroesophageal reflux disease)   . Anxiety   . Rheumatoid arthritis ([place])   . Osteoarthritis   . Hyperuricemia   .  Chronic combined systolic and diastolic CHF, NYHA class 1 ([place])     a. 12/2013 Echo: EF 30-35%, DD, mid-distal anterior wall, apical and mid-apical inferior/posterior WMAs, mild MR/TR, normal RVSP.   Marland Kitchen Renal insufficiency   . Thyroid  nodule   . Lung nodule   . Tobacco abuse   . S/P cardiac cath     a. 2008 Cath: nl cors.  . SVT (supraventricular tachycardia) ([place])     a. 12/2013 & 01/2014-->broke with adenosine.  . Pulmonary nodule, left     a.  H/O 1.7 cm nodule anterior medial base of the left lower lobe, not seen on 12/2013 CXR [facility]).  . Collagen vascular disease ([place])       Most Recent Cardiac Studies: Echo pending Prior echo in 2015 with normal EF echo in 2014 with depressed EF 35%   Surgical History:  Past Surgical History  Procedure Laterality Date  . Bowel obstruction    . Tubal ligation    . Cardiac catheterization  2008    [facility]     Home Meds: Prior to Admission medications   Medication Sig Start Date End Date Taking? Authorizing Provider  albuterol (PROVENTIL HFA;VENTOLIN HFA) 108 (90 BASE) MCG/ACT inhaler Inhale 2 puffs into the lungs every 6 (six) hours as needed for wheezing or shortness of breath.   Yes Historical Provider, MD  LORazepam (ATIVAN) 0.5 MG tablet Take 0.5 mg by mouth every 8 (eight) hours as needed for anxiety.   Yes Historical Provider, MD  metoprolol succinate (TOPROL-XL) 50 MG 24 hr tablet Take 50 mg by mouth daily.   Yes Historical Provider, MD  pantoprazole (PROTONIX) 40 MG tablet Take 40 mg by mouth 2 (two) times daily.   Yes Historical Provider, MD  predniSONE (DELTASONE) 10 MG tablet Take 10 mg by mouth daily.   Yes Historical Provider, MD  traMADol (ULTRAM) 50 MG tablet Take 50 mg by mouth every 6 (six) hours as needed for moderate pain.    Yes Historical Provider, MD  amiodarone (PACERONE) 200 MG tablet Take 1 tablet (200 mg total) by mouth 2 (two) times daily. Patient not taking: Reported on 06/07/2015 02/15/15   Minna Merritts, MD  furosemide (LASIX) 20 MG tablet Take 1 tablet (20 mg total) by mouth 2 (two) times daily as needed for edema. Patient not taking: Reported on 07/30/2015 02/15/15   Minna Merritts, MD  metoprolol succinate (TOPROL-XL) 50 MG 24 hr tablet Take 1  tablet (50 mg total) by mouth 2 (two) times daily. Patient not taking: Reported on 06/27/2015 02/15/15   Minna Merritts, MD  ondansetron (ZOFRAN ODT) 4 MG disintegrating tablet Take 1 tablet (4 mg total) by mouth every 8 (eight) hours as needed for nausea or vomiting. Patient not taking: Reported on 07/30/2015 06/03/15   Harvest Dark, MD    Inpatient Medications:  . amiodarone  400 mg Oral BID WC  . docusate sodium  100 mg Oral BID  . enoxaparin (LOVENOX) injection  40 mg Subcutaneous Q24H  . metoprolol succinate  50 mg Oral Daily  . pantoprazole  40 mg Oral BID  . potassium chloride  40 mEq Oral Q3H      Allergies:  Allergies  Allergen Reactions  . Benadryl [Diphenhydramine Hcl] Hives  . Lisinopril Cough  . Percocet [Oxycodone-Acetaminophen] Other (See Comments)    Reaction:  Dizziness   . Sulfa Antibiotics Hives    Social History   Social History  . Marital Status: Widowed  Spouse Name: N/A  . Number of Children: N/A  . Years of Education: N/A   Occupational History  . Not on file.   Social History Main Topics  . Smoking status: Current Some Day Smoker -- 0.25 packs/day for 50 years    Types: Cigarettes  . Smokeless tobacco: Not on file  . Alcohol Use: No  . Drug Use: No  . Sexual Activity: Not on file   Other Topics Concern  . Not on file   Social History Narrative   Widowed     Family History  Problem Relation Age of Onset  . Coronary artery disease Neg Hx     Early  . Stroke Neg Hx     CVA  . Hypertension Other   . Diabetes Other   . Hypertension Mother      Review of Systems: Review of Systems  Constitutional: Negative.   Respiratory: Positive for shortness of breath.   Cardiovascular: Positive for palpitations.  Gastrointestinal: Negative.   Musculoskeletal: Negative.   Neurological: Negative.   Psychiatric/Behavioral: The patient is nervous/anxious.   All other systems reviewed and are negative.   Labs:  Recent Labs   07/30/15 1443  TROPONINI <0.03   Lab Results  Component Value Date   WBC 12.6* 07/30/2015   HGB 12.3 07/30/2015   HCT 38.5 07/30/2015   MCV 86.9 07/30/2015   PLT 233 07/30/2015    Recent Labs Lab 07/30/15 1443  NA 141  K 3.2*  CL 106  CO2 28  BUN 19  CREATININE 1.00  CALCIUM 8.9  PROT 7.5  BILITOT 0.3  ALKPHOS 78  ALT 17  AST 30  GLUCOSE 118*   Lab Results  Component Value Date   CHOL 147 01/19/2013   HDL 35* 01/19/2013   LDLCALC 96 01/19/2013   TRIG 78 01/19/2013   No results found for: DDIMER  Radiology/Studies:  Dg Chest Portable 1 View  07/30/2015  CLINICAL DATA:  Shortness of breath and heart fluttering today. History of COPD, hypertension, and irregular heartbeat. Current smoker. EXAM: PORTABLE CHEST 1 VIEW COMPARISON:  06/27/2015 FINDINGS: The patient is rotated to the right. Allowing for this, the cardiomediastinal silhouette is unchanged with the heart likely upper limits of normal in size. There is persistent abnormal opacity in the left lung base which is similar to the prior study. New, minimal right basilar opacity is also present. A small left pleural effusion is not excluded. No pneumothorax is identified. No acute osseous abnormality is seen. IMPRESSION: 1. Similar appearance of left basilar opacity which could reflect pneumonia. 2. New, minimal right basilar infiltrate versus atelectasis. Electronically Signed   By: Logan Bores M.D.   On: 07/30/2015 16:36    EKG:  WCT with RBBB/FAFB 320 msec  Weights: Filed Weights   07/30/15 1439 07/30/15 1747  Weight: 196 lb (88.905 kg) 195 lb 4.8 oz (88.587 kg)     Physical Exam:TELE WITH nsr Blood pressure 157/83, pulse 89, temperature 98.4 F (36.9 C), temperature source Oral, resp. rate 20, height '5\' 4"'$  (1.626 m), weight 195 lb 4.8 oz (88.587 kg), SpO2 94 %. Body mass index is 33.51 kg/(m^2). General: Well developed, well nourished, in no acute distress. Head: Normocephalic, atraumatic, sclera  non-icteric, no xanthomas, nares are without discharge.  Neck: Negative for carotid bruits. JVD not elevated. Lungs: decreased BS throughout Heart: RRR with S1 S2. No murmurs, rubs, or gallops appreciated. Abdomen: Soft, non-tender, non-distended with normoactive bowel sounds. No hepatomegaly. No rebound/guarding. No obvious abdominal  masses. Msk:  Strength and tone appear normal for age. Extremities: No clubbing or cyanosis. No edema.  Distal pedal pulses are 2+ and equal bilaterally. Neuro: Alert and oriented X 3. No facial asymmetry. No focal deficit. Moves all extremities spontaneously. Psych:  Responds to questions appropriately, tearful at times    Assessment and Plan:      SVT (supraventricular tachycardia) ([place]) -         She continues to have symptomatic SVT.  We will start amiodarone 400 mg twice a day, 2 weeks, then 200 BID.  She is not interested in ablation Hold metoprolol as she is not taking this ("causes a headache") --Start diltiazem 120 mg daily             Chronic combined systolic and diastolic CHF (congestive heart failure) -          Echo pending Prior echo in 2015 with normal EF Start losartan 50 mg daily Lasix PRN, needs to take with potassium            Essential hypertension - Minna Merritts, MD  PM          Diltiazem and losartan as above Metoprolol causes headache per the patient            Hyperlipidemia - Minna Merritts, MD              Hugo, MD          We have encouraged her to continue to work on weaning her cigarettes and smoking cessation. She will continue to work on this and does not want any assistance with chantix.           Signed, Esmond Plants, MD [facility] 07/31/2015, 9:22 AM

## 2015-07-31 NOTE — Consult Note (Signed)
ELECTROPHYSIOLOGY CONSULT NOTE  Patient ID: [name] [name], MRN: [id number], DOB/AGE: 1936/06/19 79 y.o. Admit date: 07/30/2015 Date of Consult: 07/31/2015  Primary Physician: [place] Primary Cardiologist: TG  Chief Complaint: SVT   HPI [name] [name] is a 79 y.o. female  Admitted ONCE AGAIN for adenosine responsive SVT in setting of RBBB/LAFB these episodes are associated with shortness of breath palpitations chest pain; they are frog negative and diuretic negative. They have been going on for 20-30 years increasingly frequently. When seen about a year ago she had finally acquiesced to the idea of catheter ablation. An appointment was made in [place]; she failed to show.  She was treated with IV amiodarone this occasion; in the office, 5/16, she was started on amiodarone with subsequent follow-up office notes say she wasn't taking it.  Those notes reiterated that she was not interested in catheter ablation  She has a history of a cardiomyopathy previously identified with an EF of 30-35% selective symptoms legconfirm 4/15. Interestingly, 10/15 ejection fraction was reported as normal. She does have moderate LVH and evidence of diastolic dysfunction. Remotely, 2008, she underwent catheterization demonstrated coronary arteries.  She has significant shortness of breath. She says she is dyspneic at less than 20 feet. She has 2 pillow orthopnea. This is ascribed to COPD from tobacco use.  At baseline she does not have significant peripheral edema. She does not have exertional chest discomfort. Most recently her troponins have been negative; in 2000 1501 occasions were positive. No interval imaging studies have been done that I can find.  Cardiac risk factors are notable for smoking, hypertension.  Lipid panel from 2014 was notable for support of the metabolic syndrome with a low HDL in the context of her hypertension and obesity  Her past medical history is also  notable for rheumatoid arthritis and reflux disease. She has not seen pulmonary.    Past Medical History  Diagnosis Date  . Hypertension   . Hyperlipidemia   . COPD (chronic obstructive pulmonary disease) ([place])     a. With ongoing tobacco use.  Marland Kitchen GERD (gastroesophageal reflux disease)   . Anxiety   . Rheumatoid arthritis ([place])   . Osteoarthritis   . Hyperuricemia   . Chronic combined systolic and diastolic CHF, NYHA class 1 ([place])     a. 12/2013 Echo: EF 30-35%, DD, mid-distal anterior wall, apical and mid-apical inferior/posterior WMAs, mild MR/TR, normal RVSP.   Marland Kitchen Renal insufficiency   . Thyroid nodule   . Lung nodule   . Tobacco abuse   . S/P cardiac cath     a. 2008 Cath: nl cors.  . SVT (supraventricular tachycardia) ([place])     a. 12/2013 & 01/2014-->broke with adenosine.  . Pulmonary nodule, left     a.  H/O 1.7 cm nodule anterior medial base of the left lower lobe, not seen on 12/2013 CXR [facility]).  . Collagen vascular disease [facility]       Surgical History:  Past Surgical History  Procedure Laterality Date  . Bowel obstruction    . Tubal ligation    . Cardiac catheterization  2008    [facility]     Home Meds: Prior to Admission medications   Medication Sig Start Date End Date Taking? Authorizing Provider  albuterol (PROVENTIL HFA;VENTOLIN HFA) 108 (90 BASE) MCG/ACT inhaler Inhale 2 puffs into the lungs every 6 (six) hours as needed for wheezing or shortness of breath.   Yes Historical Provider, MD  LORazepam (ATIVAN) 0.5  MG tablet Take 0.5 mg by mouth every 8 (eight) hours as needed for anxiety.   Yes Historical Provider, MD  metoprolol succinate (TOPROL-XL) 50 MG 24 hr tablet Take 50 mg by mouth daily.   Yes Historical Provider, MD  pantoprazole (PROTONIX) 40 MG tablet Take 40 mg by mouth 2 (two) times daily.   Yes Historical Provider, MD  predniSONE (DELTASONE) 10 MG tablet Take 10 mg by mouth daily.   Yes Historical Provider, MD  traMADol (ULTRAM) 50 MG tablet Take 50 mg by  mouth every 6 (six) hours as needed for moderate pain.    Yes Historical Provider, MD  amiodarone (PACERONE) 200 MG tablet Take 1 tablet (200 mg total) by mouth 2 (two) times daily. Patient not taking: Reported on 06/07/2015 02/15/15   Minna Merritts, MD  furosemide (LASIX) 20 MG tablet Take 1 tablet (20 mg total) by mouth 2 (two) times daily as needed for edema. Patient not taking: Reported on 07/30/2015 02/15/15   Minna Merritts, MD  metoprolol succinate (TOPROL-XL) 50 MG 24 hr tablet Take 1 tablet (50 mg total) by mouth 2 (two) times daily. Patient not taking: Reported on 06/27/2015 02/15/15   Minna Merritts, MD  ondansetron (ZOFRAN ODT) 4 MG disintegrating tablet Take 1 tablet (4 mg total) by mouth every 8 (eight) hours as needed for nausea or vomiting. Patient not taking: Reported on 07/30/2015 06/03/15   Harvest Dark, MD    Inpatient Medications:  . docusate sodium  100 mg Oral BID  . enoxaparin (LOVENOX) injection  40 mg Subcutaneous Q24H  . metoprolol succinate  50 mg Oral Daily  . pantoprazole  40 mg Oral BID     Allergies:  Allergies  Allergen Reactions  . Benadryl [Diphenhydramine Hcl] Hives  . Lisinopril Cough  . Percocet [Oxycodone-Acetaminophen] Other (See Comments)    Reaction:  Dizziness   . Sulfa Antibiotics Hives    Social History   Social History  . Marital Status: Widowed    Spouse Name: N/A  . Number of Children: N/A  . Years of Education: N/A   Occupational History  . Not on file.   Social History Main Topics  . Smoking status: Current Some Day Smoker -- 0.25 packs/day for 50 years    Types: Cigarettes  . Smokeless tobacco: Not on file  . Alcohol Use: No  . Drug Use: No  . Sexual Activity: Not on file   Other Topics Concern  . Not on file   Social History Narrative   Widowed     Family History  Problem Relation Age of Onset  . Coronary artery disease Neg Hx     Early  . Stroke Neg Hx     CVA  . Hypertension Other   . Diabetes Other    . Hypertension Mother      ROS:  Please see the history of present illness.     All other systems reviewed and negative.    Physical Exam: Blood pressure 157/83, pulse 89, temperature 98.4 F (36.9 C), temperature source Oral, resp. rate 20, height '5\' 4"'$  (1.626 m), weight 195 lb 4.8 oz (88.587 kg), SpO2 94 %. General: Well developed, well nourished female in no acute distress. Head: Normocephalic, atraumatic, sclera non-icteric, no xanthomas, nares are without discharge. EENT: normal Lymph Nodes:  none Back: without scoliosis/kyphosis, no CVA tendersness Neck: Negative for carotid bruits. JVD not elevated. Lungs: Clear bilaterally to auscultation without wheezes, rales, or rhonchi. Breathing is unlabored. Heart: RRR  with S1 S2. No murmur , rubs, or gallops appreciated. Abdomen: Soft, non-tender, non-distended with normoactive bowel sounds. No hepatomegaly. No rebound/guarding. No obvious abdominal masses. Msk:  Strength and tone appear normal for age. Extremities: No clubbing or cyanosis. No edema.  Distal pedal pulses are 2+ and equal bilaterally. Skin: Warm and Dry Neuro: Alert and oriented X 3. CN III-XII intact Grossly normal sensory and motor function . Psych:  Responds to questions appropriately with a normal affect.      Labs: Cardiac Enzymes  Recent Labs  07/30/15 1443  TROPONINI <0.03   CBC Lab Results  Component Value Date   WBC 12.6* 07/30/2015   HGB 12.3 07/30/2015   HCT 38.5 07/30/2015   MCV 86.9 07/30/2015   PLT 233 07/30/2015   PROTIME: No results for input(s): LABPROT, INR in the last 72 hours. Chemistry  Recent Labs Lab 07/30/15 1443  NA 141  K 3.2*  CL 106  CO2 28  BUN 19  CREATININE 1.00  CALCIUM 8.9  PROT 7.5  BILITOT 0.3  ALKPHOS 78  ALT 17  AST 30  GLUCOSE 118*   Lipids Lab Results  Component Value Date   CHOL 147 01/19/2013   HDL 35* 01/19/2013   LDLCALC 96 01/19/2013   TRIG 78 01/19/2013   BNP No results found for:  PROBNP Thyroid Function Tests: No results for input(s): TSH, T4TOTAL, T3FREE, THYROIDAB in the last 72 hours.  Invalid input(s): FREET3    Miscellaneous No results found for: DDIMER  Radiology/Studies:  Dg Chest Portable 1 View  07/30/2015  CLINICAL DATA:  Shortness of breath and heart fluttering today. History of COPD, hypertension, and irregular heartbeat. Current smoker. EXAM: PORTABLE CHEST 1 VIEW COMPARISON:  06/27/2015 FINDINGS: The patient is rotated to the right. Allowing for this, the cardiomediastinal silhouette is unchanged with the heart likely upper limits of normal in size. There is persistent abnormal opacity in the left lung base which is similar to the prior study. New, minimal right basilar opacity is also present. A small left pleural effusion is not excluded. No pneumothorax is identified. No acute osseous abnormality is seen. IMPRESSION: 1. Similar appearance of left basilar opacity which could reflect pneumonia. 2. New, minimal right basilar infiltrate versus atelectasis. Electronically Signed   By: Logan Bores M.D.   On: 07/30/2015 16:36    EKG: WCT RBBB/FAFB 320 msec  NSR reviewed in the paper chart; it is not yet available in the computer. Right bundle branch left anterior fascicular block was noted   Assessment and Plan:  SVT  Dyspnea on exertion-class III  Cardiomyopathy?  Hypertensive heart disease  COPD   The patient has recurrent quite symptomatic SVT. I agree, with prior polysomnogram the catheter ablation with potentially serve this lady well; however, she remains averse to the idea of ablation.  Hence, drug therapy becomes our best option and I agree with Dr. Deidre Ala who sought to use amiodarone earlier this year. We will resume it.  Given her dyspnea, under history of the normal BNP, I would favor evaluation by pulmonary. Their input as to whether she might tolerate local anesthesia and recumbency for potentially curative procedure will be important for  her as she has been told that she could not tolerate any procedures at all. In addition, she is significantly limited by exercise intolerance. Diastolic dysfunction might be contributing as suggested by her LVH and a relatively rapid rates, but in the absence of edema and negative BNP, symptoms lbeg different explanation  Recommendations 1. Okay to discharge from a cardiovascular point of view.(see below) 2. Amiodarone 400 mg twice a day for one week 200 mg twice a day for 2 weeks and then 200 mg daily--dc IV amio  3.  Repeat echocardiogram-ordered  4 Will discuss with Dr. Deidre Ala as to whether catheterization is appropriate given the significant dyspnea on exertion and/or Myoview scanning.  This can be done as outpt 5 Consider outpt pulm evaluation  6  Replete K   i have ordered and repeat BMET at 1500h today Why is she on steroids   Symptoms leg [place]

## 2015-07-31 NOTE — Progress Notes (Signed)
*  PRELIMINARY RESULTS* Echocardiogram 2D Echocardiogram has been performed.  [name] [name] 07/31/2015, 9:57 AM

## 2015-08-01 DIAGNOSIS — F17200 Nicotine dependence, unspecified, uncomplicated: Secondary | ICD-10-CM | POA: Insufficient documentation

## 2015-08-01 DIAGNOSIS — Z720 Tobacco use: Secondary | ICD-10-CM

## 2015-08-01 DIAGNOSIS — R531 Weakness: Secondary | ICD-10-CM | POA: Insufficient documentation

## 2015-08-01 LAB — CBC
HEMATOCRIT: 37.1 % (ref 35.0–47.0)
HEMOGLOBIN: 11.8 g/dL — AB (ref 12.0–16.0)
MCH: 27.6 pg (ref 26.0–34.0)
MCHC: 31.8 g/dL — AB (ref 32.0–36.0)
MCV: 86.8 fL (ref 80.0–100.0)
Platelets: 229 10*3/uL (ref 150–440)
RBC: 4.27 MIL/uL (ref 3.80–5.20)
RDW: 16.1 % — AB (ref 11.5–14.5)
WBC: 8.7 10*3/uL (ref 3.6–11.0)

## 2015-08-01 LAB — BASIC METABOLIC PANEL
Anion gap: 7 (ref 5–15)
BUN: 11 mg/dL (ref 6–20)
CALCIUM: 9.2 mg/dL (ref 8.9–10.3)
CHLORIDE: 107 mmol/L (ref 101–111)
CO2: 24 mmol/L (ref 22–32)
CREATININE: 0.99 mg/dL (ref 0.44–1.00)
GFR calc non Af Amer: 54 mL/min — ABNORMAL LOW (ref >60)
GLUCOSE: 98 mg/dL (ref 65–99)
Potassium: 4.3 mmol/L (ref 3.5–5.1)
Sodium: 138 mmol/L (ref 135–145)

## 2015-08-01 MED ORDER — DILTIAZEM HCL ER COATED BEADS 120 MG PO CP24: 120.0000 mg | ORAL_CAPSULE | Freq: Every day | ORAL | Status: DC

## 2015-08-01 MED ORDER — LOSARTAN POTASSIUM 50 MG PO TABS: 50.0000 mg | ORAL_TABLET | Freq: Every day | ORAL | Status: DC

## 2015-08-01 MED ORDER — AMIODARONE HCL 400 MG PO TABS: 400.0000 mg | ORAL_TABLET | Freq: Two times a day (BID) | ORAL | Status: DC

## 2015-08-01 NOTE — Progress Notes (Signed)
Patient given discharge teaching and paperwork regarding medications, diet, follow-up appointments and activity. Patient understanding verbalized. No complaints at this time. IV and telemetry discontinued prior to leaving. Skin assessment as previously charted and vitals are stable; on room air. Patient being discharged to home. Caregiver/family present during discharge teaching. No further needs by Care Management. Prescriptions e-prescribed to [facility].

## 2015-08-01 NOTE — Evaluation (Signed)
Occupational Therapy Evaluation Patient Details Name: [name] [name] MRN: [id number] DOB: October 07, 1935 Today's Date: 08/01/2015    History of Present Illness Patient is a 79 y/o female with shortness of breath, found to have atelectasis and pneumonia. Per patient she stays at home with her daughter. She came in with SVT, off amioderone drip now with HR controlled.    Clinical Impression   Pt seen for OT evaluation only and is supposed to be discharged home today.  Rec OT HH and purchase a transfer tub bench at [employer] in [place], long handled sponge, and use her reacher when daughter is not home.  She has a basket for her FWW.  She is able to transition from supine to sit with supervision but has bouts of dizziness which increase when she leans forward at EOB for LB dressing.  She is able to complete ADLs with supervision but is at risk for falls and does not feel safe using shower seat with back since she has to step over side of tub.  Rec OT HH assist with increasing safety with bathing and pt given a catalog with picture of transfer tub bench for use with bathing.  Discussed rec with case manager, Naan.  No further OT needed in hospital since pt is going home today.     Follow Up Recommendations  Home health OT    Equipment Recommendations  Tub/shower bench (LH sponge for bathing)    Recommendations for Other Services       Precautions / Restrictions Precautions Precautions: Fall Restrictions Weight Bearing Restrictions: No      Mobility Bed Mobility                  Transfers                      Balance                                            ADL Overall ADL's : Needs assistance/impaired                                     Functional mobility during ADLs: Supervision/safety;Cueing for safety;Rolling walker General ADL Comments: Pt is able to complete ADLs with supervision due to bouts of  dizziness off and on which decreases safety during ADLs and functional mobility.  Pt is fearful of falling when bathing depsite having a shower chair, grab bar on wall and on tub side.  Rec purchasing a transfer tub bench to increase safety for bathing and OT HH to assess for safety for bathing skills and functional mobility in home during ADLs.     Vision     Perception     Praxis      Pertinent Vitals/Pain Pain Assessment: No/denies pain     Hand Dominance Right   Extremity/Trunk Assessment Upper Extremity Assessment Upper Extremity Assessment: Overall WFL for tasks assessed   Lower Extremity Assessment Lower Extremity Assessment: Defer to PT evaluation       Communication Communication Communication: No difficulties   Cognition Arousal/Alertness: Awake/alert Behavior During Therapy: WFL for tasks assessed/performed Overall Cognitive Status: Within Functional Limits for tasks assessed  General Comments       Exercises       Shoulder Instructions      Home Living Family/patient expects to be discharged to:: Private residence Living Arrangements: Children Available Help at Discharge: Family Type of Home: House Home Access: Stairs to enter Technical brewer of Steps: 2   Home Layout: One level     Bathroom Shower/Tub: Tub/shower unit Shower/tub characteristics: Architectural technologist: Standard Bathroom Accessibility: Yes How Accessible: Accessible via walker [place];Shower seat;Walker - 4 wheels;Cane - single point;Other (comment) (basket for FWW and has a Secondary school teacher)   Additional Comments: rec long handled sponge for bathing and transfer tub bench       Prior Functioning/Environment Level of Independence: Independent with assistive device(s)        Comments: Patient has most recently been ambulating with a SPC. Denies any falls.     OT Diagnosis: Generalized weakness (dizziness causing  decreased balance)   OT Problem List: Decreased activity tolerance;Impaired balance (sitting and/or standing);Decreased safety awareness   OT Treatment/Interventions:      OT Goals(Current goals can be found in the care plan section)    OT Frequency:     Barriers to D/C:            Co-evaluation              End of Session Equipment Utilized During Treatment:  (adaptive equipment catalog)  Activity Tolerance: Patient limited by fatigue Patient left: in bed;with call bell/phone within reach;with bed alarm set   Time: 1415-1445 OT Time Calculation (min): 30 min Charges:  OT General Charges $OT Visit: 1 Procedure OT Evaluation $Initial OT Evaluation Tier I: 1 Procedure OT Treatments $Self Care/Home Management : 8-22 mins G-Codes:    Wofford,Susan 08/23/15, 3:05 PM    Chrys Racer, OTR/L ascom [phone]

## 2015-08-01 NOTE — Progress Notes (Signed)
No further needs per Care Management, Nann G. Waiting for daughter to pick patient up.

## 2015-08-01 NOTE — Care Management (Addendum)
Spoke with patient regarding agency preference for home health service.  Her preference is [facility] as this agency followed her last year.  She is agreeable  to discharge and having nursing follow for cardiac status, physical therapy for ambulation.  Patient does have access to a cane and 2 walkers.  She has trouble with personal care and informed aide for personal care could see as long as a skilled discipline is following.  Patient says she is in need for some in home services and wonders if she has any benefits through the [place] "like my husband did."  Added social work to referral.  Patient daughter lives with her "but she is young and not home a lot."  Patient says she is able to ambulate and get her meals.  PCP is Dr Petra Kuba at [facility].  Confirmed address and contact information.  patient .  Obtained order for home health OT per OT recommendation.

## 2015-08-01 NOTE — Progress Notes (Signed)
Patient does not want to leave IV in. Dr. Manuella Ghazi notified, ok to remove IV because patient will be discharged today.

## 2015-08-01 NOTE — Discharge Instructions (Signed)
Heart Failure  Heart failure means your heart has trouble pumping blood. This makes it hard for your body to work well. Heart failure is usually a long-term (chronic) condition. You must take good care of yourself and follow your doctor's treatment plan.  HOME CARE   Take your heart medicine as told by your doctor.    Do not stop taking medicine unless your doctor tells you to.    Do not skip any dose of medicine.    Refill your medicines before they run out.    Take other medicines only as told by your doctor or pharmacist.   Stay active if told by your doctor. The elderly and people with severe heart failure should talk with a doctor about physical activity.   Eat heart-healthy foods. Choose foods that are without trans fat and are low in saturated fat, cholesterol, and salt (sodium). This includes fresh or frozen fruits and vegetables, fish, lean meats, fat-free or low-fat dairy foods, whole grains, and high-fiber foods. Lentils and dried peas and beans (legumes) are also good choices.   Limit salt if told by your doctor.   Cook in a healthy way. Roast, grill, broil, bake, poach, steam, or stir-fry foods.   Limit fluids as told by your doctor.   Weigh yourself every morning. Do this after you pee (urinate) and before you eat breakfast. Write down your weight to give to your doctor.   Take your blood pressure and write it down if your doctor tells you to.   Ask your doctor how to check your pulse. Check your pulse as told.   Lose weight if told by your doctor.   Stop smoking or chewing tobacco. Do not use gum or patches that help you quit without your doctor's approval.   Schedule and go to doctor visits as told.   Nonpregnant women should have no more than 1 drink a day. Men should have no more than 2 drinks a day. Talk to your doctor about drinking alcohol.   Stop illegal drug use.   Stay current with shots (immunizations).   Manage your health conditions as told by your doctor.   Learn to  manage your stress.   Rest when you are tired.   If it is really hot outside:    Avoid intense activities.    Use air conditioning or fans, or get in a cooler place.    Avoid caffeine and alcohol.    Wear loose-fitting, lightweight, and light-colored clothing.   If it is really cold outside:    Avoid intense activities.    Layer your clothing.    Wear mittens or gloves, a hat, and a scarf when going outside.    Avoid alcohol.   Learn about heart failure and get support as needed.   Get help to maintain or improve your quality of life and your ability to care for yourself as needed.  GET HELP IF:    You gain weight quickly.   You are more short of breath than usual.   You cannot do your normal activities.   You tire easily.   You cough more than normal, especially with activity.   You have any or more puffiness (swelling) in areas such as your hands, feet, ankles, or belly (abdomen).   You cannot sleep because it is hard to breathe.   You feel like your heart is beating fast (palpitations).   You get dizzy or light-headed when you stand up.  GET HELP   are not doing well or get worse.   This information is not intended to replace advice given to you by your health care provider. Make sure you discuss any questions you have with your health care provider.   Document Released: 06/17/2008 Document Revised: 09/29/2014 Document Reviewed: 10/25/2012 Elsevier Interactive Patient Education 2016 [place], [employer].  [phone]

## 2015-08-01 NOTE — Progress Notes (Signed)
Patient: [name] [name] / Admit Date: 07/30/2015 / Date of Encounter: 08/01/2015, 10:54 AM   Subjective:  No arrhythmia overnight Feel better no complaints,  Talk to family over the phone, they report that she lives alone, is getting weaker, needs help at home   patient reports she needs someone to help her with her bath She has been out of bed, worked with PT Previously reported metoprolol gave her headache. This was held, started on diltiazem  Review of Systems: Review of Systems  Respiratory: Negative.   Cardiovascular: Negative.   Gastrointestinal: Negative.   Musculoskeletal: Negative.   Neurological: Positive for weakness.  Psychiatric/Behavioral: Negative.   All other systems reviewed and are negative.    Objective: Telemetry: NSR Physical Exam: Blood pressure 136/78, pulse 87, temperature 98.8 F (37.1 C), temperature source Oral, resp. rate 20, height '5\' 4"'$  (1.626 m), weight 195 lb 4.8 oz (88.587 kg), SpO2 97 %. Body mass index is 33.51 kg/(m^2). General: Well developed, well nourished, in no acute distress. Head: Normocephalic, atraumatic, sclera non-icteric, no xanthomas, nares are without discharge. Neck: Negative for carotid bruits. JVP not elevated. Lungs: Clear bilaterally to auscultation without wheezes, rales, or rhonchi. Breathing is unlabored. Heart: RRR S1 S2 without murmurs, rubs, or gallops.  Abdomen: Soft, non-tender, non-distended with normoactive bowel sounds. No rebound/guarding. Extremities: No clubbing or cyanosis. No edema. Distal pedal pulses are 2+ and equal bilaterally. Neuro: Alert and oriented X 3. Moves all extremities spontaneously. Psych:  Responds to questions appropriately with a normal affect.   Intake/Output Summary (Last 24 hours) at 08/01/15 1054 Last data filed at 08/01/15 0947  Gross per 24 hour  Intake    600 ml  Output      0 ml  Net    600 ml    Inpatient Medications:  . amiodarone  400 mg Oral BID WC  .  diltiazem  120 mg Oral Daily  . docusate sodium  100 mg Oral BID  . enoxaparin (LOVENOX) injection  40 mg Subcutaneous Q24H  . losartan  50 mg Oral Daily  . nicotine  14 mg Transdermal Daily  . pantoprazole  40 mg Oral BID   Infusions:    Labs:  Recent Labs  07/30/15 1443 07/30/15 1948 07/31/15 1533 08/01/15 0420  NA 141  --  136 138  K 3.2*  --  4.1 4.3  CL 106  --  108 107  CO2 28  --  25 24  GLUCOSE 118*  --  105* 98  BUN 19  --  14 11  CREATININE 1.00  --  0.92 0.99  CALCIUM 8.9  --  8.4* 9.2  MG 2.1 2.2  --   --   PHOS 3.3 3.6  --   --     Recent Labs  07/30/15 1443  AST 30  ALT 17  ALKPHOS 78  BILITOT 0.3  PROT 7.5  ALBUMIN 3.1*    Recent Labs  07/30/15 1443 08/01/15 0420  WBC 12.6* 8.7  NEUTROABS 8.3*  --   HGB 12.3 11.8*  HCT 38.5 37.1  MCV 86.9 86.8  PLT 233 229    Recent Labs  07/30/15 1443  TROPONINI <0.03   Invalid input(s): POCBNP No results for input(s): HGBA1C in the last 72 hours.   Weights: Filed Weights   07/30/15 1439 07/30/15 1747  Weight: 196 lb (88.905 kg) 195 lb 4.8 oz (88.587 kg)     Radiology/Studies:  Dg Chest Portable 1 View  07/30/2015  IMPRESSION: 1. Similar appearance of left basilar opacity which could reflect pneumonia. 2. New, minimal right basilar infiltrate versus atelectasis. Electronically Signed   By: Logan Bores M.D.   On: 07/30/2015 16:36     Assessment and Plan  79 y.o. female      SVT (supraventricular tachycardia) (HCC) -        symptomatic SVT on arrival   continueone 400 mg twice a day, 2 weeks, then 200 BID. She is not interested in ablation Hold metoprolol as she is not taking this ("causes a headache") Continue diltiazem  120 mg daily, started this admission             Chronic combined systolic and diastolic CHF (congestive heart failure) -          echocardiogram this admission with normal ejection fraction  Prior echo in 2015 with normal  EF losartan 50 mg daily, started this admission  Lasix PRN, needs to take with potassium            Essential hypertension - Minna Merritts, MD PM         Diltiazem and losartan as above Metoprolol causes headache per the patient, Held             Hyperlipidemia - Minna Merritts, MD        Continue statin       TOBACCO ABUSE - Minna Merritts, MD         We have encouraged her to continue to work on weaning her cigarettes and smoking cessation. She will continue to work on this and does not want any assistance with chantix.              Signed, Esmond Plants, md [facility] 08/01/2015, 10:54 AM

## 2015-08-03 NOTE — Discharge Summary (Signed)
[facility] NAME: [name] [name]    MR#:  [id number]  DATE OF BIRTH:  06-26-1936  DATE OF ADMISSION:  07/30/2015 ADMITTING PHYSICIAN: Fritzi Mandes, MD  DATE OF DISCHARGE: 08/01/2015  4:01 PM  PRIMARY CARE PHYSICIAN: [facility] PRIMARY CARE Dr Juanell Fairly   ADMISSION DIAGNOSIS:  SVT (supraventricular tachycardia) (HCC) [I47.1]  DISCHARGE DIAGNOSIS:  Active Problems:   SVT (supraventricular tachycardia) (HCC)   Chronic diastolic CHF (congestive heart failure) (HCC)   SOB (shortness of breath)   Centrilobular emphysema (HCC)   Congestive dilated cardiomyopathy ([place])   Smoker   Weakness  SECONDARY DIAGNOSIS:   Past Medical History  Diagnosis Date  . Hypertension   . Hyperlipidemia   . COPD (chronic obstructive pulmonary disease) ([place])     a. With ongoing tobacco use.  Marland Kitchen GERD (gastroesophageal reflux disease)   . Anxiety   . Rheumatoid arthritis ([place])   . Osteoarthritis   . Hyperuricemia   . Chronic combined systolic and diastolic CHF, NYHA class 1 ([place])     a. 12/2013 Echo: EF 30-35%, DD, mid-distal anterior wall, apical and mid-apical inferior/posterior WMAs, mild MR/TR, normal RVSP.   Marland Kitchen Renal insufficiency   . Thyroid nodule   . Lung nodule   . Tobacco abuse   . S/P cardiac cath     a. 2008 Cath: nl cors.  . SVT (supraventricular tachycardia) ([place])     a. 12/2013 & 01/2014-->broke with adenosine.  . Pulmonary nodule, left     a.  H/O 1.7 cm nodule anterior medial base of the left lower lobe, not seen on 12/2013 CXR [facility]).  . Collagen vascular disease [facility])    HOSPITAL COURSE:  79 y.o. female with h/o smoking who continues to smoke, combined CHF (EF 30-35%, improved up to :55% on echo in 4259, + diastolic dysfunction) HTN, HLD, COPD, tobacco abuse, rheumatoid arthritis, borderline DM2 and GERD presented to emergency room after she started having palpitations and felt like her heart was going to jump out. in route  she became tachycardic and in the emergency room it has been anywhere from 75-165. She was found to have wide complex QRS tachycardia/SVT received IV metoprolol 5 mg 2 and was admitted for further evaluation and management. Please see Dr Gus Height Patel's dictated H & P for further details.  Cardio c/s was obtained who recommended starting 400 mg twice a day, 2 weeks, then 200 BID. She was not interested in ablation and metoprolol was held as she was not taking it ("causes a headache"). She was  Continued on diltiazemat 120 mg daily, started this admission. Her urine c/s grew E.coli post D/C but as she was asymptomatic I don't see any reason to treat this asymptomatic urinary bacterial c/s report.  She was feeling much better and was D/C home in stable condition. Care management has set up [place] services for her.  She was agreeableto discharge and having nursing follow for cardiac status, physical therapy for ambulation. Patient does have access to a cane and 2 walkers. She has trouble with personal care and informed aide for personal care could see as long as a skilled discipline is following. Patient says she is in need for some in home services and wonders if she has any benefits through the [place] "like my husband did." Added social work to referral. Patient daughter lives with her "but she is young and not home a lot." Patient says she is able to ambulate  and get her meals. Otained order for home health OT per OT recommendation. DISCHARGE CONDITIONS:   stable CONSULTS OBTAINED:  Treatment Team:  Wellington Hampshire, MD  DRUG ALLERGIES:   Allergies  Allergen Reactions  . Benadryl [Diphenhydramine Hcl] Hives  . Lisinopril Cough  . Percocet [Oxycodone-Acetaminophen] Other (See Comments)    Reaction:  Dizziness   . Sulfa Antibiotics Hives    DISCHARGE MEDICATIONS:   Discharge Medication List as of 08/01/2015  1:12 PM    START taking these medications   Details  diltiazem  (CARDIZEM CD) 120 MG 24 hr capsule Take 1 capsule (120 mg total) by mouth daily., Starting 08/01/2015, Until Discontinued, Normal    losartan (COZAAR) 50 MG tablet Take 1 tablet (50 mg total) by mouth daily., Starting 08/01/2015, Until Discontinued, Normal      CONTINUE these medications which have CHANGED   Details  amiodarone (PACERONE) 400 MG tablet Take 1 tablet (400 mg total) by mouth 2 (two) times daily with a meal., Starting 08/01/2015, Until Discontinued, Normal      CONTINUE these medications which have NOT CHANGED   Details  albuterol (PROVENTIL HFA;VENTOLIN HFA) 108 (90 BASE) MCG/ACT inhaler Inhale 2 puffs into the lungs every 6 (six) hours as needed for wheezing or shortness of breath., Until Discontinued, Historical Med    LORazepam (ATIVAN) 0.5 MG tablet Take 0.5 mg by mouth every 8 (eight) hours as needed for anxiety., Until Discontinued, Historical Med    pantoprazole (PROTONIX) 40 MG tablet Take 40 mg by mouth 2 (two) times daily., Until Discontinued, Historical Med    predniSONE (DELTASONE) 10 MG tablet Take 10 mg by mouth daily., Until Discontinued, Historical Med    traMADol (ULTRAM) 50 MG tablet Take 50 mg by mouth every 6 (six) hours as needed for moderate pain. , Until Discontinued, Historical Med    furosemide (LASIX) 20 MG tablet Take 1 tablet (20 mg total) by mouth 2 (two) times daily as needed for edema., Starting 02/15/2015, Until Discontinued, Normal      STOP taking these medications     metoprolol succinate (TOPROL-XL) 50 MG 24 hr tablet      metoprolol succinate (TOPROL-XL) 50 MG 24 hr tablet      ondansetron (ZOFRAN ODT) 4 MG disintegrating tablet        DISCHARGE INSTRUCTIONS:   DIET:  Cardiac diet  DISCHARGE CONDITION:  Good  ACTIVITY:  Activity as tolerated  OXYGEN:  Home Oxygen: No.   Oxygen Delivery: room air  DISCHARGE LOCATION:  home   If you experience worsening of your admission symptoms, develop shortness of breath, life  threatening emergency, suicidal or homicidal thoughts you must seek medical attention immediately by calling 911 or calling your MD immediately  if symptoms less severe.  You Must read complete instructions/literature along with all the possible adverse reactions/side effects for all the Medicines you take and that have been prescribed to you. Take any new Medicines after you have completely understood and accpet all the possible adverse reactions/side effects.   Please note  You were cared for by a hospitalist during your hospital stay. If you have any questions about your discharge medications or the care you received while you were in the hospital after you are discharged, you can call the unit and asked to speak with the hospitalist on call if the hospitalist that took care of you is not available. Once you are discharged, your primary care physician will handle any further medical issues. Please note  that NO REFILLS for any discharge medications will be authorized once you are discharged, as it is imperative that you return to your primary care physician (or establish a relationship with a primary care physician if you do not have one) for your aftercare needs so that they can reassess your need for medications and monitor your lab values.    On the day of Discharge: VITAL SIGNS:  Blood pressure 139/81, pulse 76, temperature 97.6 F (36.4 C), temperature source Oral, resp. rate 19, height '5\' 4"'$  (1.626 m), weight 88.587 kg (195 lb 4.8 oz), SpO2 97 %. PHYSICAL EXAMINATION:  GENERAL:  79 y.o.-year-old patient lying in the bed with no acute distress.  EYES: Pupils equal, round, reactive to light and accommodation. No scleral icterus. Extraocular muscles intact.  HEENT: Head atraumatic, normocephalic. Oropharynx and nasopharynx clear.  NECK:  Supple, no jugular venous distention. No thyroid enlargement, no tenderness.  LUNGS: Normal breath sounds bilaterally, no wheezing, rales,rhonchi or  crepitation. No use of accessory muscles of respiration.  CARDIOVASCULAR: S1, S2 normal. No murmurs, rubs, or gallops.  ABDOMEN: Soft, non-tender, non-distended. Bowel sounds present. No organomegaly or mass.  EXTREMITIES: No pedal edema, cyanosis, or clubbing.  NEUROLOGIC: Cranial nerves II through XII are intact. Muscle strength 5/5 in all extremities. Sensation intact. Gait not checked.  PSYCHIATRIC: The patient is alert and oriented x 3.  SKIN: No obvious rash, lesion, or ulcer.  DATA REVIEW:   CBC  Recent Labs Lab 08/01/15 0420  WBC 8.7  HGB 11.8*  HCT 37.1  PLT 229    Chemistries   Recent Labs Lab 07/30/15 1443 07/30/15 1948  08/01/15 0420  NA 141  --   < > 138  K 3.2*  --   < > 4.3  CL 106  --   < > 107  CO2 28  --   < > 24  GLUCOSE 118*  --   < > 98  BUN 19  --   < > 11  CREATININE 1.00  --   < > 0.99  CALCIUM 8.9  --   < > 9.2  MG 2.1 2.2  --   --   AST 30  --   --   --   ALT 17  --   --   --   ALKPHOS 78  --   --   --   BILITOT 0.3  --   --   --   < > = values in this interval not displayed.  Cardiac Enzymes  Recent Labs Lab 07/30/15 1443  TROPONINI <0.03    Microbiology Results  Results for orders placed or performed during the hospital encounter of 12/28/14  Urine culture     Status: None   Collection Time: 12/28/14 11:43 AM  Result Value Ref Range Status   Micro Text Report   Final       SOURCE: CLEAN CATCH    ORGANISM 1                >100,000 CFU/ML Escherichia coli   COMMENT                   -   ANTIBIOTIC                    ORG#1    ORG#2     AMPICILLIN                    R  R         CEFAZOLIN                     S        S         CEFOXITIN                     S        S         CEFTRIAXONE                   S        S         CIPROFLOXACIN                 S        S         GENTAMICIN                    S        S         IMIPENEM                      S        S         LEVOFLOXACIN                  S        S          NITROFURANTOIN                S        S         Trimethoprim/Sulfamethoxazole S        S             RADIOLOGY:  No results found.   Management plans discussed with the patient, family and they are in agreement.  CODE STATUS: Full Code  TOTAL TIME TAKING CARE OF THIS PATIENT: 55 minutes.    [facility], Kemberly Taves M.D on 08/03/2015 at 9:20 AM  Between 7am to 6pm - Pager - [phone]  After 6pm go to [email or web address] - password [email or web address] [facility]  Office  [phone]  CC: Primary care physician; [facility] PRIMARY CARE Dr Juanell Fairly Minna Merritts, MD

## 2015-08-22 ENCOUNTER — Ambulatory Visit: Payer: Medicare Other | Attending: Family | Admitting: Family

## 2015-08-22 ENCOUNTER — Encounter: Payer: Self-pay | Admitting: Family

## 2015-08-22 VITALS — BP 156/82 | HR 91 | Resp 18 | Ht 71.0 in | Wt 192.0 lb

## 2015-08-22 DIAGNOSIS — I10 Essential (primary) hypertension: Secondary | ICD-10-CM

## 2015-08-22 DIAGNOSIS — K219 Gastro-esophageal reflux disease without esophagitis: Secondary | ICD-10-CM | POA: Insufficient documentation

## 2015-08-22 DIAGNOSIS — Z79899 Other long term (current) drug therapy: Secondary | ICD-10-CM | POA: Insufficient documentation

## 2015-08-22 DIAGNOSIS — I471 Supraventricular tachycardia: Secondary | ICD-10-CM | POA: Diagnosis not present

## 2015-08-22 DIAGNOSIS — F419 Anxiety disorder, unspecified: Secondary | ICD-10-CM | POA: Insufficient documentation

## 2015-08-22 DIAGNOSIS — F17200 Nicotine dependence, unspecified, uncomplicated: Secondary | ICD-10-CM

## 2015-08-22 DIAGNOSIS — M069 Rheumatoid arthritis, unspecified: Secondary | ICD-10-CM | POA: Diagnosis not present

## 2015-08-22 DIAGNOSIS — I5042 Chronic combined systolic (congestive) and diastolic (congestive) heart failure: Secondary | ICD-10-CM | POA: Diagnosis present

## 2015-08-22 DIAGNOSIS — E785 Hyperlipidemia, unspecified: Secondary | ICD-10-CM | POA: Diagnosis not present

## 2015-08-22 DIAGNOSIS — J449 Chronic obstructive pulmonary disease, unspecified: Secondary | ICD-10-CM | POA: Diagnosis not present

## 2015-08-22 DIAGNOSIS — I5032 Chronic diastolic (congestive) heart failure: Secondary | ICD-10-CM

## 2015-08-22 DIAGNOSIS — Z7982 Long term (current) use of aspirin: Secondary | ICD-10-CM | POA: Insufficient documentation

## 2015-08-22 DIAGNOSIS — F17210 Nicotine dependence, cigarettes, uncomplicated: Secondary | ICD-10-CM | POA: Diagnosis not present

## 2015-08-22 MED ORDER — DILTIAZEM HCL ER COATED BEADS 120 MG PO CP24: 120.0000 mg | ORAL_CAPSULE | Freq: Every day | ORAL | Status: DC

## 2015-08-22 MED ORDER — LOSARTAN POTASSIUM 50 MG PO TABS: 50.0000 mg | ORAL_TABLET | Freq: Every day | ORAL | Status: DC

## 2015-08-22 NOTE — Patient Instructions (Signed)
Begin weighing daily and call for an overnight weight gain of > 2 pounds or a weekly weight gain of >5 pounds. 

## 2015-08-22 NOTE — Progress Notes (Signed)
Subjective:    Patient ID: [name] [name], female    DOB: [birth year], 79 y.o.   MRN: [id number]  Congestive Heart Failure Presents for initial visit. The disease course has been improving. Associated symptoms include fatigue and shortness of breath (when walking long distances). Pertinent negatives include no abdominal pain, chest pain, edema, orthopnea, palpitations or paroxysmal nocturnal dyspnea. The symptoms have been improving. Past treatments include angiotensin receptor blockers, beta blockers and salt and fluid restriction. The treatment provided moderate relief. Her past medical history is significant for arrhythmia, chronic lung disease and HTN. Compliance with total regimen is 76-100%.  Hypertension This is a chronic problem. The current episode started more than 1 year ago. The problem has been gradually improving since onset. Associated symptoms include shortness of breath (when walking long distances). Pertinent negatives include no chest pain, headaches, neck pain, palpitations, peripheral edema or PND. Agents associated with hypertension include steroids. Risk factors for coronary artery disease include family history, post-menopausal state and smoking/tobacco exposure. Past treatments include beta blockers, diuretics, lifestyle changes and angiotensin blockers. The current treatment provides mild improvement. Compliance problems include medication cost.  Hypertensive end-organ damage includes kidney disease and heart failure.    Past Medical History  Diagnosis Date  . Hypertension   . Hyperlipidemia   . COPD (chronic obstructive pulmonary disease) ([place])     a. With ongoing tobacco use.  Marland Kitchen GERD (gastroesophageal reflux disease)   . Anxiety   . Rheumatoid arthritis ([place])   . Osteoarthritis   . Hyperuricemia   . Chronic combined systolic and diastolic CHF, NYHA class 1 ([place])     a. 12/2013 Echo: EF 30-35%, DD, mid-distal anterior wall, apical and mid-apical  inferior/posterior WMAs, mild MR/TR, normal RVSP.   Marland Kitchen Renal insufficiency   . Thyroid nodule   . Lung nodule   . Tobacco abuse   . S/P cardiac cath     a. 2008 Cath: nl cors.  . SVT (supraventricular tachycardia) ([place])     a. 12/2013 & 01/2014-->broke with adenosine.  . Pulmonary nodule, left     a.  H/O 1.7 cm nodule anterior medial base of the left lower lobe, not seen on 12/2013 CX[facility]).  . Collagen vascular disease [facility])     Past Surgical History  Procedure Laterality Date  . Bowel obstruction    . Tubal ligation    . Cardiac catheterization  2008    [facility]    Family History  Problem Relation Age of Onset  . Coronary artery disease Neg Hx     Early  . Stroke Neg Hx     CVA  . Hypertension Other   . Diabetes Other   . Hypertension Mother    Social History  Substance Use Topics  . Smoking status: Current Some Day Smoker -- 0.25 packs/day for 50 years    Types: Cigarettes  . Smokeless tobacco: Not on file  . Alcohol Use: No    Allergies  Allergen Reactions  . Benadryl [Diphenhydramine Hcl] Hives  . Lisinopril Cough  . Percocet [Oxycodone-Acetaminophen] Other (See Comments)    Reaction:  Dizziness   . Sulfa Antibiotics Hives    Prior to Admission medications   Medication Sig Start Date End Date Taking? Authorizing Provider  amiodarone (PACERONE) 400 MG tablet Take 1 tablet (400 mg total) by mouth 2 (two) times daily with a meal. 08/01/15  Yes Max Sane, MD  aspirin 81 MG tablet Take 81 mg by mouth daily.   Yes  Historical Provider, MD  Cholecalciferol (VITAMIN D3) 5000 UNITS CAPS Take 1 capsule by mouth daily.   Yes Historical Provider, MD  diltiazem (CARDIZEM CD) 120 MG 24 hr capsule Take 1 capsule (120 mg total) by mouth daily. 08/22/15  Yes Alisa Graff, FNP  furosemide (LASIX) 20 MG tablet Take 1 tablet (20 mg total) by mouth 2 (two) times daily as needed for edema. 02/15/15  Yes Minna Merritts, MD  LORazepam (ATIVAN) 0.5 MG tablet Take 0.5 mg by mouth  every 8 (eight) hours as needed for anxiety.   Yes Historical Provider, MD  losartan (COZAAR) 50 MG tablet Take 1 tablet (50 mg total) by mouth daily. 08/22/15  Yes Alisa Graff, FNP  ondansetron (ZOFRAN) 4 MG tablet Take 4 mg by mouth every 8 (eight) hours as needed for nausea or vomiting.   Yes Historical Provider, MD  pantoprazole (PROTONIX) 40 MG tablet Take 40 mg by mouth 2 (two) times daily.   Yes Historical Provider, MD  predniSONE (DELTASONE) 10 MG tablet Take 10 mg by mouth daily.   Yes Historical Provider, MD  traMADol (ULTRAM) 50 MG tablet Take 50 mg by mouth every 6 (six) hours as needed for moderate pain.    Yes Historical Provider, MD     Review of Systems  Constitutional: Positive for fatigue. Negative for appetite change.  HENT: Negative for congestion, postnasal drip and sore throat.   Eyes: Negative.   Respiratory: Positive for shortness of breath (when walking long distances). Negative for cough, chest tightness and wheezing.   Cardiovascular: Negative for chest pain, palpitations, leg swelling and PND.  Gastrointestinal: Negative for abdominal pain and abdominal distention.  Endocrine: Negative.   Genitourinary: Negative.   Musculoskeletal: Positive for arthralgias (both knees). Negative for back pain and neck pain.  Skin: Negative.   Allergic/Immunologic: Negative.   Neurological: Positive for dizziness (when bending over at the waist). Negative for light-headedness and headaches.  Hematological: Negative for adenopathy. Does not bruise/bleed easily.  Psychiatric/Behavioral: Positive for dysphoric mood (at times). Negative for sleep disturbance (sleeping on 2 pillows). The patient is not nervous/anxious.        Objective:   Physical Exam  Constitutional: She is oriented to person, place, and time. She appears well-developed and well-nourished.  HENT:  Head: Normocephalic and atraumatic.  Eyes: Conjunctivae are normal. Pupils are equal, round, and reactive to  light.  Neck: Normal range of motion. Neck supple.  Cardiovascular: Regular rhythm.  Tachycardia present.   Pulmonary/Chest: Effort normal. She has no wheezes. She has no rales.  Abdominal: Soft. She exhibits no distension. There is no tenderness.  Musculoskeletal: She exhibits no edema or tenderness.  Neurological: She is alert and oriented to person, place, and time.  Skin: Skin is warm and dry.  Psychiatric: She has a normal mood and affect. Her behavior is normal. Thought content normal.  Nursing note and vitals reviewed.   BP 156/82 mmHg  Pulse 91  Resp 18  Ht '5\' 11"'$  (1.803 m)  Wt 192 lb (87.091 kg)  BMI 26.79 kg/m2  SpO2 100%       Assessment & Plan:  1: Chronic heart failure with preserved ejection fraction- Patient presents with some fatigue and shortness of breath upon exertion. She says that when she does experience symptoms, she will stop and sit down to rest for a few minutes until her symptoms improve. She says that she was a little tired upon walking into the office today. She is not weighing  herself because she doesn't have any scales so a set was given to her. Instructed her to weigh herself first thing in the morning after using the bathroom and to call for an overnight weight gain of >2 pounds or a weekly weight gain of >5 pounds. She does not add salt to her food and is already reading food labels. Discussed the importance of following a '2000mg'$  sodium diet and written information was given to her as well about her diet. She isn't sure if she's been on the losartan since discharge but she's changing pharmacies so a new prescription was sent in for that. Will need to check a chemistry panel at her next visit if not done at her PCP's office on 09/08/15. She does not take the flu vaccine.  2: HTN- Blood pressure mildly elevated but was lower than initial reading. Adding cozaar per above and could also possibly increase her diltiazem if needed.  3: SVT- Heart rate in the 90's  today. Is taking amiodarone and diltiazem. Has an appointment with cardiology on 09/27/15. 4: Tobacco use- Patient says that 1 pack of cigarettes will last her 2-3 days and her goal is to stop smoking completely. Cessation discussed for 3 minutes with her.  Return in 1 month or sooner for any questions/problems before then.

## 2015-09-26 ENCOUNTER — Ambulatory Visit: Payer: Medicare Other | Attending: Family | Admitting: Family

## 2015-09-26 ENCOUNTER — Encounter: Payer: Self-pay | Admitting: Family

## 2015-09-26 VITALS — BP 128/74 | HR 97 | Resp 20 | Ht 71.0 in | Wt 189.0 lb

## 2015-09-26 DIAGNOSIS — Z79899 Other long term (current) drug therapy: Secondary | ICD-10-CM | POA: Insufficient documentation

## 2015-09-26 DIAGNOSIS — Z9889 Other specified postprocedural states: Secondary | ICD-10-CM | POA: Insufficient documentation

## 2015-09-26 DIAGNOSIS — E041 Nontoxic single thyroid nodule: Secondary | ICD-10-CM | POA: Insufficient documentation

## 2015-09-26 DIAGNOSIS — Z7982 Long term (current) use of aspirin: Secondary | ICD-10-CM | POA: Insufficient documentation

## 2015-09-26 DIAGNOSIS — I10 Essential (primary) hypertension: Secondary | ICD-10-CM

## 2015-09-26 DIAGNOSIS — R110 Nausea: Secondary | ICD-10-CM | POA: Diagnosis not present

## 2015-09-26 DIAGNOSIS — R911 Solitary pulmonary nodule: Secondary | ICD-10-CM | POA: Insufficient documentation

## 2015-09-26 DIAGNOSIS — E785 Hyperlipidemia, unspecified: Secondary | ICD-10-CM | POA: Insufficient documentation

## 2015-09-26 DIAGNOSIS — I130 Hypertensive heart and chronic kidney disease with heart failure and stage 1 through stage 4 chronic kidney disease, or unspecified chronic kidney disease: Secondary | ICD-10-CM | POA: Insufficient documentation

## 2015-09-26 DIAGNOSIS — I5032 Chronic diastolic (congestive) heart failure: Secondary | ICD-10-CM

## 2015-09-26 DIAGNOSIS — J449 Chronic obstructive pulmonary disease, unspecified: Secondary | ICD-10-CM | POA: Diagnosis not present

## 2015-09-26 DIAGNOSIS — F419 Anxiety disorder, unspecified: Secondary | ICD-10-CM | POA: Diagnosis not present

## 2015-09-26 DIAGNOSIS — K219 Gastro-esophageal reflux disease without esophagitis: Secondary | ICD-10-CM | POA: Diagnosis not present

## 2015-09-26 DIAGNOSIS — M1990 Unspecified osteoarthritis, unspecified site: Secondary | ICD-10-CM | POA: Insufficient documentation

## 2015-09-26 DIAGNOSIS — I471 Supraventricular tachycardia: Secondary | ICD-10-CM | POA: Insufficient documentation

## 2015-09-26 DIAGNOSIS — Z888 Allergy status to other drugs, medicaments and biological substances status: Secondary | ICD-10-CM | POA: Diagnosis not present

## 2015-09-26 DIAGNOSIS — F17210 Nicotine dependence, cigarettes, uncomplicated: Secondary | ICD-10-CM | POA: Insufficient documentation

## 2015-09-26 DIAGNOSIS — M069 Rheumatoid arthritis, unspecified: Secondary | ICD-10-CM | POA: Insufficient documentation

## 2015-09-26 DIAGNOSIS — F17200 Nicotine dependence, unspecified, uncomplicated: Secondary | ICD-10-CM

## 2015-09-26 DIAGNOSIS — I5042 Chronic combined systolic (congestive) and diastolic (congestive) heart failure: Secondary | ICD-10-CM | POA: Insufficient documentation

## 2015-09-26 DIAGNOSIS — R000 Tachycardia, unspecified: Secondary | ICD-10-CM

## 2015-09-26 DIAGNOSIS — I998 Other disorder of circulatory system: Secondary | ICD-10-CM | POA: Diagnosis not present

## 2015-09-26 NOTE — Progress Notes (Signed)
Subjective:    Patient ID: [name] [name], female    DOB: 1935-10-29, 80 y.o.   MRN: [id number]  Congestive Heart Failure Presents for follow-up visit. The disease course has been stable. Associated symptoms include fatigue and shortness of breath (better). Pertinent negatives include no abdominal pain, chest pain, edema, orthopnea or palpitations. The symptoms have been stable. Past treatments include salt and fluid restriction. The treatment provided moderate relief. Compliance with prior treatments has been good. Her past medical history is significant for chronic lung disease and HTN.  Hypertension This is a chronic problem. The current episode started more than 1 year ago. The problem is unchanged. The problem is controlled. Associated symptoms include shortness of breath (better). Pertinent negatives include no chest pain, headaches, palpitations or peripheral edema. Agents associated with hypertension include steroids. Risk factors for coronary artery disease include post-menopausal state, smoking/tobacco exposure, dyslipidemia and family history. Past treatments include angiotensin blockers, calcium channel blockers, beta blockers, diuretics and lifestyle changes. The current treatment provides moderate improvement. Hypertensive end-organ damage includes heart failure. There is no history of CVA.  Other This is a chronic (knee pain) problem. The current episode started more than 1 year ago. The problem occurs constantly. The problem has been gradually worsening. Associated symptoms include arthralgias (both knees with L worse than R), fatigue and nausea (with generic diliazem). Pertinent negatives include no abdominal pain, chest pain, congestion, coughing, headaches, numbness, sore throat or weakness. The symptoms are aggravated by standing and walking. She has tried position changes for the symptoms. The treatment provided no relief.   Past Medical History  Diagnosis Date  .  Hypertension   . Hyperlipidemia   . COPD (chronic obstructive pulmonary disease) ([place])     a. With ongoing tobacco use.  Marland Kitchen GERD (gastroesophageal reflux disease)   . Anxiety   . Rheumatoid arthritis ([place])   . Osteoarthritis   . Hyperuricemia   . Chronic combined systolic and diastolic CHF, NYHA class 1 ([place])     a. 12/2013 Echo: EF 30-35%, DD, mid-distal anterior wall, apical and mid-apical inferior/posterior WMAs, mild MR/TR, normal RVSP.   Marland Kitchen Renal insufficiency   . Thyroid nodule   . Lung nodule   . Tobacco abuse   . S/P cardiac cath     a. 2008 Cath: nl cors.  . SVT (supraventricular tachycardia) ([place])     a. 12/2013 & 01/2014-->broke with adenosine.  . Pulmonary nodule, left     a.  H/O 1.7 cm nodule anterior medial base of the left lower lobe, not seen on 12/2013 CXR [facility]).  . Collagen vascular disease [facility])     Past Surgical History  Procedure Laterality Date  . Bowel obstruction    . Tubal ligation    . Cardiac catheterization  2008    [facility]    Family History  Problem Relation Age of Onset  . Coronary artery disease Neg Hx     Early  . Stroke Neg Hx     CVA  . Hypertension Other   . Diabetes Other   . Hypertension Mother     Social History  Substance Use Topics  . Smoking status: Current Some Day Smoker -- 0.25 packs/day for 50 years    Types: Cigarettes  . Smokeless tobacco: Never Used  . Alcohol Use: No    Allergies  Allergen Reactions  . Benadryl [Diphenhydramine Hcl] Hives  . Lisinopril Cough  . Percocet [Oxycodone-Acetaminophen] Other (See Comments)    Reaction:  Dizziness   .  Sulfa Antibiotics Hives    Prior to Admission medications   Medication Sig Start Date End Date Taking? Authorizing Provider  amiodarone (PACERONE) 200 MG tablet Take 200 mg by mouth 2 (two) times daily.   Yes Historical Provider, MD  aspirin 81 MG tablet Take 81 mg by mouth daily.   Yes Historical Provider, MD  diltiazem (CARDIZEM CD) 120 MG 24 hr capsule Take 1 capsule  (120 mg total) by mouth daily. 08/22/15  Yes Alisa Graff, FNP  furosemide (LASIX) 20 MG tablet Take 1 tablet (20 mg total) by mouth 2 (two) times daily as needed for edema. 02/15/15  Yes Minna Merritts, MD  LORazepam (ATIVAN) 0.5 MG tablet Take 0.5 mg by mouth every 8 (eight) hours as needed for anxiety.   Yes Historical Provider, MD  mirtazapine (REMERON) 15 MG tablet Take 15 mg by mouth at bedtime.   Yes Historical Provider, MD  ondansetron (ZOFRAN) 4 MG tablet Take 4 mg by mouth every 8 (eight) hours as needed for nausea or vomiting.   Yes Historical Provider, MD  pantoprazole (PROTONIX) 40 MG tablet Take 40 mg by mouth 2 (two) times daily.   Yes Historical Provider, MD  predniSONE (DELTASONE) 10 MG tablet Take 10 mg by mouth daily.   Yes Historical Provider, MD  traMADol (ULTRAM) 50 MG tablet Take 50 mg by mouth every 6 (six) hours as needed for moderate pain.    Yes Historical Provider, MD      Review of Systems  Constitutional: Positive for appetite change and fatigue.  HENT: Negative for congestion, postnasal drip and sore throat.   Eyes: Negative.   Respiratory: Positive for shortness of breath (better). Negative for cough and chest tightness.   Cardiovascular: Negative for chest pain, palpitations and leg swelling.  Gastrointestinal: Positive for nausea (with generic diliazem). Negative for abdominal pain and abdominal distention.  Endocrine: Negative.   Genitourinary: Negative.   Musculoskeletal: Positive for arthralgias (both knees with L worse than R). Negative for back pain.  Skin: Negative.   Allergic/Immunologic: Negative.   Neurological: Negative for dizziness, weakness, light-headedness, numbness and headaches.  Hematological: Negative for adenopathy. Does not bruise/bleed easily.  Psychiatric/Behavioral: Negative for sleep disturbance (sleeping on 2 pillows) and dysphoric mood. The patient is not nervous/anxious.        Objective:   Physical Exam  Constitutional:  She is oriented to person, place, and time. She appears well-developed and well-nourished.  HENT:  Head: Normocephalic and atraumatic.  Eyes: Conjunctivae are normal. Pupils are equal, round, and reactive to light.  Neck: Normal range of motion. Neck supple.  Cardiovascular: Regular rhythm.  Tachycardia present.   Pulmonary/Chest: Effort normal. She has no wheezes. She has no rales.  Abdominal: Soft. She exhibits no distension. There is no tenderness.  Musculoskeletal: She exhibits no edema or tenderness.  Neurological: She is alert and oriented to person, place, and time.  Skin: Skin is warm and dry.  Psychiatric: She has a normal mood and affect. Her behavior is normal. Thought content normal.  Nursing note and vitals reviewed.   BP 128/74 mmHg  Pulse 97  Resp 20  Ht '5\' 11"'$  (1.803 m)  Wt 189 lb (85.73 kg)  BMI 26.37 kg/m2  SpO2 96%       Assessment & Plan:  1: Chronic heart failure with preserved ejection fraction- Patient presents with continued fatigue but with improving shortness of breath. Denies any swelling in her abdomen or her lower legs. Continues to weigh herself  daily and reports a stable weight. By our scale, she's lost 3 pounds in the last month. She is not adding any salt to her food and tries to follow a low sodium diet. Re-started her losartan at her last visit but she is not taking it as she says that her PCP told her to not take it. [facility] went in and reviewed medications with her. She is taking her furosemide twice daily. Was going to check labs today but since she's not taking losartan, will not draw any labs. 2: HTN- Blood pressure looks good today. 3: Tachycardia- Heart rate remains in the 90's. Has had her amiodarone reduced to '200mg'$  twice daily and she continues to take diltiazem '120mg'$  daily. Considered increasing her cardizem but she would prefer her PCP handle her medications. Would recommend getting TSH, LFT, chemistry panel and chest xray since she is  taking amiodarone. She sees cardiology tomorrow and her PCP this month. 4: Tobacco use- She continues to smoke but says that she's "almost stopped". Complete cessation discussed for 3 minutes with her. 5: Nausea- She says that in the last month, she's felt nauseous and she feels like it's due to the diltiazem. She says that the capsule she used to take was green/white and this last month it was red/white. Explained that the colors can be different depending on what company the pharmacy gets it from. Could change her to name brand but explained that it might cost more. She wants to continue the generic for now and see how she does next month when she gets it refilled.  Return here in 3 months or sooner for any questions/problems before then.

## 2015-09-26 NOTE — Patient Instructions (Signed)
Continue weighing daily and call for an overnight weight gain of > 2 pounds or a weekly weight gain of >5 pounds.    Smoking Cessation Quitting smoking is important to your health and has many advantages. However, it is not always easy to quit since nicotine is a very addictive drug. Oftentimes, people try 3 times or more before being able to quit. This document explains the best ways for you to prepare to quit smoking. Quitting takes hard work and a lot of effort, but you can do it. ADVANTAGES OF QUITTING SMOKING  You will live longer, feel better, and live better.  Your body will feel the impact of quitting smoking almost immediately.  Within 20 minutes, blood pressure decreases. Your pulse returns to its normal level.  After 8 hours, carbon monoxide levels in the blood return to normal. Your oxygen level increases.  After 24 hours, the chance of having a heart attack starts to decrease. Your breath, hair, and body stop smelling like smoke.  After 48 hours, damaged nerve endings begin to recover. Your sense of taste and smell improve.  After 72 hours, the body is virtually free of nicotine. Your bronchial tubes relax and breathing becomes easier.  After 2 to 12 weeks, lungs can hold more air. Exercise becomes easier and circulation improves.  The risk of having a heart attack, stroke, cancer, or lung disease is greatly reduced.  After 1 year, the risk of coronary heart disease is cut in half.  After 5 years, the risk of stroke falls to the same as a nonsmoker.  After 10 years, the risk of lung cancer is cut in half and the risk of other cancers decreases significantly.  After 15 years, the risk of coronary heart disease drops, usually to the level of a nonsmoker.  If you are pregnant, quitting smoking will improve your chances of having a healthy baby.  The people you live with, especially any children, will be healthier.  You will have extra money to spend on things other  than cigarettes. QUESTIONS TO THINK ABOUT BEFORE ATTEMPTING TO QUIT You may want to talk about your answers with your health care provider.  Why do you want to quit?  If you tried to quit in the past, what helped and what did not?  What will be the most difficult situations for you after you quit? How will you plan to handle them?  Who can help you through the tough times? Your family? Friends? A health care provider?  What pleasures do you get from smoking? What ways can you still get pleasure if you quit? Here are some questions to ask your health care provider:  How can you help me to be successful at quitting?  What medicine do you think would be best for me and how should I take it?  What should I do if I need more help?  What is smoking withdrawal like? How can I get information on withdrawal? GET READY  Set a quit date.  Change your environment by getting rid of all cigarettes, ashtrays, matches, and lighters in your home, car, or work. Do not let people smoke in your home.  Review your past attempts to quit. Think about what worked and what did not. GET SUPPORT AND ENCOURAGEMENT You have a better chance of being successful if you have help. You can get support in many ways.  Tell your family, friends, and coworkers that you are going to quit and need their support. Ask   them not to smoke around you.  Get individual, group, or telephone counseling and support. Programs are available at local hospitals and health centers. Call your local health department for information about programs in your area.  Spiritual beliefs and practices may help some smokers quit.  Download a "quit meter" on your computer to keep track of quit statistics, such as how long you have gone without smoking, cigarettes not smoked, and money saved.  Get a self-help book about quitting smoking and staying off tobacco. LEARN NEW SKILLS AND BEHAVIORS  Distract yourself from urges to smoke. Talk to  someone, go for a walk, or occupy your time with a task.  Change your normal routine. Take a different route to work. Drink tea instead of coffee. Eat breakfast in a different place.  Reduce your stress. Take a hot bath, exercise, or read a book.  Plan something enjoyable to do every day. Reward yourself for not smoking.  Explore interactive web-based programs that specialize in helping you quit. GET MEDICINE AND USE IT CORRECTLY Medicines can help you stop smoking and decrease the urge to smoke. Combining medicine with the above behavioral methods and support can greatly increase your chances of successfully quitting smoking.  Nicotine replacement therapy helps deliver nicotine to your body without the negative effects and risks of smoking. Nicotine replacement therapy includes nicotine gum, lozenges, inhalers, nasal sprays, and skin patches. Some may be available over-the-counter and others require a prescription.  Antidepressant medicine helps people abstain from smoking, but how this works is unknown. This medicine is available by prescription.  Nicotinic receptor partial agonist medicine simulates the effect of nicotine in your brain. This medicine is available by prescription. Ask your health care provider for advice about which medicines to use and how to use them based on your health history. Your health care provider will tell you what side effects to look out for if you choose to be on a medicine or therapy. Carefully read the information on the package. Do not use any other product containing nicotine while using a nicotine replacement product.  RELAPSE OR DIFFICULT SITUATIONS Most relapses occur within the first 3 months after quitting. Do not be discouraged if you start smoking again. Remember, most people try several times before finally quitting. You may have symptoms of withdrawal because your body is used to nicotine. You may crave cigarettes, be irritable, feel very hungry, cough  often, get headaches, or have difficulty concentrating. The withdrawal symptoms are only temporary. They are strongest when you first quit, but they will go away within 10-14 days. To reduce the chances of relapse, try to:  Avoid drinking alcohol. Drinking lowers your chances of successfully quitting.  Reduce the amount of caffeine you consume. Once you quit smoking, the amount of caffeine in your body increases and can give you symptoms, such as a rapid heartbeat, sweating, and anxiety.  Avoid smokers because they can make you want to smoke.  Do not let weight gain distract you. Many smokers will gain weight when they quit, usually less than 10 pounds. Eat a healthy diet and stay active. You can always lose the weight gained after you quit.  Find ways to improve your mood other than smoking. FOR MORE INFORMATION  www.smokefree.gov  Document Released: 09/02/2001 Document Revised: 01/23/2014 Document Reviewed: 12/18/2011 [employer] Patient Information 2015 [employer]. This information is not intended to replace advice given to you by your health care provider. Make sure you discuss any questions you have with your   health care provider.  

## 2015-09-27 ENCOUNTER — Ambulatory Visit (INDEPENDENT_AMBULATORY_CARE_PROVIDER_SITE_OTHER): Payer: Medicare Other | Admitting: Nurse Practitioner

## 2015-09-27 ENCOUNTER — Encounter: Payer: Self-pay | Admitting: Nurse Practitioner

## 2015-09-27 VITALS — BP 138/82 | HR 86 | Ht 71.0 in | Wt 192.5 lb

## 2015-09-27 DIAGNOSIS — I110 Hypertensive heart disease with heart failure: Secondary | ICD-10-CM | POA: Diagnosis not present

## 2015-09-27 DIAGNOSIS — I5042 Chronic combined systolic (congestive) and diastolic (congestive) heart failure: Secondary | ICD-10-CM

## 2015-09-27 DIAGNOSIS — I471 Supraventricular tachycardia: Secondary | ICD-10-CM | POA: Diagnosis not present

## 2015-09-27 DIAGNOSIS — R110 Nausea: Secondary | ICD-10-CM | POA: Diagnosis not present

## 2015-09-27 DIAGNOSIS — I119 Hypertensive heart disease without heart failure: Secondary | ICD-10-CM | POA: Insufficient documentation

## 2015-09-27 NOTE — Patient Instructions (Signed)
Medication Instructions:  Your physician recommends that you continue on your current medications as directed. Please refer to the Current Medication list given to you today.   Labwork: none  Testing/Procedures: none  Follow-Up: Your physician recommends that you schedule a follow-up appointment in: one month with Dr. Rockey Situ   Any Other Special Instructions Will Be Listed Below (If Applicable).     If you need a refill on your cardiac medications before your next appointment, please call your pharmacy.

## 2015-09-27 NOTE — Progress Notes (Signed)
Office Visit    Patient Name: [name] [name] Date of Encounter: 09/27/2015  Primary Care Provider:  Juanell Fairly, MD Primary Cardiologist:  Johnny Bridge, MD   Chief Complaint    80 year old female with a history of combined systolic and diastolic heart failure and SVT who presents for follow-up.  Past Medical History    Past Medical History  Diagnosis Date  . Hypertensive heart disease   . Hyperlipidemia   . COPD (chronic obstructive pulmonary disease) ([place])     a. With ongoing tobacco use.  Marland Kitchen GERD (gastroesophageal reflux disease)   . Anxiety   . Rheumatoid arthritis ([place])   . Osteoarthritis   . Hyperuricemia   . Chronic combined systolic and diastolic CHF, NYHA class 1 ([place])     a. 12/2013 Echo: EF 30-35%, DD, mid-distal anterior wall, apical and mid-apical inferior/posterior WMAs, mild MR/TR, normal RVSP;  b. 07/2015 Echo: EF nl, Gr1 DD, mild MR.  . CKD II - III   . Thyroid nodule   . Tobacco abuse   . S/P cardiac cath     a. 2008 Cath: nl cors.  . SVT (supraventricular tachycardia) ([place])     a. 12/2013 & 01/2014-->broke with adenosine;  b. 07/2015 Amio started.  . Pulmonary nodule, left     a.  H/O 1.7 cm nodule anterior medial base of the left lower lobe, not seen on 12/2013 CXR [facility]).  . Collagen vascular disease [facility])    Past Surgical History  Procedure Laterality Date  . Bowel obstruction    . Tubal ligation    . Cardiac catheterization  2008    [facility]    Allergies  Allergies  Allergen Reactions  . Benadryl [Diphenhydramine Hcl] Hives  . Lisinopril Cough  . Percocet [Oxycodone-Acetaminophen] Other (See Comments)    Reaction:  Dizziness   . Sulfa Antibiotics Hives    History of Present Illness    80 year old female with the above complex past medical history. She has a prior history of nonischemic myopathy with normal coronary arteries on catheterization 2008 but with EF of 30-35% by echo in April 2015. Follow-up echo in November 2016 showed normal LV  function. She also has a history of paroxysmal supraventricular tachycardia with intolerance to beta blockers. She was readmitted with SVT in November 2016 and was subsequently placed on amiodarone therapy. This has been used in combination with diltiazem. She says that in late December, she got a new prescription for diltiazem CD and that ever since taking the presumably new formulation (see the color is different than what she was previously taking), she's been experiencing nausea and poor appetite. She has not had any vomiting or change in stool. Further, she denies palpitations, chest pain, dyspnea, PND, orthopnea, dizziness, syncope, edema, or early satiety. She was seen in heart failure clinic yesterday and was offered a new prescription for brand-name only diltiazem however she brought secondary to feeling as though she would probably not be afforded it.   Home Medications    Prior to Admission medications   Medication Sig Start Date End Date Taking? Authorizing Provider  amiodarone (PACERONE) 200 MG tablet Take 200 mg by mouth 2 (two) times daily.   Yes Historical Provider, MD  aspirin 81 MG tablet Take 81 mg by mouth daily.   Yes Historical Provider, MD  diltiazem (CARDIZEM CD) 120 MG 24 hr capsule Take 1 capsule (120 mg total) by mouth daily. 08/22/15  Yes Alisa Graff, FNP  furosemide (LASIX) 20  MG tablet Take 1 tablet (20 mg total) by mouth 2 (two) times daily as needed for edema. 02/15/15  Yes Minna Merritts, MD  LORazepam (ATIVAN) 0.5 MG tablet Take 0.5 mg by mouth every 8 (eight) hours as needed for anxiety.   Yes Historical Provider, MD  mirtazapine (REMERON) 15 MG tablet Take 15 mg by mouth at bedtime.   Yes Historical Provider, MD  ondansetron (ZOFRAN) 4 MG tablet Take 4 mg by mouth every 8 (eight) hours as needed for nausea or vomiting.   Yes Historical Provider, MD  pantoprazole (PROTONIX) 40 MG tablet Take 40 mg by mouth 2 (two) times daily.   Yes Historical Provider, MD    predniSONE (DELTASONE) 10 MG tablet Take 10 mg by mouth daily.   Yes Historical Provider, MD  traMADol (ULTRAM) 50 MG tablet Take 50 mg by mouth every 6 (six) hours as needed for moderate pain.    Yes Historical Provider, MD    Review of Systems    As above, she's been having nausea with poor appetite. She has not been having any vomiting. She denies chest pain, palpitations, dyspnea, pnd, orthopnea, n, v, dizziness, syncope, edema, weight gain, or early satiety.  All other systems reviewed and are otherwise negative except as noted above.  Physical Exam    VS:  BP 138/82 mmHg  Pulse 86  Ht '5\' 11"'$  (1.803 m)  Wt 192 lb 8 oz (87.317 kg)  BMI 26.86 kg/m2 , BMI Body mass index is 26.86 kg/(m^2). GEN: Well nourished, well developed, in no acute distress. HEENT: normal. Neck: Supple, no JVD, carotid bruits, or masses. Cardiac: RRR, no murmurs, rubs, or gallops. No clubbing, cyanosis,  trace bilateral lower extremity edema.  Radials/DP/PT 2+ and equal bilaterally.  Respiratory:  Respirations regular and unlabored, clear to auscultation bilaterally. GI: Soft, nontender, nondistended, BS + x 4. MS: no deformity or atrophy. Skin: warm and dry, no rash. Neuro:  Strength and sensation are intact. Psych: Normal affect.  Accessory Clinical Findings    ECG - regular sinus rhythm, 76, left axis deviation, left anterior fascicular block, poor R-wave progression, LVH IVCD. No acute ST or T changes.  Assessment & Plan    1.  Chronic combined systolic and diastolic congestive heart failure: Patient is been doing well from this standpoint without any significant dyspnea or edema. Her weight has been stable at home. She notes that it is 3 pounds higher on our scale that was on the heart failure scale yesterday but again, it was stable at home this morning. Most recent evaluation of her LV function showed a normal EF in November 2016. She is intolerant to beta blockers. She previously been on ARB therapy  but this was subsequently discontinued. At this time, she remains on diltiazem and Lasix. In the setting of a history of diastolic failure, heart rate could be better managed, however she's been having trouble with diltiazem -causing nausea, and thus am reluctant to increase it as it may need to be discontinued if nausea persists.  2. Nausea: This is her biggest complaint today. It started after switching from one formulation of diltiazem CD to a another in late December. I have offered her a prescription for name brand Cardizem CD, however she is reluctant secondary to cost. I also recommended a diltiazem holiday to see if symptoms improve however she is reluctant to stop because of her history of SVT and also because she just ordered a bottle from the pharmacy and needs to  pick up today, and she doesn't want to go to waste. For the latter reason, she didn't want to give generic short-acting diltiazem a trial. She is taking when necessary Zofran. I advised that if her symptoms do not get better, we will have to hold the diltiazem to see if this is the offending agent. It is notable that she was started on amiodarone in November, and is not uncommon for patients to experience nausea on it.  3. Hypertensive heart disease: Blood pressure is relatively stable on current regimen. If she comes off diltiazem, we will have to add an antihypertensive. She had previously been on losartan but this was discontinued by primary care.   4. Paroxysmal supraventricular tachycardia: She has not had any recurrence of symptoms. She remains on amiodarone and diltiazem as above. Question of amiodarone may be causing nausea. Patient is reluctant to take a drug holiday. As she is now on amiodarone, she will need follow-up LFTs, TFTs, PFTs, and annual eye exams. She says that she recently had blood work with her primary care provider and has follow-up with him later this month and would prefer to wait to see him before having any  more blood work because she may have already had what we wanted checked. I would hold off on getting PFTs until we are sure she is going to continue on amiodarone.   5. Disposition: Follow-up in one month to reevaluate nausea and determine next course of action.   Murray Hodgkins, NP 09/27/2015, 12:23 PM

## 2015-10-04 ENCOUNTER — Encounter: Payer: Self-pay | Admitting: Nurse Practitioner

## 2015-11-14 ENCOUNTER — Ambulatory Visit (INDEPENDENT_AMBULATORY_CARE_PROVIDER_SITE_OTHER): Payer: Medicare Other | Admitting: Cardiovascular Disease

## 2015-11-14 ENCOUNTER — Encounter: Payer: Self-pay | Admitting: Cardiovascular Disease

## 2015-11-14 VITALS — BP 120/70 | HR 94 | Ht 71.0 in | Wt 191.5 lb

## 2015-11-14 DIAGNOSIS — Z720 Tobacco use: Secondary | ICD-10-CM

## 2015-11-14 DIAGNOSIS — E785 Hyperlipidemia, unspecified: Secondary | ICD-10-CM

## 2015-11-14 DIAGNOSIS — I10 Essential (primary) hypertension: Secondary | ICD-10-CM

## 2015-11-14 DIAGNOSIS — F17200 Nicotine dependence, unspecified, uncomplicated: Secondary | ICD-10-CM

## 2015-11-14 DIAGNOSIS — I471 Supraventricular tachycardia: Secondary | ICD-10-CM

## 2015-11-14 DIAGNOSIS — R079 Chest pain, unspecified: Secondary | ICD-10-CM | POA: Diagnosis not present

## 2015-11-14 DIAGNOSIS — I5032 Chronic diastolic (congestive) heart failure: Secondary | ICD-10-CM

## 2015-11-14 DIAGNOSIS — I5042 Chronic combined systolic (congestive) and diastolic (congestive) heart failure: Secondary | ICD-10-CM

## 2015-11-14 DIAGNOSIS — R0602 Shortness of breath: Secondary | ICD-10-CM | POA: Diagnosis not present

## 2015-11-14 MED ORDER — MIRTAZAPINE 15 MG PO TABS: 15.0000 mg | ORAL_TABLET | Freq: Every day | ORAL | Status: AC

## 2015-11-14 MED ORDER — ALBUTEROL SULFATE HFA 108 (90 BASE) MCG/ACT IN AERS: 2.0000 | INHALATION_SPRAY | Freq: Four times a day (QID) | RESPIRATORY_TRACT | Status: AC | PRN

## 2015-11-14 NOTE — Assessment & Plan Note (Signed)
No recent lipid panel available We'll discuss this with her in follow-up   Total encounter time more than 25 minutes  Greater than 50% was spent in counseling and coordination of care with the patient

## 2015-11-14 NOTE — Progress Notes (Signed)
Patient ID: [name] [name], female    DOB: 02/14/1936, 81 y.o.   MRN: [id number]  HPI Comments: [name] [name] is a 80 y.o. female with long history of smoking who continues to smoke, combined CHF (EF 49-82%, + diastolic dysfunction, multiple WMAs), HTN, HLD, COPD,  tobacco abuse, rheumatoid arthritis, borderline DM2 and GERD. She presents for routine followup of her cardiomyopathy, hypertension.history of SVT managed with amiodarone, diltiazem. She does not want ablation  In follow-up today, she reports that she had been doing well until her diltiazem was changed by the pharmacy Instead of Cardizem CD, she received cardia XT. Milligram was the same She reports that she does not feel as well on the new "pink pill". Previous pill color was green Also she reports that her mirtazapine pill was changed to a different vendor  Reports having shortness of breath on exertion. She continues to smoke Previously was on Symbicort, she has not renewed this prescription  rare episodes of SVT, significantly improved  EKG on today's visit shows normal sinus rhythm with rate 94 bpm, left anterior fascicular block  Other past medical history She has called EMTs 6 times since 2015 for SVT, often requiring adenosine. With these episodes she has shortness of breath and chest discomfort.  She continues to smoke daily     lung nodule seen on CT scan of the chest, 1.7 cm ACE inhibitor cough  hospital admission April 21 with discharge 01/12/2014. Diagnosed with SVT, non-ST elevation MI, cardiomyopathy with ejection fraction 30%,   Heart rate initially measured at 170 beats per minute. Given adenosine. Troponin up to 0.23. Metoprolol was increased up to 50 mg daily.  Also started on Cymbalta for depression. Depression exacerbated by loss of several family members.  Imaging in the hospital also showed lung nodules recommendation suggested with primary care. 1.7 cm nodule anterior medial base of the left  lower lobe  Recent recurrence of her SVT may 32,015 with heart rate up to 200 beats per minute requiring adenosine. Evaluation in the emergency room that evening and discharged home after negative cardiac enzymes She felt that she was having an anxiety attack. Notes indicate she converted to normal sinus rhythm after adenosine was given confirming SVT.  cardiac catheterization in 2008 which revealed normal cors. She was evaluated for tachycardia/palpitations at that time which were managed by AVN blockers.  Previously [facility] to [facility] 2014 for cough and shortness of breath attributed to bronchitis. Cardiology was consulted due to elevated cardiac enzymes (trend 0.31->0.61->0.23).   2D echo 01/18/2013: LVEF 30-35%, mod global LV systolic dysfunction, diastolic dysfunction, mid-distal anterior wall, apical and mid-apical inferior/posterior WMAs, mild MR/TR, normal RVSP.   Hgb A1C 7.3%,  LDL 96,  TC 147   She notes chronic DOE/SOB which she attributes to smoking and COPD. occasional palpitations.   Daughter  passed away from lung cancer. Other family members with medical issues as well Staying with her son at nighttime   Allergies  Allergen Reactions  . Benadryl [Diphenhydramine Hcl] Hives  . Lisinopril Cough  . Percocet [Oxycodone-Acetaminophen] Other (See Comments)    Reaction:  Dizziness   . Sulfa Antibiotics Hives    Current Outpatient Prescriptions on File Prior to Visit  Medication Sig Dispense Refill  . amiodarone (PACERONE) 200 MG tablet Take 200 mg by mouth 2 (two) times daily.    Marland Kitchen aspirin 81 MG tablet Take 81 mg by mouth daily.    Marland Kitchen diltiazem (CARDIZEM CD) 120 MG 24 hr capsule Take  1 capsule (120 mg total) by mouth daily. 30 capsule 5  . furosemide (LASIX) 20 MG tablet Take 1 tablet (20 mg total) by mouth 2 (two) times daily as needed for edema. 60 tablet 6  . LORazepam (ATIVAN) 0.5 MG tablet Take 0.5 mg by mouth every 8 (eight) hours as needed for anxiety.    .  ondansetron (ZOFRAN) 4 MG tablet Take 4 mg by mouth every 8 (eight) hours as needed for nausea or vomiting.    . pantoprazole (PROTONIX) 40 MG tablet Take 40 mg by mouth 2 (two) times daily.    . predniSONE (DELTASONE) 10 MG tablet Take 10 mg by mouth daily.  0  . traMADol (ULTRAM) 50 MG tablet Take 50 mg by mouth every 6 (six) hours as needed for moderate pain.      No current facility-administered medications on file prior to visit.    Past Medical History  Diagnosis Date  . Hypertensive heart disease   . Hyperlipidemia   . COPD (chronic obstructive pulmonary disease) ([place])     a. With ongoing tobacco use.  Marland Kitchen GERD (gastroesophageal reflux disease)   . Anxiety   . Rheumatoid arthritis ([place])   . Osteoarthritis   . Hyperuricemia   . Chronic combined systolic and diastolic CHF, NYHA class 1 ([place])     a. 12/2013 Echo: EF 30-35%, DD, mid-distal anterior wall, apical and mid-apical inferior/posterior WMAs, mild MR/TR, normal RVSP;  b. 07/2015 Echo: EF nl, Gr1 DD, mild MR.  . CKD II - III   . Thyroid nodule   . Tobacco abuse   . S/P cardiac cath     a. 2008 Cath: nl cors.  . SVT (supraventricular tachycardia) ([place])     a. 12/2013 & 01/2014-->broke with adenosine;  b. 07/2015 Amio started.  . Pulmonary nodule, left     a.  H/O 1.7 cm nodule anterior medial base of the left lower lobe, not seen on 12/2013 CXR [facility]).  . Collagen vascular disease [facility])     Past Surgical History  Procedure Laterality Date  . Bowel obstruction    . Tubal ligation    . Cardiac catheterization  2008    [facility]    Social History  reports that she has been smoking Cigarettes.  She has a 12.5 pack-year smoking history. She has never used smokeless tobacco. She reports that she does not drink alcohol or use illicit drugs.  Family History family history includes Diabetes in her other; Hypertension in her mother and other. There is no history of Coronary artery disease or Stroke.   Review of Systems   Constitutional: Negative.   Respiratory: Negative.   Cardiovascular: Positive for palpitations.       Tachycardia  Gastrointestinal: Negative.   Musculoskeletal: Negative.   Skin: Negative.   Neurological: Negative.   Hematological: Negative.   Psychiatric/Behavioral: Positive for sleep disturbance. The patient is nervous/anxious.   All other systems reviewed and are negative.   BP 120/70 mmHg  Pulse 94  Ht '5\' 11"'$  (1.803 m)  Wt 191 lb 8 oz (86.864 kg)  BMI 26.72 kg/m2  Physical Exam  Constitutional: She is oriented to person, place, and time. She appears well-developed and well-nourished.  Obese  HENT:  Head: Normocephalic.  Nose: Nose normal.  Mouth/Throat: Oropharynx is clear and moist.  Eyes: Conjunctivae are normal. Pupils are equal, round, and reactive to light.  Neck: Normal range of motion. Neck supple. No JVD present.  Cardiovascular: Normal rate, regular rhythm, S1  normal, S2 normal and intact distal pulses.  Exam reveals no gallop and no friction rub.   Murmur heard.  Systolic murmur is present with a grade of 2/6  Pulmonary/Chest: Effort normal and breath sounds normal. No respiratory distress. She has no wheezes. She has no rales. She exhibits no tenderness.  Abdominal: Soft. Bowel sounds are normal. She exhibits no distension. There is no tenderness.  Musculoskeletal: Normal range of motion. She exhibits no edema or tenderness.  Lymphadenopathy:    She has no cervical adenopathy.  Neurological: She is alert and oriented to person, place, and time. Coordination normal.  Skin: Skin is warm and dry. No rash noted. No erythema.  Psychiatric: She has a normal mood and affect. Her behavior is normal. Judgment and thought content normal.    Assessment and Plan  Nursing note and vitals reviewed.

## 2015-11-14 NOTE — Assessment & Plan Note (Signed)
She appears relatively euvolemic on today's visit, she continues to take her Lasix as needed Minimal leg edema on today's visit

## 2015-11-14 NOTE — Assessment & Plan Note (Signed)
We have recommended that she stay on her amiodarone and diltiazem She will talk to the pharmacy to find out what the "green pill" was that she was taking before.  Likely Cardizem CD 120 mg daily. Pharmacy has changed vendor

## 2015-11-14 NOTE — Assessment & Plan Note (Deleted)
She appears relatively euvolemic on today's visit, she continues to take her Lasix as needed Minimal leg edema on today's visit

## 2015-11-14 NOTE — Assessment & Plan Note (Signed)
Blood pressure is well controlled on today's visit. No changes made to the medications. 

## 2015-11-14 NOTE — Patient Instructions (Signed)
You are doing well. No medication changes were made.  Please use albuterol as needed for shortness of breath  Please call us if you have new issues that need to be addressed before your next appt.  Your physician wants you to follow-up in: 6 months.  You will receive a reminder letter in the mail two months in advance. If you don't receive a letter, please call our office to schedule the follow-up appointment.

## 2015-11-14 NOTE — Assessment & Plan Note (Signed)
We have encouraged her to continue to work on weaning her cigarettes and smoking cessation. She will continue to work on this and does not want any assistance with chantix.  

## 2015-11-28 ENCOUNTER — Emergency Department
Admission: EM | Admit: 2015-11-28 | Discharge: 2015-11-29 | Disposition: A | Discharge status: Home / Self Care | Payer: Medicare Other | Attending: Emergency Medicine | Admitting: Emergency Medicine

## 2015-11-28 ENCOUNTER — Encounter: Payer: Self-pay | Admitting: Emergency Medicine

## 2015-11-28 DIAGNOSIS — E860 Dehydration: Secondary | ICD-10-CM | POA: Insufficient documentation

## 2015-11-28 DIAGNOSIS — I5042 Chronic combined systolic (congestive) and diastolic (congestive) heart failure: Secondary | ICD-10-CM | POA: Diagnosis not present

## 2015-11-28 DIAGNOSIS — Z79899 Other long term (current) drug therapy: Secondary | ICD-10-CM | POA: Diagnosis not present

## 2015-11-28 DIAGNOSIS — F17210 Nicotine dependence, cigarettes, uncomplicated: Secondary | ICD-10-CM | POA: Insufficient documentation

## 2015-11-28 DIAGNOSIS — R630 Anorexia: Secondary | ICD-10-CM | POA: Insufficient documentation

## 2015-11-28 DIAGNOSIS — I130 Hypertensive heart and chronic kidney disease with heart failure and stage 1 through stage 4 chronic kidney disease, or unspecified chronic kidney disease: Secondary | ICD-10-CM | POA: Diagnosis not present

## 2015-11-28 DIAGNOSIS — N183 Chronic kidney disease, stage 3 (moderate): Secondary | ICD-10-CM | POA: Insufficient documentation

## 2015-11-28 DIAGNOSIS — Z7952 Long term (current) use of systemic steroids: Secondary | ICD-10-CM | POA: Insufficient documentation

## 2015-11-28 DIAGNOSIS — R110 Nausea: Secondary | ICD-10-CM | POA: Diagnosis not present

## 2015-11-28 DIAGNOSIS — R42 Dizziness and giddiness: Secondary | ICD-10-CM | POA: Diagnosis present

## 2015-11-28 DIAGNOSIS — E119 Type 2 diabetes mellitus without complications: Secondary | ICD-10-CM | POA: Insufficient documentation

## 2015-11-28 DIAGNOSIS — Z7982 Long term (current) use of aspirin: Secondary | ICD-10-CM | POA: Insufficient documentation

## 2015-11-28 LAB — URINALYSIS COMPLETE WITH MICROSCOPIC (ARMC ONLY)
BACTERIA UA: NONE SEEN
BILIRUBIN URINE: NEGATIVE
Glucose, UA: NEGATIVE mg/dL
HGB URINE DIPSTICK: NEGATIVE
Ketones, ur: NEGATIVE mg/dL
LEUKOCYTES UA: NEGATIVE
NITRITE: NEGATIVE
PH: 8 (ref 5.0–8.0)
Protein, ur: NEGATIVE mg/dL
SPECIFIC GRAVITY, URINE: 1.011 (ref 1.005–1.030)

## 2015-11-28 LAB — CBC WITH DIFFERENTIAL/PLATELET
BASOS ABS: 0 10*3/uL (ref 0–0.1)
BASOS PCT: 0 %
Eosinophils Absolute: 0.1 10*3/uL (ref 0–0.7)
Eosinophils Relative: 1 %
HEMATOCRIT: 39.7 % (ref 35.0–47.0)
Hemoglobin: 12.9 g/dL (ref 12.0–16.0)
LYMPHS PCT: 20 %
Lymphs Abs: 2 10*3/uL (ref 1.0–3.6)
MCH: 28.3 pg (ref 26.0–34.0)
MCHC: 32.5 g/dL (ref 32.0–36.0)
MCV: 87 fL (ref 80.0–100.0)
Monocytes Absolute: 0.7 10*3/uL (ref 0.2–0.9)
Monocytes Relative: 7 %
NEUTROS ABS: 7.1 10*3/uL — AB (ref 1.4–6.5)
NEUTROS PCT: 72 %
Platelets: 261 10*3/uL (ref 150–440)
RBC: 4.56 MIL/uL (ref 3.80–5.20)
RDW: 17.3 % — ABNORMAL HIGH (ref 11.5–14.5)
WBC: 9.8 10*3/uL (ref 3.6–11.0)

## 2015-11-28 LAB — HEPATIC FUNCTION PANEL
ALK PHOS: 72 U/L (ref 38–126)
ALT: 12 U/L — AB (ref 14–54)
AST: 15 U/L (ref 15–41)
Albumin: 3.2 g/dL — ABNORMAL LOW (ref 3.5–5.0)
Total Bilirubin: 0.5 mg/dL (ref 0.3–1.2)
Total Protein: 8.5 g/dL — ABNORMAL HIGH (ref 6.5–8.1)

## 2015-11-28 LAB — BASIC METABOLIC PANEL
ANION GAP: 8 (ref 5–15)
BUN: 20 mg/dL (ref 6–20)
CALCIUM: 9.5 mg/dL (ref 8.9–10.3)
CO2: 29 mmol/L (ref 22–32)
Chloride: 104 mmol/L (ref 101–111)
Creatinine, Ser: 1.45 mg/dL — ABNORMAL HIGH (ref 0.44–1.00)
GFR calc Af Amer: 39 mL/min — ABNORMAL LOW (ref >60)
GFR, EST NON AFRICAN AMERICAN: 34 mL/min — AB (ref >60)
GLUCOSE: 107 mg/dL — AB (ref 65–99)
POTASSIUM: 3.9 mmol/L (ref 3.5–5.1)
SODIUM: 141 mmol/L (ref 135–145)

## 2015-11-28 MED ORDER — FAMOTIDINE 20 MG PO TABS
40.0000 mg | ORAL_TABLET | Freq: Once | ORAL | Status: AC
  Administered 2015-11-28: 40 mg via ORAL
  Filled 2015-11-28: qty 2

## 2015-11-28 MED ORDER — ONDANSETRON 4 MG PO TBDP: 4.0000 mg | ORAL_TABLET | Freq: Three times a day (TID) | ORAL | Status: DC | PRN

## 2015-11-28 MED ORDER — GI COCKTAIL ~~LOC~~
30.0000 mL | ORAL | Status: AC
  Administered 2015-11-28: 30 mL via ORAL
  Filled 2015-11-28: qty 30

## 2015-11-28 MED ORDER — SODIUM CHLORIDE 0.9 % IV SOLN
Freq: Once | INTRAVENOUS | Status: AC
  Administered 2015-11-28: 22:00:00 via INTRAVENOUS

## 2015-11-28 MED ORDER — SODIUM CHLORIDE 0.9 % IV BOLUS (SEPSIS)
1000.0000 mL | Freq: Once | INTRAVENOUS | Status: AC
  Administered 2015-11-28: 1000 mL via INTRAVENOUS

## 2015-11-28 NOTE — ED Notes (Signed)
[name] [name] (daughter) [phone]

## 2015-11-28 NOTE — ED Notes (Signed)
Brought in via family  Having some stomach discomfort   Nauseated and some dizziness. States sx's started about 1 month ago

## 2015-11-28 NOTE — ED Notes (Signed)
Pt reports feeling better; no complaints of nausea/vomitng/abdominal discomfort; MD made aware. Pt also given meal tray and cup of coffee.

## 2015-11-28 NOTE — ED Notes (Signed)
Ambulated pt from bed to bathroom in room. Pt stated that they did not feel dizzy at all. Pt. States that they also do not feel nauseous anymore. Pt. Verbalized that it was nothing else needed from staff and that she was comfortable/ pt was hooked back up the monitor. Pt. Stated "I feel good now" since my being in her room.

## 2015-11-28 NOTE — Discharge Instructions (Signed)

## 2015-11-28 NOTE — ED Provider Notes (Signed)
[facility] Emergency Department Provider Note  ____________________________________________  Time seen: 7:20 PM  I have reviewed the triage vital signs and the nursing notes.   HISTORY  Chief Complaint Dizziness and Nausea    HPI [name] [name] is a 80 y.o. female component 5 family for abdominal discomfort with nausea. Patient reports this is been on for about a month. She's had decreased oral intake and states that she has no appetite and when she does eat it doesn't taste right. With this she also feels like her breath has become very bad. She was treated with antibiotics for presumed dental infection by her PCP but she doesn't think that that was the issue, and it did not help. Denies chest pain shortness of breath fevers chills or cough. No body aches. No diarrhea. No vomiting. Compliant with medications.   Past Medical History  Diagnosis Date  . Hypertensive heart disease   . Hyperlipidemia   . COPD (chronic obstructive pulmonary disease) ([place])     a. With ongoing tobacco use.  Marland Kitchen GERD (gastroesophageal reflux disease)   . Anxiety   . Rheumatoid arthritis ([place])   . Osteoarthritis   . Hyperuricemia   . Chronic combined systolic and diastolic CHF, NYHA class 1 ([place])     a. 12/2013 Echo: EF 30-35%, DD, mid-distal anterior wall, apical and mid-apical inferior/posterior WMAs, mild MR/TR, normal RVSP;  b. 07/2015 Echo: EF nl, Gr1 DD, mild MR.  . CKD II - III   . Thyroid nodule   . Tobacco abuse   . S/P cardiac cath     a. 2008 Cath: nl cors.  . SVT (supraventricular tachycardia) ([place])     a. 12/2013 & 01/2014-->broke with adenosine;  b. 07/2015 Amio started.  . Pulmonary nodule, left     a.  H/O 1.7 cm nodule anterior medial base of the left lower lobe, not seen on 12/2013 CXR [facility]).  . Collagen vascular disease [facility])      Patient Active Problem List   Diagnosis Date Noted  . Chronic combined systolic and diastolic CHF, NYHA class 1 ([place])   .  Hypertensive heart disease   . Smoker   . Weakness   . Chronic diastolic CHF (congestive heart failure) ([place])   . Centrilobular emphysema ([place])   . SVT (supraventricular tachycardia) ([place]) 01/23/2014  . Frequent PVCs 04/14/2013  . Chronic combined systolic and diastolic CHF (congestive heart failure) ([place]) 01/26/2013  . Sinus tachycardia ([place]) 01/26/2013  . Diabetes mellitus, new onset ([place]) 01/26/2013  . Essential hypertension 01/26/2013  . Hyperlipidemia 01/26/2013  . TOBACCO ABUSE 06/20/2010     Past Surgical History  Procedure Laterality Date  . Bowel obstruction    . Tubal ligation    . Cardiac catheterization  2008    [facility]     Current Outpatient Rx  Name  Route  Sig  Dispense  Refill  . albuterol (PROVENTIL HFA;VENTOLIN HFA) 108 (90 Base) MCG/ACT inhaler   Inhalation   Inhale 2 puffs into the lungs every 6 (six) hours as needed for wheezing or shortness of breath.   1 Inhaler   6   . amiodarone (PACERONE) 200 MG tablet   Oral   Take 200 mg by mouth 2 (two) times daily.         Marland Kitchen aspirin 81 MG tablet   Oral   Take 81 mg by mouth daily.         Marland Kitchen diltiazem (CARDIZEM CD) 120 MG 24 hr  capsule   Oral   Take 1 capsule (120 mg total) by mouth daily.   30 capsule   5   . furosemide (LASIX) 20 MG tablet   Oral   Take 1 tablet (20 mg total) by mouth 2 (two) times daily as needed for edema.   60 tablet   6   . LORazepam (ATIVAN) 0.5 MG tablet   Oral   Take 0.5 mg by mouth every 8 (eight) hours as needed for anxiety.         . mirtazapine (REMERON) 15 MG tablet   Oral   Take 1 tablet (15 mg total) by mouth at bedtime.   30 tablet   6     Patient prefers round pill, green-blue color   . ondansetron (ZOFRAN ODT) 4 MG disintegrating tablet   Oral   Take 1 tablet (4 mg total) by mouth every 8 (eight) hours as needed for nausea or vomiting.   20 tablet   0   . ondansetron (ZOFRAN) 4 MG tablet   Oral   Take 4 mg by mouth every 8 (eight) hours as  needed for nausea or vomiting.         . pantoprazole (PROTONIX) 40 MG tablet   Oral   Take 40 mg by mouth 2 (two) times daily.         . predniSONE (DELTASONE) 10 MG tablet   Oral   Take 10 mg by mouth daily.      0   . traMADol (ULTRAM) 50 MG tablet   Oral   Take 50 mg by mouth every 6 (six) hours as needed for moderate pain.             Allergies Benadryl; Lisinopril; Percocet; and Sulfa antibiotics   Family History  Problem Relation Age of Onset  . Coronary artery disease Neg Hx     Early  . Stroke Neg Hx     CVA  . Hypertension Other   . Diabetes Other   . Hypertension Mother     Social History Social History  Substance Use Topics  . Smoking status: Current Some Day Smoker -- 0.25 packs/day for 50 years    Types: Cigarettes  . Smokeless tobacco: Never Used  . Alcohol Use: No    Review of Systems  Constitutional:   No fever or chills. No weight changes. Positive malaise and loss of appetite Eyes:   No blurry vision or double vision.  ENT:   No sore throat.  Cardiovascular:   No chest pain. Respiratory:   No dyspnea or cough. Gastrointestinal:   Negative for abdominal pain, vomiting and diarrhea.  No BRBPR or melena. Genitourinary:   Negative for dysuria or difficulty urinating. Musculoskeletal:   Negative for back pain. No joint swelling or pain. Skin:   Negative for rash. Neurological:   Negative for headaches, focal weakness or numbness. Psychiatric:  No anxiety or depression.   Endocrine:  No changes in energy or sleep difficulty.  10-point ROS otherwise negative.  ____________________________________________   PHYSICAL EXAM:  VITAL SIGNS: ED Triage Vitals  Enc Vitals Group     BP 11/28/15 1600 129/83 mmHg     Pulse Rate 11/28/15 1600 104     Resp 11/28/15 1600 18     Temp 11/28/15 1600 98.9 F (37.2 C)     Temp Source 11/28/15 1600 Oral     SpO2 11/28/15 1600 97 %     Weight 11/28/15 1600 190 lb (86.183  kg)     Height  11/28/15 1600 '5\' 11"'$  (1.803 m)     Head Cir --      Peak Flow --      Pain Score 11/28/15 1601 0     Pain Loc --      Pain Edu? --      Excl. in [place]? --     Vital signs reviewed, nursing assessments reviewed.   Constitutional:   Alert and oriented. Well appearing and in no distress. Eyes:   No scleral icterus. No conjunctival pallor. PERRL. EOMI ENT   Head:   Normocephalic and atraumatic.   Nose:   No congestion/rhinnorhea. No septal hematoma   Mouth/Throat:   Dry mucous membranes, no pharyngeal erythema. No peritonsillar mass.    Neck:   No stridor. No SubQ emphysema. No meningismus. Hematological/Lymphatic/Immunilogical:   No cervical lymphadenopathy. Cardiovascular:   RRR. Symmetric bilateral radial and DP pulses.  No murmurs.  Respiratory:   Normal respiratory effort without tachypnea nor retractions. Breath sounds are clear and equal bilaterally. No wheezes/rales/rhonchi. Gastrointestinal:   Soft and nontender. Non distended. There is no CVA tenderness.  No rebound, rigidity, or guarding. Genitourinary:   deferred Musculoskeletal:   Nontender with normal range of motion in all extremities. No joint effusions.  No lower extremity tenderness.  No edema. Neurologic:   Normal speech and language.  CN 2-10 normal. Motor grossly intact. No gross focal neurologic deficits are appreciated.  Skin:    Skin is warm, dry and intact. No rash noted.  No petechiae, purpura, or bullae. Poor skin turgor Psychiatric:   Mood and affect are normal. ____________________________________________    LABS (pertinent positives/negatives) (all labs ordered are listed, but only abnormal results are displayed) Labs Reviewed  BASIC METABOLIC PANEL - Abnormal; Notable for the following:    Glucose, Bld 107 (*)    Creatinine, Ser 1.45 (*)    GFR calc non Af Amer 34 (*)    GFR calc Af Amer 39 (*)    All other components within normal limits  CBC WITH DIFFERENTIAL/PLATELET - Abnormal;  Notable for the following:    RDW 17.3 (*)    Neutro Abs 7.1 (*)    All other components within normal limits  URINALYSIS COMPLETEWITH MICROSCOPIC ([facility] ONLY)   ____________________________________________   EKG    ____________________________________________    RADIOLOGY    ____________________________________________   PROCEDURES   ____________________________________________   INITIAL IMPRESSION / ASSESSMENT AND PLAN / ED COURSE  Pertinent labs & imaging results that were available during my care of the patient were reviewed by me and considered in my medical decision making (see chart for details).  Vital signs unremarkable. Patient is very well-appearing no acute distress. Clinically appears to be mildly dehydrated. We'll give IV fluids and check labs and urinalysis.  ----------------------------------------- 8:38 PM on 11/28/2015 -----------------------------------------  Chemistry panel shows a slight elevation in her creatinine consistent with some mild dehydration. Based on exam I have low suspicion for pyelonephritis or severe outflow obstruction as a cause for this change. No evidence of sepsis. We'll follow-up urinalysis and plan for discharge home with antiemetics and antibiotics as indicated by the urinalysis result.     ____________________________________________   FINAL CLINICAL IMPRESSION(S) / ED DIAGNOSES  Final diagnoses:  Dehydration     ----------------------------------------- 10:24 PM on 11/29/2015 -----------------------------------------  Late entry note, patient was signed out to Dr. Burlene Arnt at 10:00 PM on March 8 to follow up on urinalysis with plan for discharge home.Marland Kitchen  Carrie Mew, MD 11/29/15 2225

## 2015-11-28 NOTE — ED Provider Notes (Addendum)
-----------------------------------------   8:47 PM on 11/28/2015 -----------------------------------------  Signed out to me at this time. Patient with decreased appetite and mild dehydration. Per Dr. Jerene Canny plan, if urinalysis is negative she is to be discharged.  Schuyler Amor, MD 11/28/15 2047  ----------------------------------------- 12:02 AM on 11/29/2015 -----------------------------------------  Patient with no abdominal pain, eating and drinking, workup reassuring here. We will discharge. Patient will follow closely with her primary care doctor. Serial abdominal exams are benign  Schuyler Amor, MD 11/29/15 0002

## 2015-11-28 NOTE — ED Notes (Signed)
Pt informed of need for urine sample with next void. Pt acknowledges understanding and will call for assistance when ready to use the BR.

## 2015-12-25 ENCOUNTER — Ambulatory Visit: Payer: Medicare Other | Admitting: Family

## 2016-01-07 ENCOUNTER — Ambulatory Visit: Payer: Medicare Other | Admitting: Family

## 2016-01-14 ENCOUNTER — Emergency Department: Payer: Medicare Other

## 2016-01-14 ENCOUNTER — Emergency Department
Admission: EM | Admit: 2016-01-14 | Discharge: 2016-01-14 | Disposition: A | Discharge status: Home / Self Care | Payer: Medicare Other | Attending: Student | Admitting: Student

## 2016-01-14 ENCOUNTER — Encounter: Payer: Self-pay | Admitting: Emergency Medicine

## 2016-01-14 DIAGNOSIS — Z881 Allergy status to other antibiotic agents status: Secondary | ICD-10-CM | POA: Insufficient documentation

## 2016-01-14 DIAGNOSIS — N183 Chronic kidney disease, stage 3 (moderate): Secondary | ICD-10-CM | POA: Diagnosis not present

## 2016-01-14 DIAGNOSIS — Z888 Allergy status to other drugs, medicaments and biological substances status: Secondary | ICD-10-CM | POA: Insufficient documentation

## 2016-01-14 DIAGNOSIS — E785 Hyperlipidemia, unspecified: Secondary | ICD-10-CM | POA: Insufficient documentation

## 2016-01-14 DIAGNOSIS — Z79899 Other long term (current) drug therapy: Secondary | ICD-10-CM | POA: Diagnosis not present

## 2016-01-14 DIAGNOSIS — M069 Rheumatoid arthritis, unspecified: Secondary | ICD-10-CM | POA: Insufficient documentation

## 2016-01-14 DIAGNOSIS — Z7982 Long term (current) use of aspirin: Secondary | ICD-10-CM | POA: Diagnosis not present

## 2016-01-14 DIAGNOSIS — Z885 Allergy status to narcotic agent status: Secondary | ICD-10-CM | POA: Insufficient documentation

## 2016-01-14 DIAGNOSIS — M1990 Unspecified osteoarthritis, unspecified site: Secondary | ICD-10-CM | POA: Insufficient documentation

## 2016-01-14 DIAGNOSIS — F17210 Nicotine dependence, cigarettes, uncomplicated: Secondary | ICD-10-CM | POA: Insufficient documentation

## 2016-01-14 DIAGNOSIS — J449 Chronic obstructive pulmonary disease, unspecified: Secondary | ICD-10-CM | POA: Diagnosis not present

## 2016-01-14 DIAGNOSIS — I5042 Chronic combined systolic (congestive) and diastolic (congestive) heart failure: Secondary | ICD-10-CM | POA: Diagnosis not present

## 2016-01-14 DIAGNOSIS — I130 Hypertensive heart and chronic kidney disease with heart failure and stage 1 through stage 4 chronic kidney disease, or unspecified chronic kidney disease: Secondary | ICD-10-CM | POA: Insufficient documentation

## 2016-01-14 DIAGNOSIS — K047 Periapical abscess without sinus: Secondary | ICD-10-CM | POA: Diagnosis present

## 2016-01-14 DIAGNOSIS — E1122 Type 2 diabetes mellitus with diabetic chronic kidney disease: Secondary | ICD-10-CM | POA: Insufficient documentation

## 2016-01-14 DIAGNOSIS — R42 Dizziness and giddiness: Secondary | ICD-10-CM

## 2016-01-14 LAB — CBC WITH DIFFERENTIAL/PLATELET
Basophils Absolute: 0 10*3/uL (ref 0–0.1)
Basophils Relative: 0 %
Eosinophils Absolute: 0.1 10*3/uL (ref 0–0.7)
Eosinophils Relative: 1 %
HCT: 34.6 % — ABNORMAL LOW (ref 35.0–47.0)
Hemoglobin: 11.2 g/dL — ABNORMAL LOW (ref 12.0–16.0)
Lymphocytes Relative: 20 %
Lymphs Abs: 2.2 10*3/uL (ref 1.0–3.6)
MCH: 27.7 pg (ref 26.0–34.0)
MCHC: 32.3 g/dL (ref 32.0–36.0)
MCV: 86 fL (ref 80.0–100.0)
Monocytes Absolute: 0.7 10*3/uL (ref 0.2–0.9)
Monocytes Relative: 6 %
Neutro Abs: 7.9 10*3/uL — ABNORMAL HIGH (ref 1.4–6.5)
Neutrophils Relative %: 73 %
Platelets: 243 10*3/uL (ref 150–440)
RBC: 4.02 MIL/uL (ref 3.80–5.20)
RDW: 17.6 % — ABNORMAL HIGH (ref 11.5–14.5)
WBC: 10.9 10*3/uL (ref 3.6–11.0)

## 2016-01-14 LAB — COMPREHENSIVE METABOLIC PANEL
ALT: 13 U/L — ABNORMAL LOW (ref 14–54)
AST: 13 U/L — ABNORMAL LOW (ref 15–41)
Albumin: 2.6 g/dL — ABNORMAL LOW (ref 3.5–5.0)
Alkaline Phosphatase: 70 U/L (ref 38–126)
Anion gap: 10 (ref 5–15)
BUN: 20 mg/dL (ref 6–20)
CO2: 26 mmol/L (ref 22–32)
Calcium: 8.8 mg/dL — ABNORMAL LOW (ref 8.9–10.3)
Chloride: 102 mmol/L (ref 101–111)
Creatinine, Ser: 1.66 mg/dL — ABNORMAL HIGH (ref 0.44–1.00)
GFR calc Af Amer: 33 mL/min — ABNORMAL LOW (ref >60)
GFR calc non Af Amer: 29 mL/min — ABNORMAL LOW (ref >60)
Glucose, Bld: 195 mg/dL — ABNORMAL HIGH (ref 65–99)
Potassium: 3.8 mmol/L (ref 3.5–5.1)
Sodium: 138 mmol/L (ref 135–145)
Total Bilirubin: 0.3 mg/dL (ref 0.3–1.2)
Total Protein: 7.3 g/dL (ref 6.5–8.1)

## 2016-01-14 LAB — TROPONIN I: Troponin I: 0.03 ng/mL (ref <0.031)

## 2016-01-14 MED ORDER — AMOXICILLIN-POT CLAVULANATE 875-125 MG PO TABS: 1.0000 | ORAL_TABLET | Freq: Two times a day (BID) | ORAL | Status: AC

## 2016-01-14 MED ORDER — IOPAMIDOL (ISOVUE-370) INJECTION 76%
60.0000 mL | Freq: Once | INTRAVENOUS | Status: AC | PRN
  Administered 2016-01-14: 60 mL via INTRAVENOUS

## 2016-01-14 NOTE — ED Notes (Signed)
Pt has decided to leave AMA despite multiple attempts by myself and MD Edd Fabian to convince her to stay and have a full medical work up - pt daughter at bedside and aware of need for full work up and is unable to convince her mother to stay - discussed with pt the adverse side of her leaving AMA

## 2016-01-14 NOTE — Discharge Instructions (Signed)
You were seen in the emergency department found have a dental abscess. Please take your antibiotic as prescribed and follow-up with a local dentist as soon as possible. As we discussed there was an abnormality on your chest x-ray and we were not able to fully evaluate this without a CT scan of your chest which you refused. Follow up with your doctor soon as possible so that additional testing can be performed. Return immediately to the emergency department if you develop chest pain, difficulty breathing, lightheadedness, fainting or for any other concerns.   OPTIONS FOR DENTAL FOLLOW UP CARE  [facility] [address] [facility] and [place] [email or web address] [phone])  [facility] [phone])  [place] [phone])  [place] [phone])  [facility] [phone]) This clinic caters to the indigent population and is on a lottery system. Location: [employer] [facility], [place], [place], [place] Clinic Hours: Wednesdays from 6pm - 9pm, patients seen by a lottery system. For dates, call or go to [email or web address] Services: Cleanings, fillings and simple extractions. Payment Options: DENTAL WORK IS FREE OF CHARGE. Bring proof of income or support. Best way to get seen: Arrive at 5:15 pm - this is a lottery, NOT first come/first serve, so arriving earlier will not increase your chances of being seen.     [facility] [phone] Select option 1 for emergencies   Location: [facility] of Dentistry, [place], [address], [place] Clinic Hours: No walk-ins accepted - call the day before to schedule an appointment. Check in times are 9:30 am and 1:30 pm. Services: Simple extractions, temporary fillings, pulpectomy/pulp  debridement, uncomplicated abscess drainage. Payment Options: PAYMENT IS DUE AT THE TIME OF SERVICE.  Fee is usually $100-200, additional surgical procedures (e.g. abscess drainage) may be extra. Cash, checks, Visa/MasterCard accepted.  Can file Medicaid if patient is covered for dental - patient should call case worker to check. No discount for [facility] patients. Best way to get seen: MUST call the day before and get onto the schedule. Can usually be seen the next 1-2 days. No walk-ins accepted.     [place] [phone]   Location: [place], [place] Clinic Hours: M, W, Th, F 8am or 1:30pm, Tues 9a or 1:30 - first come/first served. Services: Simple extractions, temporary fillings, uncomplicated abscess drainage.  You do not need to be an [facility] resident. Payment Options: PAYMENT IS DUE AT THE TIME OF SERVICE. Dental insurance, otherwise sliding scale - bring proof of income or support. Depending on income and treatment needed, cost is usually $50-200. Best way to get seen: Arrive early as it is first come/first served.     [facility] [phone]   Location: [place] [facility] Hours: Mon-Thu 8a-5p Services: Most basic dental services including extractions and fillings. Payment Options: PAYMENT IS DUE AT THE TIME OF SERVICE. Sliding scale, up to 50% off - bring proof if income or support. Medicaid with dental option accepted. Best way to get seen: Call to schedule an appointment, can usually be seen within 2 weeks OR they will try to see walk-ins - show up at [place] or 2p (you may have to wait).     [facility] [place] RESIDENTS ONLY   Location: [facility], [place] [address] [place],  [place] [address] Clinic Hours: By appointment only. Monday - Thursday 8am-5pm, Friday 8am-12pm Services: Cleanings,  fillings, extractions. Payment Options: PAYMENT IS DUE AT THE TIME OF SERVICE. Cash, Visa or MasterCard. Sliding scale - $30 minimum per service. Best way to get seen: Come in to office, complete packet and make an appointment - need proof of income or support monies for each household member and proof of [facility] residence. Usually takes about a month to get in.     [facility] [phone]   Location: [address], [place] Clinic Hours: Walk-in Urgent Care Dental Services are offered Monday-Friday mornings only. The numbers of emergencies accepted daily is limited to the number of providers available. Maximum 15 - Mondays, Wednesdays & Thursdays Maximum 10 - Tuesdays & Fridays Services: You do not need to be a [facility] resident to be seen for a dental emergency. Emergencies are defined as pain, swelling, abnormal bleeding, or dental trauma. Walkins will receive x-rays if needed. NOTE: Dental cleaning is not an emergency. Payment Options: PAYMENT IS DUE AT THE TIME OF SERVICE. Minimum co-pay is $40.00 for uninsured patients. Minimum co-pay is $3.00 for Medicaid with dental coverage. Dental Insurance is accepted and must be presented at time of visit. Medicare does not cover dental. Forms of payment: Cash, credit card, checks. Best way to get seen: If not previously registered with the clinic, walk-in dental registration begins at 7:15 am and is on a first come/first serve basis. If previously registered with the clinic, call to make an appointment.     [facility] [place] ONLY   Location: [address] [place], [place] Clinic Hours: Mon-Thu 10a-2p Services: Extractions only! Payment Options: FREE (donations accepted) - bring proof of income or support Best way to get seen: Call and schedule an appointment OR come at 8am on the 1st Monday of every month (except for holidays) when it is  first come/first served.     [facility] [phone]   Location: [place], [place] Clinic Hours: Friday mornings Services, Payment Options, Best way to get seen: Call for info

## 2016-01-14 NOTE — ED Notes (Signed)
Spoke to [place] in the lab about the CMP and the Troponin that have not been resulted - per [name] [name] the order was canceled and then reordered but they are just now getting around to running the sample - this was addressed because I called CT to let them know the pt was ready for testing and they said they had been awaiting labs for over an hour - test should be completed soon per [name] [name]

## 2016-01-14 NOTE — ED Notes (Addendum)
Pt reports she is dizzy and "swimmy headed" - she states that everything she eats is "bitter" and that she is nauseated - denies vomiting - these symptoms have been going on for 2 months - family doctor states he has a dental abscess that is causing the problem

## 2016-01-14 NOTE — ED Notes (Addendum)
Pt reports that she feels like she has "some kind of infection" because everything she eats is bitter and she "is falling a lot." Pt states she fell yesterday, and almost fell today. Pt reports that sometimes her head hurts, sometimes her abdomen hurts. She has been on abx x 7 days for tooth infection, but they didn't do any good. Pt's main complaint is bitter taste in mouth, as well as drooling and bad taste in mouth.

## 2016-01-14 NOTE — ED Notes (Signed)
CT notified that pt is ready for testing

## 2016-01-14 NOTE — ED Provider Notes (Signed)
[facility] Emergency Department Provider Note  ____________________________________________  Time seen: Approximately 4:15 PM  I have reviewed the triage vital signs and the nursing notes.   HISTORY  Chief Complaint Multiple Medical Complaints     HPI [name] [name] is a 80 y.o. female with history of combined CHF (EF 30-35%, improved up to :55% on echo in 4627, + diastolic dysfunction) HTN, HLD, COPD, tobacco abuse, rheumatoid arthritis, borderline DM2 and GERD who presents for evaluation of "a bad taste in my mouth" gradual onset for 2 months, ongoing, constant, intermittent improves with antibiotics, currently moderate. Patient reports she had 2 rounds of antibiotics for dental infection//abscess however they only helped intermittently. She was not referred to a dentist. No fevers or chills. No vomiting or diarrhea. No chest pain or difficulty breathing. He does report that she has intermittently had room spinning dizziness over the past 2 months. She was dizzy last night when, she did not lose consciousness, she does not think she hit her head. She denies any numbness or weakness in the arms or legs, no vision change.   Past Medical History  Diagnosis Date  . Hypertensive heart disease   . Hyperlipidemia   . COPD (chronic obstructive pulmonary disease) ([place])     a. With ongoing tobacco use.  Marland Kitchen GERD (gastroesophageal reflux disease)   . Anxiety   . Rheumatoid arthritis ([place])   . Osteoarthritis   . Hyperuricemia   . Chronic combined systolic and diastolic CHF, NYHA class 1 ([place])     a. 12/2013 Echo: EF 30-35%, DD, mid-distal anterior wall, apical and mid-apical inferior/posterior WMAs, mild MR/TR, normal RVSP;  b. 07/2015 Echo: EF nl, Gr1 DD, mild MR.  . CKD II - III   . Thyroid nodule   . Tobacco abuse   . S/P cardiac cath     a. 2008 Cath: nl cors.  . SVT (supraventricular tachycardia) ([place])     a. 12/2013 & 01/2014-->broke with adenosine;  b.  07/2015 Amio started.  . Pulmonary nodule, left     a.  H/O 1.7 cm nodule anterior medial base of the left lower lobe, not seen on 12/2013 CXR [facility]).  . Collagen vascular disease ([place])   . Hypertension     Patient Active Problem List   Diagnosis Date Noted  . Chronic combined systolic and diastolic CHF, NYHA class 1 ([place])   . Hypertensive heart disease   . Smoker   . Weakness   . Chronic diastolic CHF (congestive heart failure) ([place])   . Centrilobular emphysema ([place])   . SVT (supraventricular tachycardia) ([place]) 01/23/2014  . Frequent PVCs 04/14/2013  . Chronic combined systolic and diastolic CHF (congestive heart failure) ([place]) 01/26/2013  . Sinus tachycardia ([place]) 01/26/2013  . Diabetes mellitus, new onset ([place]) 01/26/2013  . Essential hypertension 01/26/2013  . Hyperlipidemia 01/26/2013  . TOBACCO ABUSE 06/20/2010    Past Surgical History  Procedure Laterality Date  . Bowel obstruction    . Tubal ligation    . Cardiac catheterization  2008    [facility]    Current Outpatient Rx  Name  Route  Sig  Dispense  Refill  . albuterol (PROVENTIL HFA;VENTOLIN HFA) 108 (90 Base) MCG/ACT inhaler   Inhalation   Inhale 2 puffs into the lungs every 6 (six) hours as needed for wheezing or shortness of breath.   1 Inhaler   6   . amiodarone (PACERONE) 200 MG tablet   Oral   Take 200 mg by  mouth 2 (two) times daily.         Marland Kitchen amoxicillin-clavulanate (AUGMENTIN) 875-125 MG tablet   Oral   Take 1 tablet by mouth 2 (two) times daily.   14 tablet   0   . aspirin 81 MG tablet   Oral   Take 81 mg by mouth daily.         Marland Kitchen diltiazem (CARDIZEM CD) 120 MG 24 hr capsule   Oral   Take 1 capsule (120 mg total) by mouth daily.   30 capsule   5   . furosemide (LASIX) 20 MG tablet   Oral   Take 1 tablet (20 mg total) by mouth 2 (two) times daily as needed for edema.   60 tablet   6   . LORazepam (ATIVAN) 0.5 MG tablet   Oral   Take 0.5 mg by mouth every 8 (eight) hours as needed  for anxiety.         . mirtazapine (REMERON) 15 MG tablet   Oral   Take 1 tablet (15 mg total) by mouth at bedtime.   30 tablet   6     Patient prefers round pill, green-blue color   . ondansetron (ZOFRAN ODT) 4 MG disintegrating tablet   Oral   Take 1 tablet (4 mg total) by mouth every 8 (eight) hours as needed for nausea or vomiting.   20 tablet   0   . ondansetron (ZOFRAN) 4 MG tablet   Oral   Take 4 mg by mouth every 8 (eight) hours as needed for nausea or vomiting.         . pantoprazole (PROTONIX) 40 MG tablet   Oral   Take 40 mg by mouth 2 (two) times daily.         . predniSONE (DELTASONE) 10 MG tablet   Oral   Take 10 mg by mouth daily.      0   . traMADol (ULTRAM) 50 MG tablet   Oral   Take 50 mg by mouth every 6 (six) hours as needed for moderate pain.            Allergies Benadryl; Lisinopril; Percocet; and Sulfa antibiotics  Family History  Problem Relation Age of Onset  . Coronary artery disease Neg Hx     Early  . Stroke Neg Hx     CVA  . Hypertension Other   . Diabetes Other   . Hypertension Mother     Social History Social History  Substance Use Topics  . Smoking status: Current Every Day Smoker -- 0.25 packs/day for 50 years    Types: Cigarettes  . Smokeless tobacco: Never Used  . Alcohol Use: No    Review of Systems Constitutional: No fever/chills Eyes: No visual changes. ENT: No sore throat. Cardiovascular: Denies chest pain. Respiratory: Denies shortness of breath. Gastrointestinal: No abdominal pain.  No nausea, no vomiting.  No diarrhea.  No constipation. Genitourinary: Negative for dysuria. Musculoskeletal: Negative for back pain. Skin: Negative for rash. Neurological: Negative for headaches, focal weakness or numbness.  10-point ROS otherwise negative.  ____________________________________________   PHYSICAL EXAM:  VITAL SIGNS: ED Triage Vitals  Enc Vitals Group     BP 01/14/16 1413 120/72 mmHg      Pulse Rate 01/14/16 1413 97     Resp 01/14/16 1413 20     Temp 01/14/16 1413 98.2 F (36.8 C)     Temp Source 01/14/16 1413 Oral     SpO2 01/14/16  1413 98 %     Weight 01/14/16 1413 192 lb (87.091 kg)     Height 01/14/16 1413 '5\' 11"'$  (1.803 m)     Head Cir --      Peak Flow --      Pain Score --      Pain Loc --      Pain Edu? --      Excl. in [place]? --     Constitutional: Alert and oriented. Well appearing and in no acute distress. Eyes: Conjunctivae are normal. PERRL. EOMI. Head: Atraumatic. Nose: No congestion/rhinnorhea. Mouth/Throat: Mucous membranes are moist.  Oropharynx non-erythematous. Chronically poor dentition with multiple missing teeth, multiple teeth with dental caries. In the left maxillary gums, there is one solitary tooth which remains which I believe is either tooth #11 or 12, there is small associated . Gingival inflammation with abscess. Neck: No stridor.  Supple without meningismus. Cardiovascular: Normal rate, regular rhythm. Grossly normal heart sounds.  Good peripheral circulation. Respiratory: Normal respiratory effort.  No retractions. Lungs CTAB. Gastrointestinal: Soft and nontender. No distention. No CVA tenderness. Genitourinary: deferred Musculoskeletal: No lower extremity tenderness nor edema.  No joint effusions. Neurologic:  Normal speech and language. No gross focal neurologic deficits are appreciated. No gait instability. 5 out of 5 strength bilateral upper and lower extremities, sensation intact to light touch throughout, cranial nerves II through XII intact, normal finger-nose-finger. Skin:  Skin is warm, dry and intact. No rash noted. Psychiatric: Mood and affect are normal. Speech and behavior are normal.  ____________________________________________   LABS (all labs ordered are listed, but only abnormal results are displayed)  Labs Reviewed  CBC WITH DIFFERENTIAL/PLATELET - Abnormal; Notable for the following:    Hemoglobin 11.2 (*)    HCT  34.6 (*)    RDW 17.6 (*)    Neutro Abs 7.9 (*)    All other components within normal limits  COMPREHENSIVE METABOLIC PANEL - Abnormal; Notable for the following:    Glucose, Bld 195 (*)    Creatinine, Ser 1.66 (*)    Calcium 8.8 (*)    Albumin 2.6 (*)    AST 13 (*)    ALT 13 (*)    GFR calc non Af Amer 29 (*)    GFR calc Af Amer 33 (*)    All other components within normal limits  TROPONIN I  CBC WITH DIFFERENTIAL/PLATELET  URINALYSIS COMPLETEWITH MICROSCOPIC ([facility] ONLY)   ____________________________________________  EKG  none  ____________________________________________  RADIOLOGY  CXR IMPRESSION: Wedge-shaped opacity of the left lung, compatible with partial left lower lobe volume loss. Obstructing mass cannot be excluded, and further evaluation with a contrast-enhanced chest CT is recommended.  These results will be called to the ordering clinician or representative by the Radiologist Assistant, and communication documented in the PACS or zVision Dashboard.  CT head IMPRESSION: 1. No acute abnormality. 2. Mild diffuse cerebral and cerebellar atrophy. 3. Minimal chronic small vessel white matter ischemic changes in both cerebral hemispheres.  CTA chest - patient refused ____________________________________________   PROCEDURES  Procedure(s) performed: None  Critical Care performed: No  ____________________________________________   INITIAL IMPRESSION / ASSESSMENT AND PLAN / ED COURSE  Pertinent labs & imaging results that were available during my care of the patient were reviewed by me and considered in my medical decision making (see chart for details).  [name] [name] is a 80 y.o. female with history of combined CHF (EF 30-35%, improved up to :55% on echo in 3016, + diastolic dysfunction)  HTN, HLD, COPD, tobacco abuse, rheumatoid arthritis, borderline DM2 and GERD who presents for evaluation of "a bad taste in my mouth" gradual onset for 2  months as well as intermittent room spinning dizziness that is typically brought on by position change. On exam she is well-appearing and in no acute distress. Her vital signs are stable, she is afebrile. She does have a small dental abscess associated with tooth #11 or 12 for which we will prescribe antibiotics. I discussed with her that she needs to follow-up with a dentist for definitive treatment and I discussed with her that I would provide outpatient dental resources. She has an intact neurological examination however given her intermittent dizziness as well as a fall last night, chest x-ray showing what shaped opacity which could represent a mass or an infarct secondary to PE, will obtain CTA chest as well as a CT head to eval for mass or infarct. Reassess for disposition.  ----------------------------------------- 9:11 PM on 01/14/2016 ----------------------------------------- Labs reviewed. CBC with chronic mild anemia. CMP with creatinine elevation of 1.66, most recently was 1.45 so this does not appear to be far from her baseline. Patient is a difficult stick, her IV infiltrated in the CT scanner and CTA chest was not performed.  She is refusing any more attempts at IV placement. I discussed with her that I don't know what this wedge opacity in her chest is secondary to be rediscussed that it could be a PE and that could kill her as soon as tonight. We discussed that it could be a malignancy. She voices understanding and wants to leave [place] and is refusing any repeat attempts at IV placement. She reports "I understand all that, he told me I could die or could be cancer but you are not sticking me again tonight". CT scan of her head shows no acute abnormality. Troponin negative. Discussed that she would follow-up with her primary care doctor for repeat evaluation. We discussed that she is free to return at any time if she changes her mind or  for any worsening symptoms and she and  her family at bedside are comfortable with the discharge plan. Will discharge her with augmentin. ____________________________________________   FINAL CLINICAL IMPRESSION(S) / ED DIAGNOSES  Final diagnoses:  Dental abscess  Dizziness      Joanne Gavel, MD 01/14/16 2351

## 2016-01-21 ENCOUNTER — Ambulatory Visit: Payer: Medicare Other | Admitting: Family

## 2016-01-21 ENCOUNTER — Telehealth: Payer: Self-pay | Admitting: Family

## 2016-01-21 NOTE — Telephone Encounter (Signed)
Patient called to say that she thinks her diltiazem is causing a bitter taste in her mouth as well as a decreased taste. Since she's not eating well, she says that she's been losing weight. She's also been having recurrent dental infections which could also be contributing to her symptoms. Discussed stopping the diltiazem to see if that improves her symptoms but she expresses concern about that because of her heart rate. Told her she could call her cardiologist to discuss further to get his recommendation but she says that she'd rather come here to discuss. Advised her to stop the diltiazem and told her that we could start another medication (beta blocker) in place of it but that I would need to see her. She was supposed to come in today but she says that she can't get a ride so an appointment was scheduled for Friday, Jan 25, 2016. Should she feel her heart rate increase, she can always resume the medication. Patient is comfortable with this plan.

## 2016-01-25 ENCOUNTER — Telehealth: Payer: Self-pay | Admitting: Family

## 2016-01-25 ENCOUNTER — Ambulatory Visit: Payer: Medicare Other | Admitting: Family

## 2016-01-25 NOTE — Telephone Encounter (Signed)
Patient did not show for her [facility] appointment on 01/25/16. Will attempt to reschedule.

## 2016-02-06 ENCOUNTER — Emergency Department
Admission: EM | Admit: 2016-02-06 | Discharge: 2016-02-06 | Disposition: A | Discharge status: Other Acute Inpatient Hospital | Payer: Medicare Other | Attending: Emergency Medicine | Admitting: Emergency Medicine

## 2016-02-06 ENCOUNTER — Emergency Department: Payer: Medicare Other

## 2016-02-06 ENCOUNTER — Inpatient Hospital Stay (HOSPITAL_COMMUNITY)
Admission: EM | Admit: 2016-02-06 | Discharge: 2016-02-09 | DRG: 243 | Disposition: A | Discharge status: Home Health | Payer: Medicare Other | Source: Other Acute Inpatient Hospital | Attending: Internal Medicine | Admitting: Internal Medicine

## 2016-02-06 DIAGNOSIS — I5042 Chronic combined systolic (congestive) and diastolic (congestive) heart failure: Secondary | ICD-10-CM | POA: Insufficient documentation

## 2016-02-06 DIAGNOSIS — Z7982 Long term (current) use of aspirin: Secondary | ICD-10-CM | POA: Diagnosis not present

## 2016-02-06 DIAGNOSIS — Z6829 Body mass index (BMI) 29.0-29.9, adult: Secondary | ICD-10-CM | POA: Diagnosis not present

## 2016-02-06 DIAGNOSIS — I442 Atrioventricular block, complete: Secondary | ICD-10-CM | POA: Insufficient documentation

## 2016-02-06 DIAGNOSIS — N179 Acute kidney failure, unspecified: Secondary | ICD-10-CM | POA: Diagnosis present

## 2016-02-06 DIAGNOSIS — N183 Chronic kidney disease, stage 3 (moderate): Secondary | ICD-10-CM | POA: Diagnosis present

## 2016-02-06 DIAGNOSIS — M069 Rheumatoid arthritis, unspecified: Secondary | ICD-10-CM | POA: Diagnosis present

## 2016-02-06 DIAGNOSIS — R112 Nausea with vomiting, unspecified: Secondary | ICD-10-CM | POA: Diagnosis present

## 2016-02-06 DIAGNOSIS — E785 Hyperlipidemia, unspecified: Secondary | ICD-10-CM | POA: Diagnosis present

## 2016-02-06 DIAGNOSIS — I252 Old myocardial infarction: Secondary | ICD-10-CM | POA: Diagnosis not present

## 2016-02-06 DIAGNOSIS — R918 Other nonspecific abnormal finding of lung field: Secondary | ICD-10-CM | POA: Diagnosis not present

## 2016-02-06 DIAGNOSIS — C3432 Malignant neoplasm of lower lobe, left bronchus or lung: Secondary | ICD-10-CM | POA: Diagnosis present

## 2016-02-06 DIAGNOSIS — G4733 Obstructive sleep apnea (adult) (pediatric): Secondary | ICD-10-CM | POA: Diagnosis present

## 2016-02-06 DIAGNOSIS — I10 Essential (primary) hypertension: Secondary | ICD-10-CM | POA: Diagnosis present

## 2016-02-06 DIAGNOSIS — I471 Supraventricular tachycardia: Secondary | ICD-10-CM | POA: Diagnosis present

## 2016-02-06 DIAGNOSIS — E440 Moderate protein-calorie malnutrition: Secondary | ICD-10-CM | POA: Diagnosis present

## 2016-02-06 DIAGNOSIS — F419 Anxiety disorder, unspecified: Secondary | ICD-10-CM | POA: Diagnosis present

## 2016-02-06 DIAGNOSIS — Z79899 Other long term (current) drug therapy: Secondary | ICD-10-CM | POA: Insufficient documentation

## 2016-02-06 DIAGNOSIS — R911 Solitary pulmonary nodule: Secondary | ICD-10-CM

## 2016-02-06 DIAGNOSIS — I130 Hypertensive heart and chronic kidney disease with heart failure and stage 1 through stage 4 chronic kidney disease, or unspecified chronic kidney disease: Secondary | ICD-10-CM | POA: Diagnosis present

## 2016-02-06 DIAGNOSIS — I110 Hypertensive heart disease with heart failure: Secondary | ICD-10-CM | POA: Insufficient documentation

## 2016-02-06 DIAGNOSIS — Z882 Allergy status to sulfonamides status: Secondary | ICD-10-CM | POA: Diagnosis not present

## 2016-02-06 DIAGNOSIS — F17200 Nicotine dependence, unspecified, uncomplicated: Secondary | ICD-10-CM | POA: Diagnosis present

## 2016-02-06 DIAGNOSIS — Z888 Allergy status to other drugs, medicaments and biological substances status: Secondary | ICD-10-CM | POA: Diagnosis not present

## 2016-02-06 DIAGNOSIS — F17210 Nicotine dependence, cigarettes, uncomplicated: Secondary | ICD-10-CM | POA: Diagnosis present

## 2016-02-06 DIAGNOSIS — I452 Bifascicular block: Secondary | ICD-10-CM | POA: Diagnosis present

## 2016-02-06 DIAGNOSIS — Z950 Presence of cardiac pacemaker: Secondary | ICD-10-CM

## 2016-02-06 DIAGNOSIS — E1122 Type 2 diabetes mellitus with diabetic chronic kidney disease: Secondary | ICD-10-CM | POA: Diagnosis present

## 2016-02-06 DIAGNOSIS — Z7952 Long term (current) use of systemic steroids: Secondary | ICD-10-CM

## 2016-02-06 DIAGNOSIS — R55 Syncope and collapse: Secondary | ICD-10-CM | POA: Diagnosis present

## 2016-02-06 DIAGNOSIS — I495 Sick sinus syndrome: Secondary | ICD-10-CM | POA: Diagnosis present

## 2016-02-06 DIAGNOSIS — N390 Urinary tract infection, site not specified: Secondary | ICD-10-CM | POA: Diagnosis present

## 2016-02-06 DIAGNOSIS — J432 Centrilobular emphysema: Secondary | ICD-10-CM | POA: Diagnosis present

## 2016-02-06 DIAGNOSIS — Z800 Family history of malignant neoplasm of digestive organs: Secondary | ICD-10-CM

## 2016-02-06 DIAGNOSIS — R531 Weakness: Secondary | ICD-10-CM

## 2016-02-06 DIAGNOSIS — K219 Gastro-esophageal reflux disease without esophagitis: Secondary | ICD-10-CM | POA: Diagnosis present

## 2016-02-06 DIAGNOSIS — R110 Nausea: Secondary | ICD-10-CM

## 2016-02-06 DIAGNOSIS — E860 Dehydration: Secondary | ICD-10-CM | POA: Diagnosis present

## 2016-02-06 DIAGNOSIS — R4020 Unspecified coma: Secondary | ICD-10-CM

## 2016-02-06 HISTORY — DX: Presence of cardiac pacemaker: Z95.0

## 2016-02-06 LAB — BASIC METABOLIC PANEL
Anion gap: 12 (ref 5–15)
BUN: 20 mg/dL (ref 6–20)
CHLORIDE: 102 mmol/L (ref 101–111)
CO2: 21 mmol/L — ABNORMAL LOW (ref 22–32)
Calcium: 8.9 mg/dL (ref 8.9–10.3)
Creatinine, Ser: 1.92 mg/dL — ABNORMAL HIGH (ref 0.44–1.00)
GFR calc Af Amer: 28 mL/min — ABNORMAL LOW (ref >60)
GFR calc non Af Amer: 24 mL/min — ABNORMAL LOW (ref >60)
Glucose, Bld: 340 mg/dL — ABNORMAL HIGH (ref 65–99)
POTASSIUM: 5.7 mmol/L — AB (ref 3.5–5.1)
SODIUM: 135 mmol/L (ref 135–145)

## 2016-02-06 LAB — CBC WITH DIFFERENTIAL/PLATELET
Basophils Absolute: 0 10*3/uL (ref 0–0.1)
Basophils Relative: 0 %
Eosinophils Absolute: 0 10*3/uL (ref 0–0.7)
HEMATOCRIT: 35.3 % (ref 35.0–47.0)
HEMOGLOBIN: 11 g/dL — AB (ref 12.0–16.0)
LYMPHS ABS: 2.3 10*3/uL (ref 1.0–3.6)
MCH: 27.2 pg (ref 26.0–34.0)
MCHC: 31.3 g/dL — ABNORMAL LOW (ref 32.0–36.0)
MCV: 86.7 fL (ref 80.0–100.0)
Monocytes Absolute: 0.7 10*3/uL (ref 0.2–0.9)
Monocytes Relative: 4 %
NEUTROS ABS: 12.7 10*3/uL — AB (ref 1.4–6.5)
Neutrophils Relative %: 81 %
PLATELETS: 294 10*3/uL (ref 150–440)
RBC: 4.07 MIL/uL (ref 3.80–5.20)
RDW: 17.9 % — ABNORMAL HIGH (ref 11.5–14.5)
WBC: 15.7 10*3/uL — AB (ref 3.6–11.0)

## 2016-02-06 LAB — TROPONIN I: Troponin I: 0.03 ng/mL (ref <0.031)

## 2016-02-06 LAB — GLUCOSE, CAPILLARY: Glucose-Capillary: 351 mg/dL — ABNORMAL HIGH (ref 65–99)

## 2016-02-06 MED ORDER — FENTANYL CITRATE (PF) 100 MCG/2ML IJ SOLN
INTRAMUSCULAR | Status: AC
  Filled 2016-02-06: qty 2

## 2016-02-06 MED ORDER — MIDAZOLAM HCL 5 MG/5ML IJ SOLN
INTRAMUSCULAR | Status: AC
  Filled 2016-02-06: qty 5

## 2016-02-06 MED ORDER — ONDANSETRON HCL 4 MG/2ML IJ SOLN
4.0000 mg | Freq: Once | INTRAMUSCULAR | Status: AC
  Administered 2016-02-06: 4 mg via INTRAVENOUS

## 2016-02-06 MED ORDER — ASPIRIN 81 MG PO CHEW
324.0000 mg | CHEWABLE_TABLET | Freq: Once | ORAL | Status: AC
  Administered 2016-02-06: 324 mg via ORAL
  Filled 2016-02-06: qty 4

## 2016-02-06 MED ORDER — FENTANYL CITRATE (PF) 100 MCG/2ML IJ SOLN: 50.0000 ug | Freq: Once | INTRAMUSCULAR | Status: DC

## 2016-02-06 MED ORDER — MIDAZOLAM HCL 5 MG/5ML IJ SOLN: 2.5000 mg | Freq: Once | INTRAMUSCULAR | Status: DC

## 2016-02-06 NOTE — ED Notes (Addendum)
Pt placed on zoll, began pacing.  Pedal  pulses felt. Pt responsive to pain.  44 MA, HR 70.

## 2016-02-06 NOTE — Progress Notes (Signed)
   02/06/16 2210  Clinical Encounter Type  Visited With Patient and family together  Visit Type ED  Referral From Nurse  Consult/Referral To Chaplain  Spiritual Encounters  Spiritual Needs Prayer  Stress Factors  Patient Stress Factors Health changes;Major life changes  Family Stress Factors Major life changes  Met w/patient & family for prayer for safe transport and health.   Chap. Saliou Barnier G. Lipan

## 2016-02-06 NOTE — ED Notes (Addendum)
Pt able to respond to voice.  Pt cleaned. HR 72.

## 2016-02-06 NOTE — ED Notes (Signed)
Pt vomited, suctioned.

## 2016-02-06 NOTE — ED Notes (Addendum)
Family at bedside. Pt verbal.

## 2016-02-06 NOTE — ED Notes (Signed)
Zofran 4 mg IV TEFL teacher

## 2016-02-06 NOTE — ED Provider Notes (Signed)
[facility] Emergency Department Provider Note    ____________________________________________  Time seen: On EMS arrival  I have reviewed the triage vital signs and the nursing notes.   HISTORY  Chief Complaint Loss of Consciousness   History limited by: Altered Mental Status   HPI [name] [name] is a 80 y.o. female who is brought in via EMS under emergency traffic because of concerns for decreased responsiveness. Per EMS family called them out because they were concerned the patient was having a stroke. She was having decreased responsiveness. When EMS first arrived the patient was bradycardic. During transport she became more unresponsive. She was then only responsive to pain. Blood pressure for paramedics was systolic in the 88C.The patient herself was unable to give any initial history.     Past Medical History  Diagnosis Date  . Hypertensive heart disease   . Hyperlipidemia   . COPD (chronic obstructive pulmonary disease) ([place])     a. With ongoing tobacco use.  Marland Kitchen GERD (gastroesophageal reflux disease)   . Anxiety   . Rheumatoid arthritis ([place])   . Osteoarthritis   . Hyperuricemia   . Chronic combined systolic and diastolic CHF, NYHA class 1 ([place])     a. 12/2013 Echo: EF 30-35%, DD, mid-distal anterior wall, apical and mid-apical inferior/posterior WMAs, mild MR/TR, normal RVSP;  b. 07/2015 Echo: EF nl, Gr1 DD, mild MR.  . CKD II - III   . Thyroid nodule   . Tobacco abuse   . S/P cardiac cath     a. 2008 Cath: nl cors.  . SVT (supraventricular tachycardia) ([place])     a. 12/2013 & 01/2014-->broke with adenosine;  b. 07/2015 Amio started.  . Pulmonary nodule, left     a.  H/O 1.7 cm nodule anterior medial base of the left lower lobe, not seen on 12/2013 CXR [facility]).  . Collagen vascular disease ([place])   . Hypertension     Patient Active Problem List   Diagnosis Date Noted  . Chronic combined systolic and diastolic CHF, NYHA class 1 ([place])    . Hypertensive heart disease   . Smoker   . Weakness   . Chronic diastolic CHF (congestive heart failure) ([place])   . Centrilobular emphysema ([place])   . SVT (supraventricular tachycardia) ([place]) 01/23/2014  . Frequent PVCs 04/14/2013  . Chronic combined systolic and diastolic CHF (congestive heart failure) ([place]) 01/26/2013  . Sinus tachycardia ([place]) 01/26/2013  . Diabetes mellitus, new onset ([place]) 01/26/2013  . Essential hypertension 01/26/2013  . Hyperlipidemia 01/26/2013  . TOBACCO ABUSE 06/20/2010    Past Surgical History  Procedure Laterality Date  . Bowel obstruction    . Tubal ligation    . Cardiac catheterization  2008    [facility]    Current Outpatient Rx  Name  Route  Sig  Dispense  Refill  . albuterol (PROVENTIL HFA;VENTOLIN HFA) 108 (90 Base) MCG/ACT inhaler   Inhalation   Inhale 2 puffs into the lungs every 6 (six) hours as needed for wheezing or shortness of breath.   1 Inhaler   6   . amiodarone (PACERONE) 200 MG tablet   Oral   Take 200 mg by mouth 2 (two) times daily.         Marland Kitchen aspirin 81 MG tablet   Oral   Take 81 mg by mouth daily.         Marland Kitchen diltiazem (CARDIZEM CD) 120 MG 24 hr capsule   Oral   Take 1 capsule (  120 mg total) by mouth daily.   30 capsule   5   . furosemide (LASIX) 20 MG tablet   Oral   Take 1 tablet (20 mg total) by mouth 2 (two) times daily as needed for edema.   60 tablet   6   . LORazepam (ATIVAN) 0.5 MG tablet   Oral   Take 0.5 mg by mouth every 8 (eight) hours as needed for anxiety.         . mirtazapine (REMERON) 15 MG tablet   Oral   Take 1 tablet (15 mg total) by mouth at bedtime.   30 tablet   6     Patient prefers round pill, green-blue color   . ondansetron (ZOFRAN ODT) 4 MG disintegrating tablet   Oral   Take 1 tablet (4 mg total) by mouth every 8 (eight) hours as needed for nausea or vomiting.   20 tablet   0   . ondansetron (ZOFRAN) 4 MG tablet   Oral   Take 4 mg by mouth every 8 (eight) hours as  needed for nausea or vomiting.         . pantoprazole (PROTONIX) 40 MG tablet   Oral   Take 40 mg by mouth 2 (two) times daily.         . predniSONE (DELTASONE) 10 MG tablet   Oral   Take 10 mg by mouth daily.      0   . traMADol (ULTRAM) 50 MG tablet   Oral   Take 50 mg by mouth every 6 (six) hours as needed for moderate pain.            Allergies Benadryl; Lisinopril; Percocet; and Sulfa antibiotics  Family History  Problem Relation Age of Onset  . Coronary artery disease Neg Hx     Early  . Stroke Neg Hx     CVA  . Hypertension Other   . Diabetes Other   . Hypertension Mother     Social History Social History  Substance Use Topics  . Smoking status: Current Every Day Smoker -- 0.25 packs/day for 50 years    Types: Cigarettes  . Smokeless tobacco: Never Used  . Alcohol Use: No    Review of Systems Unable to obtain secondary to decreased ____________________________________________   PHYSICAL EXAM:  VITAL SIGNS: ED Triage Vitals  Enc Vitals Group     BP 02/06/16 2046 141/61 mmHg     Pulse Rate 02/06/16 2043 101     Resp 02/06/16 2046 17     Temp --      Temp src --      SpO2 02/06/16 2043 100 %   Constitutional: Somnolent. Would withdraw from pain and moan.  Eyes: Conjunctivae are normal. PERRL. Normal extraocular movements. ENT   Head: Normocephalic and atraumatic.   Nose: No congestion/rhinnorhea.   Mouth/Throat: Mucous membranes are moist.   Neck: No stridor. Hematological/Lymphatic/Immunilogical: No cervical lymphadenopathy. Cardiovascular: Bradycardic, irregular rhythm. No murmurs rubs or gallops appreciated.  Respiratory: Normal respiratory effort without tachypnea nor retractions. Breath sounds are clear and equal bilaterally. No wheezes/rales/rhonchi. Gastrointestinal: Soft and nontender. No distention.  Genitourinary: Deferred Musculoskeletal: Normal range of motion in all extremities. No joint effusions.  Neurologic:   Somnelent, would respond to painful stimuli and moan.  Skin:  Skin is warm, dry and intact. No rash noted.   ____________________________________________    LABS (pertinent positives/negatives)  Labs Reviewed  BASIC METABOLIC PANEL - Abnormal; Notable for the following:  Potassium 5.7 (*)    CO2 21 (*)    Glucose, Bld 340 (*)    Creatinine, Ser 1.92 (*)    GFR calc non Af Amer 24 (*)    GFR calc Af Amer 28 (*)    All other components within normal limits  CBC WITH DIFFERENTIAL/PLATELET - Abnormal; Notable for the following:    WBC 15.7 (*)    Hemoglobin 11.0 (*)    MCHC 31.3 (*)    RDW 17.9 (*)    Neutro Abs 12.7 (*)    All other components within normal limits  GLUCOSE, CAPILLARY - Abnormal; Notable for the following:    Glucose-Capillary 351 (*)    All other components within normal limits  TROPONIN I     ____________________________________________   EKG  12 lead from EMS consistent with complete heart block.   ____________________________________________    RADIOLOGY  CXR  IMPRESSION: Shallow inspiration with linear atelectasis in the lung bases. Correlation with prior study is limited due to overlying EKG patches  ____________________________________________   PROCEDURES  Procedure(s) performed: None  Critical Care performed: Yes, see critical care note(s)  CRITICAL CARE Performed by: Nance Pear   Total critical care time: 40 minutes  Critical care time was exclusive of separately billable procedures and treating other patients.  Critical care was necessary to treat or prevent imminent or life-threatening deterioration.  Critical care was time spent personally by me on the following activities: development of treatment plan with patient and/or surrogate as well as nursing, discussions with consultants, evaluation of patient's response to treatment, examination of patient, obtaining history from patient or surrogate, ordering and  performing treatments and interventions, ordering and review of laboratory studies, ordering and review of radiographic studies, pulse oximetry and re-evaluation of patient's condition.  ____________________________________________   INITIAL IMPRESSION / ASSESSMENT AND PLAN / ED COURSE  Pertinent labs & imaging results that were available during my care of the patient were reviewed by me and considered in my medical decision making (see chart for details).  Patient presented to the emergency department via EMS under emergency traffic. On initial arrival patient was responsive to painful stimuli only. Patient was significantly bradycardic. Twelve-lead by EMS was consistent with complete heart block with occasional junctional rhythm. The patient was then placed on pacer pads and transcutaneous pacing was initiated. The patient did respond well to transcutaneous pacing. She became more responsive and was verbally responsive. Additionally blood pressure did improve over EMSs initial readings. Unfortunately an EKG was not able to be performed prior to the pacing in initiated. I did ask the pacing be started given the acuity of the situation. The patient was written for aspirin. Blood work and chest x-ray were performed. Patient's potassium was minimally elevated however there was some homolysis. Think it unlikely this was the playing a role in the complete heart block. The patient was transferred to [place] where she will be under the care of cardiology.   ____________________________________________   FINAL CLINICAL IMPRESSION(S) / ED DIAGNOSES  Final diagnoses:  Complete heart block ([place])     Nance Pear, MD 02/06/16 2303

## 2016-02-06 NOTE — ED Notes (Addendum)
Pulses lost, CPR started.  Pads replaced, pulses returned.  MA 48, HR 70

## 2016-02-06 NOTE — ED Notes (Signed)
MD at bedside. 

## 2016-02-06 NOTE — ED Notes (Addendum)
Pt bib EMS, responsive to pain only.  Per EMS, pt was eating dinner w/ family when she "vomited all over herself and fell out".  Pt able to respond to pain on arrival.

## 2016-02-06 NOTE — ED Notes (Signed)
[facility] here for transport, medications handed off and pt switched to their monitor/zoll.  MA 50.

## 2016-02-07 ENCOUNTER — Encounter (HOSPITAL_COMMUNITY)
Admission: EM | Disposition: A | Discharge status: Home Health | Payer: Self-pay | Source: Other Acute Inpatient Hospital | Attending: Internal Medicine

## 2016-02-07 ENCOUNTER — Inpatient Hospital Stay (HOSPITAL_COMMUNITY): Payer: Medicare Other

## 2016-02-07 ENCOUNTER — Encounter (HOSPITAL_COMMUNITY): Payer: Self-pay | Admitting: *Deleted

## 2016-02-07 DIAGNOSIS — E440 Moderate protein-calorie malnutrition: Secondary | ICD-10-CM | POA: Diagnosis present

## 2016-02-07 DIAGNOSIS — I442 Atrioventricular block, complete: Secondary | ICD-10-CM | POA: Diagnosis present

## 2016-02-07 DIAGNOSIS — I10 Essential (primary) hypertension: Secondary | ICD-10-CM

## 2016-02-07 HISTORY — PX: EP IMPLANTABLE DEVICE: SHX172B

## 2016-02-07 LAB — APTT: APTT: 30 s (ref 24–37)

## 2016-02-07 LAB — CBC WITH DIFFERENTIAL/PLATELET
BASOS PCT: 0 %
Basophils Absolute: 0 10*3/uL (ref 0.0–0.1)
EOS ABS: 0 10*3/uL (ref 0.0–0.7)
Eosinophils Relative: 0 %
HCT: 33.6 % — ABNORMAL LOW (ref 36.0–46.0)
Hemoglobin: 10.4 g/dL — ABNORMAL LOW (ref 12.0–15.0)
Lymphocytes Relative: 7 %
Lymphs Abs: 1.3 10*3/uL (ref 0.7–4.0)
MCH: 27.4 pg (ref 26.0–34.0)
MCHC: 31 g/dL (ref 30.0–36.0)
MCV: 88.7 fL (ref 78.0–100.0)
MONO ABS: 1 10*3/uL (ref 0.1–1.0)
MONOS PCT: 6 %
NEUTROS PCT: 87 %
Neutro Abs: 15.1 10*3/uL — ABNORMAL HIGH (ref 1.7–7.7)
PLATELETS: 233 10*3/uL (ref 150–400)
RBC: 3.79 MIL/uL — ABNORMAL LOW (ref 3.87–5.11)
RDW: 16.6 % — AB (ref 11.5–15.5)
WBC: 17.4 10*3/uL — ABNORMAL HIGH (ref 4.0–10.5)

## 2016-02-07 LAB — COMPREHENSIVE METABOLIC PANEL
ALBUMIN: 2.4 g/dL — AB (ref 3.5–5.0)
ALK PHOS: 71 U/L (ref 38–126)
ALT: 21 U/L (ref 14–54)
ANION GAP: 14 (ref 5–15)
AST: 22 U/L (ref 15–41)
BUN: 20 mg/dL (ref 6–20)
CALCIUM: 8.5 mg/dL — AB (ref 8.9–10.3)
CO2: 24 mmol/L (ref 22–32)
Chloride: 99 mmol/L — ABNORMAL LOW (ref 101–111)
Creatinine, Ser: 1.8 mg/dL — ABNORMAL HIGH (ref 0.44–1.00)
GFR calc Af Amer: 30 mL/min — ABNORMAL LOW (ref >60)
GFR calc non Af Amer: 26 mL/min — ABNORMAL LOW (ref >60)
GLUCOSE: 127 mg/dL — AB (ref 65–99)
Potassium: 4.6 mmol/L (ref 3.5–5.1)
SODIUM: 137 mmol/L (ref 135–145)
Total Bilirubin: 0.3 mg/dL (ref 0.3–1.2)
Total Protein: 7 g/dL (ref 6.5–8.1)

## 2016-02-07 LAB — GLUCOSE, CAPILLARY
GLUCOSE-CAPILLARY: 94 mg/dL (ref 65–99)
GLUCOSE-CAPILLARY: 82 mg/dL (ref 65–99)
GLUCOSE-CAPILLARY: 79 mg/dL (ref 65–99)
Glucose-Capillary: 119 mg/dL — ABNORMAL HIGH (ref 65–99)
Glucose-Capillary: 140 mg/dL — ABNORMAL HIGH (ref 65–99)

## 2016-02-07 LAB — BASIC METABOLIC PANEL
Anion gap: 14 (ref 5–15)
BUN: 20 mg/dL (ref 6–20)
CO2: 25 mmol/L (ref 22–32)
CREATININE: 1.79 mg/dL — AB (ref 0.44–1.00)
Calcium: 8.5 mg/dL — ABNORMAL LOW (ref 8.9–10.3)
Chloride: 101 mmol/L (ref 101–111)
GFR calc Af Amer: 30 mL/min — ABNORMAL LOW (ref >60)
GFR, EST NON AFRICAN AMERICAN: 26 mL/min — AB (ref >60)
Glucose, Bld: 99 mg/dL (ref 65–99)
Potassium: 4.6 mmol/L (ref 3.5–5.1)
SODIUM: 140 mmol/L (ref 135–145)

## 2016-02-07 LAB — URINALYSIS, ROUTINE W REFLEX MICROSCOPIC
BILIRUBIN URINE: NEGATIVE
GLUCOSE, UA: 500 mg/dL — AB
Ketones, ur: NEGATIVE mg/dL
Nitrite: NEGATIVE
PH: 6.5 (ref 5.0–8.0)
Protein, ur: 30 mg/dL — AB
SPECIFIC GRAVITY, URINE: 1.021 (ref 1.005–1.030)

## 2016-02-07 LAB — URINE MICROSCOPIC-ADD ON

## 2016-02-07 LAB — TROPONIN I
TROPONIN I: 0.06 ng/mL — AB (ref <0.031)
TROPONIN I: 0.07 ng/mL — AB (ref <0.031)
Troponin I: 0.06 ng/mL — ABNORMAL HIGH (ref <0.031)

## 2016-02-07 LAB — LIPID PANEL
CHOLESTEROL: 153 mg/dL (ref 0–200)
HDL: 38 mg/dL — ABNORMAL LOW (ref >40)
LDL Cholesterol: 94 mg/dL (ref 0–99)
TRIGLYCERIDES: 106 mg/dL (ref <150)
Total CHOL/HDL Ratio: 4 RATIO
VLDL: 21 mg/dL (ref 0–40)

## 2016-02-07 LAB — CBC
HCT: 32.8 % — ABNORMAL LOW (ref 36.0–46.0)
HEMOGLOBIN: 10.1 g/dL — AB (ref 12.0–15.0)
MCH: 27.4 pg (ref 26.0–34.0)
MCHC: 30.8 g/dL (ref 30.0–36.0)
MCV: 89.1 fL (ref 78.0–100.0)
Platelets: 249 10*3/uL (ref 150–400)
RBC: 3.68 MIL/uL — AB (ref 3.87–5.11)
RDW: 16.7 % — ABNORMAL HIGH (ref 11.5–15.5)
WBC: 15.8 10*3/uL — ABNORMAL HIGH (ref 4.0–10.5)

## 2016-02-07 LAB — MAGNESIUM: Magnesium: 2.7 mg/dL — ABNORMAL HIGH (ref 1.7–2.4)

## 2016-02-07 LAB — MRSA PCR SCREENING: MRSA by PCR: NEGATIVE

## 2016-02-07 LAB — PROTIME-INR
INR: 1.11 (ref 0.00–1.49)
PROTHROMBIN TIME: 14.5 s (ref 11.6–15.2)

## 2016-02-07 LAB — BRAIN NATRIURETIC PEPTIDE: B NATRIURETIC PEPTIDE 5: 195.9 pg/mL — AB (ref 0.0–100.0)

## 2016-02-07 LAB — TSH: TSH: 0.773 u[IU]/mL (ref 0.350–4.500)

## 2016-02-07 SURGERY — PACEMAKER IMPLANT

## 2016-02-07 MED ORDER — ONDANSETRON HCL 4 MG PO TABS: 4.0000 mg | ORAL_TABLET | Freq: Three times a day (TID) | ORAL | Status: DC | PRN

## 2016-02-07 MED ORDER — CETYLPYRIDINIUM CHLORIDE 0.05 % MT LIQD
7.0000 mL | Freq: Two times a day (BID) | OROMUCOSAL | Status: DC
  Administered 2016-02-07: 7 mL via OROMUCOSAL

## 2016-02-07 MED ORDER — HEPARIN SODIUM (PORCINE) 5000 UNIT/ML IJ SOLN
5000.0000 [IU] | Freq: Three times a day (TID) | INTRAMUSCULAR | Status: DC
  Administered 2016-02-07 – 2016-02-09 (×6): 5000 [IU] via SUBCUTANEOUS
  Filled 2016-02-07 (×6): qty 1

## 2016-02-07 MED ORDER — ALBUTEROL SULFATE (2.5 MG/3ML) 0.083% IN NEBU: 3.0000 mL | INHALATION_SOLUTION | Freq: Four times a day (QID) | RESPIRATORY_TRACT | Status: DC | PRN

## 2016-02-07 MED ORDER — ASPIRIN 300 MG RE SUPP: 300.0000 mg | RECTAL | Status: AC

## 2016-02-07 MED ORDER — CHLORHEXIDINE GLUCONATE 4 % EX LIQD: 60.0000 mL | Freq: Once | CUTANEOUS | Status: AC

## 2016-02-07 MED ORDER — ASPIRIN 81 MG PO CHEW
324.0000 mg | CHEWABLE_TABLET | ORAL | Status: AC
  Administered 2016-02-07: 324 mg via ORAL
  Filled 2016-02-07: qty 4

## 2016-02-07 MED ORDER — CEFAZOLIN SODIUM 1-5 GM-% IV SOLN
1.0000 g | Freq: Two times a day (BID) | INTRAVENOUS | Status: AC
  Administered 2016-02-07 – 2016-02-08 (×2): 1 g via INTRAVENOUS
  Filled 2016-02-07 (×2): qty 50

## 2016-02-07 MED ORDER — MIDAZOLAM HCL 5 MG/5ML IJ SOLN
INTRAMUSCULAR | Status: DC | PRN
Start: 2016-02-07 — End: 2016-02-07
  Administered 2016-02-07: 2 mg via INTRAVENOUS
  Administered 2016-02-07: 1 mg via INTRAVENOUS

## 2016-02-07 MED ORDER — SODIUM CHLORIDE 0.9 % IV SOLN: INTRAVENOUS | Status: DC

## 2016-02-07 MED ORDER — SODIUM CHLORIDE 0.9 % IR SOLN
Status: AC
  Filled 2016-02-07: qty 2

## 2016-02-07 MED ORDER — CHLORHEXIDINE GLUCONATE 4 % EX LIQD
CUTANEOUS | Status: AC
  Administered 2016-02-07: 12:00:00
  Filled 2016-02-07: qty 15

## 2016-02-07 MED ORDER — SODIUM CHLORIDE 0.9 % IV SOLN
INTRAVENOUS | Status: DC
  Administered 2016-02-07: 02:00:00 via INTRAVENOUS

## 2016-02-07 MED ORDER — HEPARIN (PORCINE) IN NACL 2-0.9 UNIT/ML-% IJ SOLN
INTRAMUSCULAR | Status: AC
  Filled 2016-02-07: qty 500

## 2016-02-07 MED ORDER — SODIUM CHLORIDE 0.9% FLUSH: 3.0000 mL | INTRAVENOUS | Status: DC | PRN

## 2016-02-07 MED ORDER — ONDANSETRON HCL 4 MG/2ML IJ SOLN: 4.0000 mg | Freq: Four times a day (QID) | INTRAMUSCULAR | Status: DC | PRN

## 2016-02-07 MED ORDER — CETYLPYRIDINIUM CHLORIDE 0.05 % MT LIQD
7.0000 mL | Freq: Two times a day (BID) | OROMUCOSAL | Status: DC
  Administered 2016-02-07 – 2016-02-09 (×3): 7 mL via OROMUCOSAL

## 2016-02-07 MED ORDER — LIDOCAINE HCL (PF) 1 % IJ SOLN
INTRAMUSCULAR | Status: AC
  Filled 2016-02-07: qty 30

## 2016-02-07 MED ORDER — HEPARIN (PORCINE) IN NACL 2-0.9 UNIT/ML-% IJ SOLN
INTRAMUSCULAR | Status: DC | PRN
  Administered 2016-02-07: 13:00:00

## 2016-02-07 MED ORDER — CEFAZOLIN SODIUM-DEXTROSE 2-4 GM/100ML-% IV SOLN
INTRAVENOUS | Status: AC
  Filled 2016-02-07: qty 100

## 2016-02-07 MED ORDER — SODIUM CHLORIDE 0.9 % IV SOLN: 250.0000 mL | INTRAVENOUS | Status: DC | PRN

## 2016-02-07 MED ORDER — ASPIRIN EC 81 MG PO TBEC
81.0000 mg | DELAYED_RELEASE_TABLET | Freq: Every day | ORAL | Status: DC
  Administered 2016-02-08 – 2016-02-09 (×2): 81 mg via ORAL
  Filled 2016-02-07 (×2): qty 1

## 2016-02-07 MED ORDER — CEFAZOLIN SODIUM-DEXTROSE 2-4 GM/100ML-% IV SOLN
2.0000 g | INTRAVENOUS | Status: AC
  Administered 2016-02-07: 2 g via INTRAVENOUS

## 2016-02-07 MED ORDER — FENTANYL CITRATE (PF) 100 MCG/2ML IJ SOLN
INTRAMUSCULAR | Status: DC | PRN
  Administered 2016-02-07: 25 ug via INTRAVENOUS
  Administered 2016-02-07: 12.5 ug via INTRAVENOUS

## 2016-02-07 MED ORDER — SODIUM CHLORIDE 0.9 % IR SOLN
80.0000 mg | Status: AC
  Administered 2016-02-07: 80 mg

## 2016-02-07 MED ORDER — NITROGLYCERIN 0.4 MG SL SUBL: 0.4000 mg | SUBLINGUAL_TABLET | SUBLINGUAL | Status: DC | PRN

## 2016-02-07 MED ORDER — ONDANSETRON HCL 4 MG/2ML IJ SOLN
4.0000 mg | Freq: Four times a day (QID) | INTRAMUSCULAR | Status: DC | PRN
Start: 2016-02-07 — End: 2016-02-09
  Administered 2016-02-08: 4 mg via INTRAVENOUS
  Filled 2016-02-07: qty 2

## 2016-02-07 MED ORDER — MIDAZOLAM HCL 5 MG/5ML IJ SOLN
INTRAMUSCULAR | Status: AC
  Filled 2016-02-07: qty 5

## 2016-02-07 MED ORDER — LIDOCAINE HCL (PF) 1 % IJ SOLN
INTRAMUSCULAR | Status: DC | PRN
  Administered 2016-02-07: 45 mL via INTRADERMAL

## 2016-02-07 MED ORDER — PANTOPRAZOLE SODIUM 40 MG PO TBEC
40.0000 mg | DELAYED_RELEASE_TABLET | Freq: Two times a day (BID) | ORAL | Status: DC
  Administered 2016-02-07 – 2016-02-09 (×5): 40 mg via ORAL
  Filled 2016-02-07 (×5): qty 1

## 2016-02-07 MED ORDER — HYDROCODONE-ACETAMINOPHEN 5-325 MG PO TABS
1.0000 | ORAL_TABLET | Freq: Four times a day (QID) | ORAL | Status: DC | PRN
  Administered 2016-02-07 – 2016-02-09 (×3): 1 via ORAL
  Filled 2016-02-07 (×4): qty 1

## 2016-02-07 MED ORDER — SODIUM CHLORIDE 0.9% FLUSH
3.0000 mL | Freq: Two times a day (BID) | INTRAVENOUS | Status: DC
  Administered 2016-02-07 – 2016-02-08 (×4): 3 mL via INTRAVENOUS

## 2016-02-07 MED ORDER — FENTANYL CITRATE (PF) 100 MCG/2ML IJ SOLN
INTRAMUSCULAR | Status: AC
  Filled 2016-02-07: qty 2

## 2016-02-07 MED ORDER — ALPRAZOLAM 0.25 MG PO TABS
0.2500 mg | ORAL_TABLET | Freq: Every evening | ORAL | Status: DC | PRN
  Administered 2016-02-07: 0.25 mg via ORAL
  Filled 2016-02-07: qty 1

## 2016-02-07 MED ORDER — ASPIRIN 81 MG PO TABS: 81.0000 mg | ORAL_TABLET | Freq: Every day | ORAL | Status: DC

## 2016-02-07 MED ORDER — ATORVASTATIN CALCIUM 80 MG PO TABS
80.0000 mg | ORAL_TABLET | Freq: Every day | ORAL | Status: DC
  Administered 2016-02-07 – 2016-02-08 (×2): 80 mg via ORAL
  Filled 2016-02-07 (×2): qty 1

## 2016-02-07 SURGICAL SUPPLY — 8 items
CABLE SURGICAL S-101-97-12 (CABLE) ×2 IMPLANT
GUIDEWIRE ANGLED .035X150CM (WIRE) ×2 IMPLANT
LEAD TENDRIL MRI 46CM LPA1200M (Lead) ×2 IMPLANT
LEAD TENDRIL MRI 52CM LPA1200M (Lead) ×2 IMPLANT
PACEMAKER ASSURITY DR-RF (Pacemaker) ×2 IMPLANT
PAD DEFIB LIFELINK (PAD) ×2 IMPLANT
SHEATH CLASSIC 8F (SHEATH) ×4 IMPLANT
TRAY PACEMAKER INSERTION (PACKS) ×2 IMPLANT

## 2016-02-07 NOTE — H&P (View-Only) (Signed)
ELECTROPHYSIOLOGY CONSULT NOTE    Patient ID: [name] [name] MRN: [id number], DOB/AGE: 04-29-1936 80 y.o.  Admit date: 02/06/2016 Date of Consult: 02/07/2016  Primary Physician: Juanell Fairly, MD Primary Cardiologist: Rockey Situ Referring Physician: Claiborne Billings   Reason for Consultation: heart block   HPI:  [name] [name] is a 80 y.o. female with a past medical history significant for hypertension, hyperlipidemia, COPD, rheumatoid arthritis, CKD, and adenosine sensitive SVT that has been medically managed. She presented to the ER last night after a pre-syncopal event at home and was found to be in complete heart block with a ventricular rate in the 30's requiring transcutaneous pacing. She was transferred to [facility] for further evaluation. Her conduction returned and she has been in SR all night.  She has had prior dizziness but no frank syncope.  She also has chronic GI complaints of nausea and a 15 pound weight loss in the last 3 months.  She felt that this was originally related to changing diltiazem formulation.   Echo 07/2015 demonstrated normal EF, mild concentric hypertrophy, grade 1 diastolic dysfunction.  She currently feels much improved. She denies chest pain, shortness of breath, LE edema, recent fevers, chills.    EP has been asked to evaluate for treatment options.   Past Medical History  Diagnosis Date  . Hypertensive heart disease   . Hyperlipidemia   . COPD (chronic obstructive pulmonary disease) ([place])     a. With ongoing tobacco use.  Marland Kitchen GERD (gastroesophageal reflux disease)   . Anxiety   . Rheumatoid arthritis ([place])   . Osteoarthritis   . Hyperuricemia   . Chronic combined systolic and diastolic CHF, NYHA class 1 ([place])     a. 12/2013 Echo: EF 30-35%, DD, mid-distal anterior wall, apical and mid-apical inferior/posterior WMAs, mild MR/TR, normal RVSP;  b. 07/2015 Echo: EF nl, Gr1 DD, mild MR.  . CKD II - III   . Thyroid nodule   . Tobacco abuse   . S/P cardiac cath      a. 2008 Cath: nl cors.  . SVT (supraventricular tachycardia) ([place])     a. 12/2013 & 01/2014-->broke with adenosine;  b. 07/2015 Amio started.  . Pulmonary nodule, left     a.  H/O 1.7 cm nodule anterior medial base of the left lower lobe, not seen on 12/2013 CXR [name]).  . Collagen vascular disease ([place])   . Hypertension      Surgical History:  Past Surgical History  Procedure Laterality Date  . Bowel obstruction    . Tubal ligation    . Cardiac catheterization  2008    [facility]     Prescriptions prior to admission  Medication Sig Dispense Refill Last Dose  . amiodarone (PACERONE) 200 MG tablet Take 200 mg by mouth 2 (two) times daily.   02/06/2016 at Unknown time  . aspirin 81 MG tablet Take 81 mg by mouth daily.   02/06/2016 at Unknown time  . diltiazem (CARDIZEM CD) 120 MG 24 hr capsule Take 1 capsule (120 mg total) by mouth daily. 30 capsule 5 02/06/2016 at Unknown time  . LORazepam (ATIVAN) 0.5 MG tablet Take 0.5 mg by mouth every 8 (eight) hours as needed for anxiety.   Past Week at Unknown time  . mirtazapine (REMERON) 15 MG tablet Take 1 tablet (15 mg total) by mouth at bedtime. 30 tablet 6 02/06/2016 at Unknown time  . pantoprazole (PROTONIX) 40 MG tablet Take 40 mg by mouth 2 (two) times daily.   02/06/2016  at Unknown time  . predniSONE (DELTASONE) 10 MG tablet Take 10 mg by mouth daily.  0 02/06/2016 at Unknown time  . traMADol (ULTRAM) 50 MG tablet Take 50 mg by mouth every 6 (six) hours as needed for moderate pain.    Past Month at Unknown time  . albuterol (PROVENTIL HFA;VENTOLIN HFA) 108 (90 Base) MCG/ACT inhaler Inhale 2 puffs into the lungs every 6 (six) hours as needed for wheezing or shortness of breath. 1 Inhaler 6 unknown at unknown  . furosemide (LASIX) 20 MG tablet Take 1 tablet (20 mg total) by mouth 2 (two) times daily as needed for edema. 60 tablet 6 unknown at unknown  . ondansetron (ZOFRAN) 4 MG tablet Take 4 mg by mouth every 8 (eight) hours as needed for nausea or  vomiting.   unknown at unknown    Inpatient Medications:  . antiseptic oral rinse  7 mL Mouth Rinse BID  . [START ON 02/08/2016] aspirin EC  81 mg Oral Daily  . atorvastatin  80 mg Oral q1800  . heparin  5,000 Units Subcutaneous Q8H  . pantoprazole  40 mg Oral BID  . sodium chloride flush  3 mL Intravenous Q12H    Allergies:  Allergies  Allergen Reactions  . Benadryl [Diphenhydramine Hcl] Hives  . Lisinopril Cough  . Percocet [Oxycodone-Acetaminophen] Other (See Comments)    Reaction:  Dizziness   . Sulfa Antibiotics Hives    Social History   Social History  . Marital Status: Widowed    Spouse Name: N/A  . Number of Children: N/A  . Years of Education: N/A   Occupational History  . Not on file.   Social History Main Topics  . Smoking status: Current Every Day Smoker -- 0.50 packs/day for 50 years    Types: Cigarettes  . Smokeless tobacco: Never Used  . Alcohol Use: No  . Drug Use: No  . Sexual Activity: Not on file   Other Topics Concern  . Not on file   Social History Narrative   Widowed     Family History  Problem Relation Age of Onset  . Coronary artery disease Neg Hx     Early  . Stroke Neg Hx     CVA  . Hypertension Other   . Diabetes Other   . Hypertension Mother      Review of Systems: All other systems reviewed and are otherwise negative except as noted above.  Physical Exam: Filed Vitals:   02/07/16 0800 02/07/16 0826 02/07/16 0900 02/07/16 1000  BP: 123/62  124/60 116/67  Pulse: 71  63 69  Temp:  98.6 F (37 C)    TempSrc:  Oral    Resp: '12  10 16  '$ Height:      Weight:      SpO2: 100%  99% 99%    GEN- The patient is elderly and obese appearing, alert and oriented x 3 today.   HEENT: normocephalic, atraumatic; sclera clear, conjunctiva pink; hearing intact; oropharynx clear; neck supple  Lungs- Clear to ausculation bilaterally, normal work of breathing.  No wheezes, rales, rhonchi Heart- Regular rate and rhythm  GI- soft,  non-tender, non-distended, bowel sounds present  Extremities- no clubbing, cyanosis, or edema  MS- no significant deformity or atrophy Skin- warm and dry, no rash or lesion Psych- euthymic mood, full affect Neuro- strength and sensation are intact  Labs:   Lab Results  Component Value Date   WBC 15.8* 02/07/2016   HGB 10.1* 02/07/2016  HCT 32.8* 02/07/2016   MCV 89.1 02/07/2016   PLT 249 02/07/2016    Recent Labs Lab 02/07/16 0256 02/07/16 0801  NA 137 140  K 4.6 4.6  CL 99* 101  CO2 24 25  BUN 20 20  CREATININE 1.80* 1.79*  CALCIUM 8.5* 8.5*  PROT 7.0  --   BILITOT 0.3  --   ALKPHOS 71  --   ALT 21  --   AST 22  --   GLUCOSE 127* 99      Radiology/Studies:  Ct Head Wo Contrast 02/07/2016  CLINICAL DATA:  Initial evaluation for acute lightheadedness, loss of consciousness. EXAM: CT HEAD WITHOUT CONTRAST TECHNIQUE: Contiguous axial images were obtained from the base of the skull through the vertex without intravenous contrast. COMPARISON:  Prior CT from 01/14/2016. FINDINGS: Generalized cerebral atrophy with mild chronic small vessel ischemic disease present, stable from prior. Vascular calcifications present within the carotid siphons. No acute intracranial hemorrhage. No acute large vessel territory infarct. No mass lesion, midline shift, or mass effect. Mild ventricular prominence related global parenchymal volume loss without hydrocephalus. No extra-axial fluid collection. Scalp soft tissues within normal limits. No acute abnormality about the globes or orbits. Patient is status post bilateral cataract extraction. Paranasal sinuses are clear.  No mastoid effusion. Calvarium intact. IMPRESSION: 1. No acute intracranial process. 2. Generalized cerebral atrophy with mild chronic small vessel ischemic disease, stable. Electronically Signed   By: Jeannine Boga M.D.   On: 02/07/2016 03:43   WFU:XNATF rhythm, rate 78, bifasicular block   TELEMETRY: sinus  rhythm  Assessment/Plan: 1.  Tachy/brady syndrome The patient has documented tachycardia requiring medical therapy for treatment. She presented to the ER in complete heart block requiring transcutaneous pacing.  She also has underlying conduction system disease with bifasicular block. She meets indication for pacemaker implantation. Risks, benefits discussed with the patient and her family who wish to proceed. Will plan with Dr Lovena Le later today.  2.  Nausea/weight loss Will ask GI to see  LFT's normal   3.  HTN Stable No change required today   Signed, Chanetta Marshall, NP 02/07/2016 10:34 AM  EP Attending  Patient seen and examined. Agree with above. The patient has symptomatic SVT and has been controlled with amiodarone and Calcium channel blocker. She presented with hemodynamically unstable complete heart block which responded to transcutaneous pacing. Her CHB has subsided but she has severe conduction system disease with underlying RBBB and LAFB. I have discussed the treatment options with the patient and her family. She does not want an ablation and even if we stopped her amio and cardizem, would likely need PPM sooner than later with her underlying conduction system disease. Will plan to proceed with PPM insertion. I have examined the patient and she has RRR and clear lungs and no edema. Neuro exam is normal. ECG as above.  A/P 1. Complete heart block 2. SVT 3. Chronic amio for number #2. Will plan to proceed with DDD PM insertion. The risks/benefits/goals/expectations of the procedure have been discussed with the patient and she wishes to proceed.  Mikle Bosworth.D.

## 2016-02-07 NOTE — Interval H&P Note (Signed)
History and Physical Interval Note:  02/07/2016 12:12 PM  [name] [name]  has presented today for surgery, with the diagnosis of hb  The various methods of treatment have been discussed with the patient and family. After consideration of risks, benefits and other options for treatment, the patient has consented to  Procedure(s): Pacemaker Implant (N/A) as a surgical intervention .  The patient's history has been reviewed, patient examined, no change in status, stable for surgery.  I have reviewed the patient's chart and labs.  Questions were answered to the patient's satisfaction.     Cristopher Peru

## 2016-02-07 NOTE — H&P (Addendum)
[name] [name] is an 80 y.o. female.    Chief Complaint: loss of consciousness, complete heart block Primary Cardiologist: Dr. Rockey Situ HPI: [name] [name] is a 80 yo woman with PMH of ongoing tobacco abuse, NSTEMI April 2015 with troponin to 0.23 2/2 SVT presumed, normal coronary arteries in 2008, combined chronic systolic and diastolic heart failure with EF recovery, hypertension, COPD, dyslipidemia, rheumatoid arthritis, T2DM, GERD who has had multiple ER visits, loss of appetite, weight loss, abnormal chest x-ray findings fairly recent SVT managed with medications (he has not wanted ablation) who has had worse balance issues and development of nausea/vomiting frothy pink material and loss of consciousness today. She was found to have complete heart block at [facility], received brief period of transcutaneous pacing and transferred to [facility] for possible pacemaker. On arrival, she is sleepy and has some mild soreness in her chest but she is in sinus rhythm. Her family described her general decline over the past several months. Of note, she's had weight loss, poor taste, metallic taste in her month, abnormal chest x-rays, multiple visits to the ER and general malaise.    Past Medical History  Diagnosis Date  . Hypertensive heart disease   . Hyperlipidemia   . COPD (chronic obstructive pulmonary disease) ([place])     a. With ongoing tobacco use.  Marland Kitchen GERD (gastroesophageal reflux disease)   . Anxiety   . Rheumatoid arthritis ([place])   . Osteoarthritis   . Hyperuricemia   . Chronic combined systolic and diastolic CHF, NYHA class 1 ([place])     a. 12/2013 Echo: EF 30-35%, DD, mid-distal anterior wall, apical and mid-apical inferior/posterior WMAs, mild MR/TR, normal RVSP;  b. 07/2015 Echo: EF nl, Gr1 DD, mild MR.  . CKD II - III   . Thyroid nodule   . Tobacco abuse   . S/P cardiac cath     a. 2008 Cath: nl cors.  . SVT (supraventricular tachycardia) ([place])     a. 12/2013 & 01/2014-->broke with  adenosine;  b. 07/2015 Amio started.  . Pulmonary nodule, left     a.  H/O 1.7 cm nodule anterior medial base of the left lower lobe, not seen on 12/2013 CXR [facility]).  . Collagen vascular disease ([place])   . Hypertension     Past Surgical History  Procedure Laterality Date  . Bowel obstruction    . Tubal ligation    . Cardiac catheterization  2008    [facility]    Family History  Problem Relation Age of Onset  . Coronary artery disease Neg Hx     Early  . Stroke Neg Hx     CVA  . Hypertension Other   . Diabetes Other   . Hypertension Mother    Social History:  reports that she has been smoking Cigarettes.  She has a 25 pack-year smoking history. She has never used smokeless tobacco. She reports that she does not drink alcohol or use illicit drugs.  Allergies:  Allergies  Allergen Reactions  . Benadryl [Diphenhydramine Hcl] Hives  . Lisinopril Cough  . Percocet [Oxycodone-Acetaminophen] Other (See Comments)    Reaction:  Dizziness   . Sulfa Antibiotics Hives    Medications Prior to Admission  Medication Sig Dispense Refill  . albuterol (PROVENTIL HFA;VENTOLIN HFA) 108 (90 Base) MCG/ACT inhaler Inhale 2 puffs into the lungs every 6 (six) hours as needed for wheezing or shortness of breath. 1 Inhaler 6  . amiodarone (PACERONE) 200 MG tablet Take 200 mg by mouth 2 (  two) times daily.    Marland Kitchen aspirin 81 MG tablet Take 81 mg by mouth daily.    Marland Kitchen diltiazem (CARDIZEM CD) 120 MG 24 hr capsule Take 1 capsule (120 mg total) by mouth daily. 30 capsule 5  . furosemide (LASIX) 20 MG tablet Take 1 tablet (20 mg total) by mouth 2 (two) times daily as needed for edema. 60 tablet 6  . LORazepam (ATIVAN) 0.5 MG tablet Take 0.5 mg by mouth every 8 (eight) hours as needed for anxiety.    . mirtazapine (REMERON) 15 MG tablet Take 1 tablet (15 mg total) by mouth at bedtime. 30 tablet 6  . ondansetron (ZOFRAN ODT) 4 MG disintegrating tablet Take 1 tablet (4 mg total) by mouth every 8 (eight) hours as needed  for nausea or vomiting. 20 tablet 0  . ondansetron (ZOFRAN) 4 MG tablet Take 4 mg by mouth every 8 (eight) hours as needed for nausea or vomiting.    . pantoprazole (PROTONIX) 40 MG tablet Take 40 mg by mouth 2 (two) times daily.    . predniSONE (DELTASONE) 10 MG tablet Take 10 mg by mouth daily.  0  . traMADol (ULTRAM) 50 MG tablet Take 50 mg by mouth every 6 (six) hours as needed for moderate pain.       Results for orders placed or performed during the hospital encounter of 02/06/16 (from the past 48 hour(s))  Glucose, capillary     Status: Abnormal   Collection Time: 02/06/16  8:47 PM  Result Value Ref Range   Glucose-Capillary 351 (H) 65 - 99 mg/dL  Troponin I     Status: None   Collection Time: 02/06/16  8:50 PM  Result Value Ref Range   Troponin I 0.03 <0.031 ng/mL    Comment:        NO INDICATION OF MYOCARDIAL INJURY.   Basic metabolic panel     Status: Abnormal   Collection Time: 02/06/16  8:50 PM  Result Value Ref Range   Sodium 135 135 - 145 mmol/L   Potassium 5.7 (H) 3.5 - 5.1 mmol/L    Comment: HEMOLYSIS AT THIS LEVEL MAY AFFECT RESULT   Chloride 102 101 - 111 mmol/L   CO2 21 (L) 22 - 32 mmol/L   Glucose, Bld 340 (H) 65 - 99 mg/dL   BUN 20 6 - 20 mg/dL   Creatinine, Ser 1.92 (H) 0.44 - 1.00 mg/dL   Calcium 8.9 8.9 - 10.3 mg/dL   GFR calc non Af Amer 24 (L) >60 mL/min   GFR calc Af Amer 28 (L) >60 mL/min    Comment: (NOTE) The eGFR has been calculated using the CKD EPI equation. This calculation has not been validated in all clinical situations. eGFR's persistently <60 mL/min signify possible Chronic Kidney Disease.    Anion gap 12 5 - 15  CBC with Differential     Status: Abnormal   Collection Time: 02/06/16  8:50 PM  Result Value Ref Range   WBC 15.7 (H) 3.6 - 11.0 K/uL   RBC 4.07 3.80 - 5.20 MIL/uL   Hemoglobin 11.0 (L) 12.0 - 16.0 g/dL   HCT 35.3 35.0 - 47.0 %   MCV 86.7 80.0 - 100.0 fL   MCH 27.2 26.0 - 34.0 pg   MCHC 31.3 (L) 32.0 - 36.0 g/dL   RDW  17.9 (H) 11.5 - 14.5 %   Platelets 294 150 - 440 K/uL   Neutrophils Relative % 81% %   Neutro Abs 12.7 (H) 1.4 -  6.5 K/uL   Lymphocytes Relative 15% %   Lymphs Abs 2.3 1.0 - 3.6 K/uL   Monocytes Relative 4% %   Monocytes Absolute 0.7 0.2 - 0.9 K/uL   Eosinophils Relative 0% %   Eosinophils Absolute 0.0 0 - 0.7 K/uL   Basophils Relative 0% %   Basophils Absolute 0.0 0 - 0.1 K/uL   Dg Chest Portable 1 View  02/06/2016  CLINICAL DATA:  Altered mental status. Syncope and vomiting at dinner tonight, PT is s/p code and CPR, verbal at this time, but confused EXAM: PORTABLE CHEST 1 VIEW COMPARISON:  01/14/2016 FINDINGS: Normal heart size and pulmonary vascularity. Shallow inspiration with linear atelectasis in the lung bases. No definite consolidation. The area of previous consolidation in the left lower lobe is today obscured by overlying EKG patches and I cannot tell whether it remains or has cleared. No blunting of costophrenic angles. No pneumothorax. Tortuous aorta. IMPRESSION: Shallow inspiration with linear atelectasis in the lung bases. Correlation with prior study is limited due to overlying EKG patches. Electronically Signed   By: Lucienne Capers M.D.   On: 02/06/2016 21:58    Review of Systems  Constitutional: Positive for weight loss and malaise/fatigue. Negative for fever and chills.  HENT: Negative for ear discharge, ear pain and tinnitus.   Respiratory: Positive for shortness of breath. Negative for cough.   Cardiovascular: Positive for chest pain. Negative for palpitations and orthopnea.  Gastrointestinal: Positive for nausea and vomiting. Negative for diarrhea, constipation and blood in stool.  Musculoskeletal: Positive for myalgias, back pain and falls.  Skin: Negative for rash.  Neurological: Positive for dizziness, loss of consciousness and weakness. Negative for tingling, tremors and speech change.  Endo/Heme/Allergies: Negative for polydipsia.  Psychiatric/Behavioral:  Negative for depression, suicidal ideas and substance abuse.    Blood pressure 132/77, pulse 77, temperature 98.1 F (36.7 C), resp. rate 13, height 5' 6"  (1.676 m), weight 83.1 kg (183 lb 3.2 oz), SpO2 100 %. Physical Exam  Nursing note and vitals reviewed. Constitutional: She appears well-developed and well-nourished. No distress.  Appears sleepy but easily arousable  HENT:  Head: Normocephalic and atraumatic.  Nose: Nose normal.  Mouth/Throat: Oropharynx is clear and moist. No oropharyngeal exudate.  Eyes: Conjunctivae and EOM are normal. Pupils are equal, round, and reactive to light. No scleral icterus.  Neck: Normal range of motion. No JVD present. No tracheal deviation present.  Cardiovascular: Normal rate, regular rhythm, normal heart sounds and intact distal pulses.  Exam reveals no gallop.   No murmur heard. Respiratory: Effort normal and breath sounds normal. No respiratory distress. She has no wheezes. She has no rales.  GI: Soft. Bowel sounds are normal. She exhibits no distension. There is no tenderness. There is no rebound.  Musculoskeletal: Normal range of motion.  Trace edema bilaterally  Neurological: She is alert. No cranial nerve deficit.  Oriented to person, hospital [facility])  Skin: Skin is warm and dry. No rash noted. She is not diaphoretic. No erythema.  Psychiatric: She has a normal mood and affect. Her behavior is normal.   na 135, K 5.7 (report of hemolysis), bun/cr 20/1.92, trop 0.03, wbc 15.7, glucose 351 Echo 11/16: echo with EF preserved, grade I DD, mild MR Assessment/Plan Ms. Corey is a 80 yo woman with PMH of ongoing tobacco abuse, combined chronic systolic and diastolic heart failure with EF recovery, hypertension, COPD, dyslipidemia, rheumatoid arthritis, T2DM, GERD, multiple recent ER visits, SVT managed with medications and weight loss who presented with  loss of consciousness, presumed complete heart block temporarily requiring pacing and  spitting up frothy, pink tinged sputum. Differential is broad for symptoms. Medications (CCB and amiodarone) certainly could be contributing to her complete heart block as well as progressive conduction system disease or ischemia or infection. We will monitor in ICU, telemetry and trend cardiac biomarkers and hold these medications and discuss with EP. Regarding weight loss, spitting up frothy sputum and weight loss with poor appetite and ? Dysphagia we will consult with GI. Regarding lung nodules we will discuss with pulmonary. Family updated of plans to evaluate multiple ongoing issues.  Problem List 1. Complete heart block - temporarily requiring transcutaneous pacing 2. Leukocytosis 3. Weight loss 4. Nausea/Vomiting 5. Combined chronic systolic and diastolic heart failure 6. Acute renal failure 7. Hyperkalemia vs. Hemolysis of labs 8. T2DM 9. COPD 10. Tobacco abuse 11. SVT managed with diltiazem and amiodarone 2/2 patient not interested in ablation per notes 12. Abnormal pulmonary nodule Plan 1. Admit to ICU, trend cardiac biomarkers, holding amiodarone, diltiazem 2. Consult GI for weight loss, poor appetite, vomiting pink material 3. Consult with pulmonary regarding repeat imaging needs for abnormal chest x-ray/CT, prior pulmonary nodule 4. Keep pacing pads on patients 5. Gentle IV fluid x 500 ml given acute renal failure, obtain urinalysis 6. TSH, BNP, blood cultures, sputum cultures, urinalysis 7. Daily smoking cessation counseling provided 8. Watch blood glucoses 9. CTH without contrast to evaluate LOC, dizziness and previous fall  Jules Husbands, MD 02/07/2016, 1:37 AM

## 2016-02-07 NOTE — Consult Note (Signed)
ELECTROPHYSIOLOGY CONSULT NOTE    Patient ID: [name] [name] MRN: [id number], DOB/AGE: [birth year] 80 y.o.  Admit date: 02/06/2016 Date of Consult: 02/07/2016  Primary Physician: Juanell Fairly, MD Primary Cardiologist: Rockey Situ Referring Physician: Claiborne Billings   Reason for Consultation: heart block   HPI:  [name] [name] is a 80 y.o. female with a past medical history significant for hypertension, hyperlipidemia, COPD, rheumatoid arthritis, CKD, and adenosine sensitive SVT that has been medically managed. She presented to the ER last night after a pre-syncopal event at home and was found to be in complete heart block with a ventricular rate in the 30's requiring transcutaneous pacing. She was transferred to [facility] for further evaluation. Her conduction returned and she has been in SR all night.  She has had prior dizziness but no frank syncope.  She also has chronic GI complaints of nausea and a 15 pound weight loss in the last 3 months.  She felt that this was originally related to changing diltiazem formulation.   Echo 07/2015 demonstrated normal EF, mild concentric hypertrophy, grade 1 diastolic dysfunction.  She currently feels much improved. She denies chest pain, shortness of breath, LE edema, recent fevers, chills.    EP has been asked to evaluate for treatment options.   Past Medical History  Diagnosis Date  . Hypertensive heart disease   . Hyperlipidemia   . COPD (chronic obstructive pulmonary disease) ([place])     a. With ongoing tobacco use.  Marland Kitchen GERD (gastroesophageal reflux disease)   . Anxiety   . Rheumatoid arthritis ([place])   . Osteoarthritis   . Hyperuricemia   . Chronic combined systolic and diastolic CHF, NYHA class 1 ([place])     a. 12/2013 Echo: EF 30-35%, DD, mid-distal anterior wall, apical and mid-apical inferior/posterior WMAs, mild MR/TR, normal RVSP;  b. 07/2015 Echo: EF nl, Gr1 DD, mild MR.  . CKD II - III   . Thyroid nodule   . Tobacco abuse   . S/P cardiac cath      a. 2008 Cath: nl cors.  . SVT (supraventricular tachycardia) ([place])     a. 12/2013 & 01/2014-->broke with adenosine;  b. 07/2015 Amio started.  . Pulmonary nodule, left     a.  H/O 1.7 cm nodule anterior medial base of the left lower lobe, not seen on 12/2013 CXR [facility]).  . Collagen vascular disease ([place])   . Hypertension      Surgical History:  Past Surgical History  Procedure Laterality Date  . Bowel obstruction    . Tubal ligation    . Cardiac catheterization  2008    [facility]     Prescriptions prior to admission  Medication Sig Dispense Refill Last Dose  . amiodarone (PACERONE) 200 MG tablet Take 200 mg by mouth 2 (two) times daily.   02/06/2016 at Unknown time  . aspirin 81 MG tablet Take 81 mg by mouth daily.   02/06/2016 at Unknown time  . diltiazem (CARDIZEM CD) 120 MG 24 hr capsule Take 1 capsule (120 mg total) by mouth daily. 30 capsule 5 02/06/2016 at Unknown time  . LORazepam (ATIVAN) 0.5 MG tablet Take 0.5 mg by mouth every 8 (eight) hours as needed for anxiety.   Past Week at Unknown time  . mirtazapine (REMERON) 15 MG tablet Take 1 tablet (15 mg total) by mouth at bedtime. 30 tablet 6 02/06/2016 at Unknown time  . pantoprazole (PROTONIX) 40 MG tablet Take 40 mg by mouth 2 (two) times daily.   02/06/2016  at Unknown time  . predniSONE (DELTASONE) 10 MG tablet Take 10 mg by mouth daily.  0 02/06/2016 at Unknown time  . traMADol (ULTRAM) 50 MG tablet Take 50 mg by mouth every 6 (six) hours as needed for moderate pain.    Past Month at Unknown time  . albuterol (PROVENTIL HFA;VENTOLIN HFA) 108 (90 Base) MCG/ACT inhaler Inhale 2 puffs into the lungs every 6 (six) hours as needed for wheezing or shortness of breath. 1 Inhaler 6 unknown at unknown  . furosemide (LASIX) 20 MG tablet Take 1 tablet (20 mg total) by mouth 2 (two) times daily as needed for edema. 60 tablet 6 unknown at unknown  . ondansetron (ZOFRAN) 4 MG tablet Take 4 mg by mouth every 8 (eight) hours as needed for nausea or  vomiting.   unknown at unknown    Inpatient Medications:  . antiseptic oral rinse  7 mL Mouth Rinse BID  . [START ON 02/08/2016] aspirin EC  81 mg Oral Daily  . atorvastatin  80 mg Oral q1800  . heparin  5,000 Units Subcutaneous Q8H  . pantoprazole  40 mg Oral BID  . sodium chloride flush  3 mL Intravenous Q12H    Allergies:  Allergies  Allergen Reactions  . Benadryl [Diphenhydramine Hcl] Hives  . Lisinopril Cough  . Percocet [Oxycodone-Acetaminophen] Other (See Comments)    Reaction:  Dizziness   . Sulfa Antibiotics Hives    Social History   Social History  . Marital Status: Widowed    Spouse Name: N/A  . Number of Children: N/A  . Years of Education: N/A   Occupational History  . Not on file.   Social History Main Topics  . Smoking status: Current Every Day Smoker -- 0.50 packs/day for 50 years    Types: Cigarettes  . Smokeless tobacco: Never Used  . Alcohol Use: No  . Drug Use: No  . Sexual Activity: Not on file   Other Topics Concern  . Not on file   Social History Narrative   Widowed     Family History  Problem Relation Age of Onset  . Coronary artery disease Neg Hx     Early  . Stroke Neg Hx     CVA  . Hypertension Other   . Diabetes Other   . Hypertension Mother      Review of Systems: All other systems reviewed and are otherwise negative except as noted above.  Physical Exam: Filed Vitals:   02/07/16 0800 02/07/16 0826 02/07/16 0900 02/07/16 1000  BP: 123/62  124/60 116/67  Pulse: 71  63 69  Temp:  98.6 F (37 C)    TempSrc:  Oral    Resp: '12  10 16  '$ Height:      Weight:      SpO2: 100%  99% 99%    GEN- The patient is elderly and obese appearing, alert and oriented x 3 today.   HEENT: normocephalic, atraumatic; sclera clear, conjunctiva pink; hearing intact; oropharynx clear; neck supple  Lungs- Clear to ausculation bilaterally, normal work of breathing.  No wheezes, rales, rhonchi Heart- Regular rate and rhythm  GI- soft,  non-tender, non-distended, bowel sounds present  Extremities- no clubbing, cyanosis, or edema  MS- no significant deformity or atrophy Skin- warm and dry, no rash or lesion Psych- euthymic mood, full affect Neuro- strength and sensation are intact  Labs:   Lab Results  Component Value Date   WBC 15.8* 02/07/2016   HGB 10.1* 02/07/2016  HCT 32.8* 02/07/2016   MCV 89.1 02/07/2016   PLT 249 02/07/2016    Recent Labs Lab 02/07/16 0256 02/07/16 0801  NA 137 140  K 4.6 4.6  CL 99* 101  CO2 24 25  BUN 20 20  CREATININE 1.80* 1.79*  CALCIUM 8.5* 8.5*  PROT 7.0  --   BILITOT 0.3  --   ALKPHOS 71  --   ALT 21  --   AST 22  --   GLUCOSE 127* 99      Radiology/Studies:  Ct Head Wo Contrast 02/07/2016  CLINICAL DATA:  Initial evaluation for acute lightheadedness, loss of consciousness. EXAM: CT HEAD WITHOUT CONTRAST TECHNIQUE: Contiguous axial images were obtained from the base of the skull through the vertex without intravenous contrast. COMPARISON:  Prior CT from 01/14/2016. FINDINGS: Generalized cerebral atrophy with mild chronic small vessel ischemic disease present, stable from prior. Vascular calcifications present within the carotid siphons. No acute intracranial hemorrhage. No acute large vessel territory infarct. No mass lesion, midline shift, or mass effect. Mild ventricular prominence related global parenchymal volume loss without hydrocephalus. No extra-axial fluid collection. Scalp soft tissues within normal limits. No acute abnormality about the globes or orbits. Patient is status post bilateral cataract extraction. Paranasal sinuses are clear.  No mastoid effusion. Calvarium intact. IMPRESSION: 1. No acute intracranial process. 2. Generalized cerebral atrophy with mild chronic small vessel ischemic disease, stable. Electronically Signed   By: Jeannine Boga M.D.   On: 02/07/2016 03:43   KDT:OIZTI rhythm, rate 78, bifasicular block   TELEMETRY: sinus  rhythm  Assessment/Plan: 1.  Tachy/brady syndrome The patient has documented tachycardia requiring medical therapy for treatment. She presented to the ER in complete heart block requiring transcutaneous pacing.  She also has underlying conduction system disease with bifasicular block. She meets indication for pacemaker implantation. Risks, benefits discussed with the patient and her family who wish to proceed. Will plan with Dr Lovena Le later today.  2.  Nausea/weight loss Will ask GI to see  LFT's normal   3.  HTN Stable No change required today   Signed, Chanetta Marshall, NP 02/07/2016 10:34 AM  EP Attending  Patient seen and examined. Agree with above. The patient has symptomatic SVT and has been controlled with amiodarone and Calcium channel blocker. She presented with hemodynamically unstable complete heart block which responded to transcutaneous pacing. Her CHB has subsided but she has severe conduction system disease with underlying RBBB and LAFB. I have discussed the treatment options with the patient and her family. She does not want an ablation and even if we stopped her amio and cardizem, would likely need PPM sooner than later with her underlying conduction system disease. Will plan to proceed with PPM insertion. I have examined the patient and she has RRR and clear lungs and no edema. Neuro exam is normal. ECG as above.  A/P 1. Complete heart block 2. SVT 3. Chronic amio for number #2. Will plan to proceed with DDD PM insertion. The risks/benefits/goals/expectations of the procedure have been discussed with the patient and she wishes to proceed.  Mikle Bosworth.D.

## 2016-02-07 NOTE — Plan of Care (Signed)
Problem: Education: Goal: Knowledge of [facility] Education information/materials will improve Outcome: Completed/Met Date Met:  02/07/16 Pt has been educated throughout hospitalization regarding [facility] policy, pain management, tests, procedures, and medications   Problem: Safety: Goal: Ability to remain free from injury will improve Outcome: Completed/Met Date Met:  02/07/16 Pt remains free from injury during this admission   Problem: Pain Managment: Goal: General experience of comfort will improve Outcome: Completed/Met Date Met:  02/07/16 Pt remains pain free   Problem: Consults Goal: Permanent Pacemaker Patient Education (See Patient Education module for education specifics.) Outcome: Completed/Met Date Met:  02/07/16 Pt received education regarding pacer placement   Problem: Phase II Progression Outcomes Goal: Pacer site without bleeding/hematoma Outcome: Completed/Met Date Met:  02/07/16 Pacer site is free from bleeding and hematoma  Goal: No evidence of pacemaker malfunction Outcome: Completed/Met Date Met:  02/07/16 No evidence of pacemaker malfunction  Goal: Pain controlled Outcome: Completed/Met Date Met:  02/07/16 Pt remains free from pain   Problem: Phase III Progression Outcomes Goal: Pain controlled on oral analgesia Outcome: Completed/Met Date Met:  02/07/16 Pt remains free from pain  Goal: Tolerating diet Outcome: Completed/Met Date Met:  02/07/16 Pt able to tolerate current diet

## 2016-02-07 NOTE — Progress Notes (Signed)
Initial Nutrition Assessment  DOCUMENTATION CODES:   Non-severe (moderate) malnutrition in context of chronic illness  INTERVENTION:  -When diet advanced, provide Ensure Enlive BID. Each supplement will provide 350 kcals and 20 grams of protein.  NUTRITION DIAGNOSIS:   Malnutrition related to chronic illness as evidenced by percent weight loss, mild depletion of muscle mass (4.7% in 3 weeks).  GOAL:   Patient will meet greater than or equal to 90% of their needs  MONITOR:   PO intake, Supplement acceptance, Labs, Weight trends, Diet advancement, Skin, I & O's  REASON FOR ASSESSMENT:   Consult Poor PO  ASSESSMENT:   Pt with PMH of ongoing tobacco abuse, combined chronic systolic and diastolic heart failure with EF recovery, hypertension, COPD, dyslipidemia, rheumatoid arthritis, T2DM, GERD, multiple recent ER visits, SVT managed with medications and weight loss who presented with loss of consciousness.  Pt reports poor appetite and altered taste since February.  Pt drinks Ensure TID PTA.  Pt denies N/V.  Intern discussed foods foods that may be amenable. Pt states she could eat cold/bland foods and ensure (soft fruit, ensure, crackers).  Will order Ensure Enlive TID when diet is advanced.  Per chart, pt vomited pink material/sputum. GI consulted.  Weight loss 4.7% in past 3 weeks which is significant for time frame.  NFPE: Mild muscle depletion in clavicles, temples, and anterior thighs, no fat depletion, mild edema.   Labs reviewed; creat 1.79, Ca 8.5, GFR 30. Meds reviewed.  Diet Order:  Diet Heart Room service appropriate?: Yes; Fluid consistency:: Thin  Skin:  Reviewed, no issues  Last BM:  5/17  Height:   Ht Readings from Last 1 Encounters:  02/07/16 '5\' 6"'$  (1.676 m)    Weight:   Wt Readings from Last 1 Encounters:  02/07/16 183 lb 13.8 oz (83.4 kg)    Ideal Body Weight:  59.1 kg  BMI:  Body mass index is 29.69 kg/(m^2).  Estimated Nutritional  Needs:   Kcal:  1550-1750 kcals  Protein:  90-100 g  Fluid:  1.5-1.7 L  EDUCATION NEEDS:   No education needs identified at this time  Geoffery Lyons, [place] Dietetic Intern Pager [phone]

## 2016-02-08 ENCOUNTER — Inpatient Hospital Stay (HOSPITAL_COMMUNITY): Payer: Medicare Other

## 2016-02-08 DIAGNOSIS — J432 Centrilobular emphysema: Secondary | ICD-10-CM

## 2016-02-08 DIAGNOSIS — I442 Atrioventricular block, complete: Principal | ICD-10-CM

## 2016-02-08 DIAGNOSIS — R918 Other nonspecific abnormal finding of lung field: Secondary | ICD-10-CM

## 2016-02-08 LAB — GLUCOSE, CAPILLARY
GLUCOSE-CAPILLARY: 124 mg/dL — AB (ref 65–99)
GLUCOSE-CAPILLARY: 123 mg/dL — AB (ref 65–99)
GLUCOSE-CAPILLARY: 234 mg/dL — AB (ref 65–99)
Glucose-Capillary: 172 mg/dL — ABNORMAL HIGH (ref 65–99)

## 2016-02-08 LAB — HEMOGLOBIN A1C
Hgb A1c MFr Bld: 8.4 % — ABNORMAL HIGH (ref 4.8–5.6)
Mean Plasma Glucose: 194 mg/dL

## 2016-02-08 LAB — LACTIC ACID, PLASMA
LACTIC ACID, VENOUS: 1.5 mmol/L (ref 0.5–2.0)
LACTIC ACID, VENOUS: 1.4 mmol/L (ref 0.5–2.0)

## 2016-02-08 MED ORDER — AMIODARONE HCL 200 MG PO TABS
200.0000 mg | ORAL_TABLET | Freq: Every day | ORAL | Status: DC
  Administered 2016-02-08 – 2016-02-09 (×2): 200 mg via ORAL
  Filled 2016-02-08 (×2): qty 1

## 2016-02-08 MED ORDER — ALUM & MAG HYDROXIDE-SIMETH 200-200-20 MG/5ML PO SUSP
30.0000 mL | Freq: Three times a day (TID) | ORAL | Status: DC
  Administered 2016-02-08 – 2016-02-09 (×2): 30 mL via ORAL
  Filled 2016-02-08 (×3): qty 30

## 2016-02-08 MED ORDER — SODIUM CHLORIDE 0.9 % IV BOLUS (SEPSIS)
500.0000 mL | Freq: Once | INTRAVENOUS | Status: AC
Start: 2016-02-08 — End: 2016-02-08
  Administered 2016-02-08: 500 mL via INTRAVENOUS

## 2016-02-08 MED ORDER — WHITE PETROLATUM GEL
Status: AC
  Administered 2016-02-08: 0.2
  Filled 2016-02-08: qty 1

## 2016-02-08 MED ORDER — SODIUM CHLORIDE 0.9 % IV SOLN
INTRAVENOUS | Status: DC
  Administered 2016-02-08 – 2016-02-09 (×2): via INTRAVENOUS

## 2016-02-08 MED ORDER — HYDROCORTISONE NA SUCCINATE PF 100 MG IJ SOLR
50.0000 mg | Freq: Two times a day (BID) | INTRAMUSCULAR | Status: DC
  Administered 2016-02-08 – 2016-02-09 (×2): 50 mg via INTRAVENOUS
  Filled 2016-02-08 (×2): qty 2

## 2016-02-08 MED ORDER — DILTIAZEM HCL ER COATED BEADS 240 MG PO CP24
240.0000 mg | ORAL_CAPSULE | Freq: Every day | ORAL | Status: DC
  Administered 2016-02-08 – 2016-02-09 (×2): 240 mg via ORAL
  Filled 2016-02-08 (×2): qty 1

## 2016-02-08 MED ORDER — CEFTRIAXONE SODIUM 1 G IJ SOLR
1.0000 g | INTRAMUSCULAR | Status: DC
  Administered 2016-02-08: 1 g via INTRAVENOUS
  Filled 2016-02-08 (×2): qty 10

## 2016-02-08 MED ORDER — INSULIN ASPART 100 UNIT/ML ~~LOC~~ SOLN
0.0000 [IU] | Freq: Every day | SUBCUTANEOUS | Status: DC
Start: 2016-02-08 — End: 2016-02-09
  Administered 2016-02-08: 2 [IU] via SUBCUTANEOUS

## 2016-02-08 MED ORDER — INSULIN ASPART 100 UNIT/ML ~~LOC~~ SOLN
0.0000 [IU] | Freq: Three times a day (TID) | SUBCUTANEOUS | Status: DC
  Administered 2016-02-08: 2 [IU] via SUBCUTANEOUS
  Administered 2016-02-09: 1 [IU] via SUBCUTANEOUS

## 2016-02-08 NOTE — Care Management Important Message (Signed)
Important Message  Patient Details  Name: [name] [name] MRN: [id number] Date of Birth: 08/25/1936   Medicare Important Message Given:  Yes    Bethena Roys, RN 02/08/2016, 12:24 PM

## 2016-02-08 NOTE — Progress Notes (Signed)
Chest CT results noted. Discussed with patient and son. Have asked pulmonary to evaluate. Called GI (Dr Watt Climes) to evaluate for persistent nausea, 15 pound weight loss in 3 months.   Have also asked [facility] to help with management for non cardiac issues  Chanetta Marshall, NP 02/08/2016 3:54 PM

## 2016-02-08 NOTE — Consult Note (Addendum)
PULMONARY / CRITICAL CARE MEDICINE   Name: [name] [name] MRN: [id number] DOB: Feb 26, 1936    ADMISSION DATE:  02/06/2016 CONSULTATION DATE:  02/08/2016  REFERRING MD:  Dr. Taylor/Cardiology  CHIEF COMPLAINT:  Pt was transferred to [facility] for PPM after syncope; PCCM consulted for lung mass.   HISTORY OF PRESENT ILLNESS:   Pt has a 50PY smoking history, still smokes 1PPD. Not known to have copd or asthma or CA or DVT.  Pt had a chest ct scan in 12/2013 during an ED visit which showed a 1.7 x 1.4 cm nodule/mass in LLL medial basal segment. During this admission, pt had a rpt chest ct scan which showed the nodule/mass now measuring 4.4 x 3 x 4.3 cm with partial obscuring of airway with a 1 cm R precarinal LN.   Pt was transferred from [place] 2/2 sycope fall on 5/18 and had PPM inserted.  Pt with chronic exertional SOB, worse the last 6 mos. Gets SOB with ADLs.  Also with chronic HA and nausea.  Has not been eating over 1 month and has lost 5-10 lbs the last month.   PCCM was consulted for lung mass. Still nauseated, has anorexia, has SOB.   PAST MEDICAL HISTORY :  She  has a past medical history of Hypertensive heart disease; Hyperlipidemia; COPD (chronic obstructive pulmonary disease) ([place]); GERD (gastroesophageal reflux disease); Anxiety; Rheumatoid arthritis ([place]); Osteoarthritis; Hyperuricemia; Chronic combined systolic and diastolic CHF, NYHA class 1 ([place]); CKD II - III; Thyroid nodule; Tobacco abuse; S/P cardiac cath; SVT (supraventricular tachycardia) ([place]); Pulmonary nodule, left; Collagen vascular disease ([place]); Hypertension; and Presence of permanent cardiac pacemaker.  PAST SURGICAL HISTORY: She  has past surgical history that includes bowel obstruction; Tubal ligation; Cardiac catheterization (2008); and Cardiac catheterization (N/A, 02/07/2016).  Allergies  Allergen Reactions  . Benadryl [Diphenhydramine Hcl] Hives  . Lisinopril Cough  . Percocet [Oxycodone-Acetaminophen]  Other (See Comments)    Reaction:  Dizziness   . Sulfa Antibiotics Hives    No current facility-administered medications on file prior to encounter.   Current Outpatient Prescriptions on File Prior to Encounter  Medication Sig  . amiodarone (PACERONE) 200 MG tablet Take 200 mg by mouth 2 (two) times daily.  Marland Kitchen aspirin 81 MG tablet Take 81 mg by mouth daily.  Marland Kitchen diltiazem (CARDIZEM CD) 120 MG 24 hr capsule Take 1 capsule (120 mg total) by mouth daily.  Marland Kitchen LORazepam (ATIVAN) 0.5 MG tablet Take 0.5 mg by mouth every 8 (eight) hours as needed for anxiety.  . mirtazapine (REMERON) 15 MG tablet Take 1 tablet (15 mg total) by mouth at bedtime.  . pantoprazole (PROTONIX) 40 MG tablet Take 40 mg by mouth 2 (two) times daily.  . predniSONE (DELTASONE) 10 MG tablet Take 10 mg by mouth daily.  . traMADol (ULTRAM) 50 MG tablet Take 50 mg by mouth every 6 (six) hours as needed for moderate pain.   Marland Kitchen albuterol (PROVENTIL HFA;VENTOLIN HFA) 108 (90 Base) MCG/ACT inhaler Inhale 2 puffs into the lungs every 6 (six) hours as needed for wheezing or shortness of breath.  . furosemide (LASIX) 20 MG tablet Take 1 tablet (20 mg total) by mouth 2 (two) times daily as needed for edema.  . ondansetron (ZOFRAN) 4 MG tablet Take 4 mg by mouth every 8 (eight) hours as needed for nausea or vomiting.    FAMILY HISTORY:  Her indicated that her mother is deceased. She indicated that her father is deceased.   Both parents are deceased and  they both had lung CA  SOCIAL HISTORY: She  reports that she has been smoking Cigarettes.  She has a 25 pack-year smoking history. She has never used smokeless tobacco. She reports that she does not drink alcohol or use illicit drugs.  Widow, with 3 children. Daughter stays with her. Worked in a [employer]. Denies ETOH.   REVIEW OF SYSTEMS:   14 point ROS done and everything else is (-) other than : anorexia, nausea, vomiting, weight loss, sob with exertion. On and off cough. (-) cp, abd pain.  Chronic joint pains and edema.   SUBJECTIVE:  Comfortable laying albeit had some nausea which is chronic. (-) cp. Some SOB.   VITAL SIGNS: BP 133/86 mmHg  Pulse 107  Temp(Src) 99.7 F (37.6 C) (Oral)  Resp 13  Ht '5\' 6"'$  (1.676 m)  Wt 82.963 kg (182 lb 14.4 oz)  BMI 29.53 kg/m2  SpO2 97%   INTAKE / OUTPUT: I/O last 3 completed shifts: In: 783.8 [I.V.:733.8; IV Piggyback:50] Out: 275 [Urine:275]  PHYSICAL EXAMINATION: General:  Awake, comfortable, not in distress, obese Neuro:  CN grossly intact. (-) lateralizing signs.  HEENT:  PERLAA. (-) NVD. Crowded airway.  Cardiovascular:  Good s1/s2. (-) s3/m/r/g  Lungs:  Fair ae, dec BS on bibasilar areas with crackles at bases Abdomen:  (+) BS, soft, obese, NT. (-) masses.  Musculoskeletal:  Intact muscles. Normal tone and bulk. Skin:  Trace edema. (-) rash/cyanosis/clubbing  LABS:  BMET  Recent Labs Lab 02/06/16 2050 02/07/16 0256 02/07/16 0801  NA 135 137 140  K 5.7* 4.6 4.6  CL 102 99* 101  CO2 21* 24 25  BUN '20 20 20  '$ CREATININE 1.92* 1.80* 1.79*  GLUCOSE 340* 127* 99    Electrolytes  Recent Labs Lab 02/06/16 2050 02/07/16 0256 02/07/16 0801  CALCIUM 8.9 8.5* 8.5*  MG  --  2.7*  --     CBC  Recent Labs Lab 02/06/16 2050 02/07/16 0256 02/07/16 0801  WBC 15.7* 17.4* 15.8*  HGB 11.0* 10.4* 10.1*  HCT 35.3 33.6* 32.8*  PLT 294 233 249    Coag's  Recent Labs Lab 02/07/16 0256  APTT 30  INR 1.11    Sepsis Markers No results for input(s): LATICACIDVEN, PROCALCITON, O2SATVEN in the last 168 hours.  ABG No results for input(s): PHART, PCO2ART, PO2ART in the last 168 hours.  Liver Enzymes  Recent Labs Lab 02/07/16 0256  AST 22  ALT 21  ALKPHOS 71  BILITOT 0.3  ALBUMIN 2.4*    Cardiac Enzymes  Recent Labs Lab 02/07/16 0256 02/07/16 0801 02/07/16 1443  TROPONINI 0.06* 0.07* 0.06*    Glucose  Recent Labs Lab 02/07/16 1143 02/07/16 1424 02/07/16 1630 02/07/16 2157  02/08/16 0741 02/08/16 1141  GLUCAP 82 79 119* 140* 124* 123*    Imaging Dg Chest 2 View  02/08/2016  CLINICAL DATA:  80 year old female status post pacemaker insertion. EXAM: CHEST  2 VIEW COMPARISON:  Preoperative chest x-ray 02/06/2016 ; chest CT 01/10/2014 FINDINGS: Interval plasma left subclavian approach cardiac rhythm maintenance device with leads projecting over the right atrium and right ventricle. No evidence of pneumothorax, new pleural effusion or other complicating feature. Stable mild cardiomegaly. Atherosclerotic and tortuous thoracic aorta. Bibasilar opacities favored to reflect atelectasis. There is a nodular opacity in the left lower lobe. On the prior chest x-ray this was favored to represent atelectasis. However, on the prior chest CT from 01/10/2014 a small pulmonary nodule was noted in this location. IMPRESSION: 1. Status post  insertion of a left subclavian approach cardiac rhythm maintenance device without evidence of immediate complication. 2. Left lower lobe nodular opacity which appears to have enlarged comparing across modalities to the prior CT scan of the chest dated 01/10/2014. This is concerning for an enlarging pulmonary nodule and further evaluation with repeat CT scan of the chest is recommended. These results will be called to the ordering clinician or representative by the Radiologist Assistant, and communication documented in the PACS or zVision Dashboard. Electronically Signed   By: Jacqulynn Cadet M.D.   On: 02/08/2016 07:45   Ct Chest Wo Contrast  02/08/2016  CLINICAL DATA:  Patient had a masslike opacity seen all recent chest radiography. Patient was admitted for pacemaker insertion. Patient denies chest pain. EXAM: CT CHEST WITHOUT CONTRAST TECHNIQUE: Multidetector CT imaging of the chest was performed following the standard protocol without IV contrast. COMPARISON:  Prior chest radiographs.  Prior chest CT, 01/10/2014. FINDINGS: Neck base and axilla:  No mass  or adenopathy. Mediastinum and hila: Heart normal in size and configuration. New pacemaker leads project in the right atrium and right ventricle. There are mild coronary artery calcifications. No mediastinal or hilar masses. There are several mildly prominent lymph nodes the largest measuring 1 cm short axis, a right precarinal node. Lungs and pleural: In the anteromedial basal segment of the left lower lobe, with there is a mass with irregular margins and bulges the oblique fissure anteriorly. The mass measures 4.4 x 3.0 x 4.3 cm. There is some additional opacity along the inferior margin of this that is likely atelectasis. The anteromedial basal segment bronchus is occluded as it enters the mass. There are no other masses. There are no suspicious nodules. Subsegmental atelectasis is noted at the lung bases. There is mild scarring in the apices. There are mild changes of centrilobular emphysema most evident in the upper lobes. No pleural effusion. No pneumothorax. No evidence of pneumonia or edema. Limited upper abdomen: Partly imaged low-density left kidney upper pole lesion consistent with a cyst. No adrenal masses. No liver lesions on the included field of view. Musculoskeletal:  No osteoblastic or osteolytic lesions. IMPRESSION: 1. 4.4 cm left lower lobe mass suspicious for bronchogenic carcinoma. Tissue sampling is recommended. 2. No acute findings. Electronically Signed   By: Lajean Manes M.D.   On: 02/08/2016 15:08     STUDIES:  CT scan chest (5/19) as mentioned in HPI.  CT scan head (5/18)  No acute abn; atrophy  CULTURES: MRSA 5/17 (-) Blood culture 5/18 >  Urine culture 5/19 >  ANTIBIOTICS: Rocephin 5/19 >   SIGNIFICANT EVENTS: 5/17 admit from [place] 2/2 syncope/fall. Pt had PPM insertion for CHB which require transcutaneous pacing 5/19 PCCM consulted for lung mass.     DISCUSSION: Pt has a 50PY smoking history, still smokes 1PPD. Not known to have copd or asthma or CA or DVT.   Pt had a chest ct scan in 12/2013 during an ED visit which showed a 1.7 x 1.4 cm nodule/mass in LLL medial basal segment. During this admission, pt had a rpt chest ct scan which showed the nodule/mass now measuring 4.4 x 3 x 4.3 cm with partial obscuring of airway with a 1 cm R precarinal LN.  PCCM consulted for lung mass.  Pt was admitted after syncope/fall and was found to have CHB. Had PPM. Also being treated for UTI.   ASSESSMENT / PLAN:  A: Lung Mass, LLL, bigger now compared to 12/2013 Chest ct scan, likely malignant.  Likely with moderate COPD Likely with OSA Continues to smoke cigarettes CHB, S/P PPM UTI Generalized Deconditioning  P:   I extensively d/w pt and son Danijela re: lung mass and the need for Bx.  We need to do bronchoscopy with bx to determine the etiology of mass and decide on Rx after that.  Pt was undecided re: Bx.  She wants to go home.  I think she has known about this mass awhile and she likely refused Bx then. Family initially wanted Bx during this admission but son said to hold off on Bronchoscopy and bx until pt agrees. They understand that mass could be (and most likely) is CA.   We will hold off on Bronchoscopy and bx until pt decides on it.  Needs f/u with Pulmonary 1-2 weeks after discharge so we can arrange for bronchoscopy and Bx. I gave son office number. Son instructed to call for f/u as well.   In the meantime, call us back if pt decides for bx during this admission. PCCM will sign off for now. Likely will need PET Scan, PFTs if she wants to do Bronch and bx.  Counseled on smoking cessation.  Plan d/w son Adi as well and he agrees.   Suggest starting pt on Symbicort 160/4.5 2P BID for COPD, alb MDI 2P q4 prn  J. Shirl Harris, MD 02/08/2016, 5:31 PM  Pulmonary and Critical Care Pager [phone] After 3 pm or if no answer, call [phone]

## 2016-02-08 NOTE — Consult Note (Signed)
Consult Note                                                      [name] [name]  LFY:[id number]  DOB: 03-10-1936  DOA: 02/06/2016  PCP: Juanell Fairly, MD   Requesting physician: Dr. Lovena Le  Reason for consultation: Persistent nausea   History of Present llness   [name] [name] is an 80 y.o. female who was initially transferred from [facility] where she was found to have complete heart block needing admission by EP and pacemaker placement. His ago, has history of CAD, chronic diastolic CHF with EF reported to be normal exact number not mentioned, DM type II, ongoing smoking counseled to quit, GERD, rheumatoid arthritis affecting mostly knees chronically on prednisone, anxiety, SVT.  Patient gives a history of nausea for the last several months along with an intentional 10 pound weight loss due to decreased oral intake, she was admitted 2 days ago by cardiology for pacemaker placement as dictated above, she also has history of questionable left upper lobe chest x-ray nodule for which she underwent CT chest this admission, she was found to have a left upper lobe mass suspicious for bronchogenic carcinoma, due to her persistent nausea, weight loss and new finding of possible lung cancer internal medicine was consulted.  In my interview patient's only complaint is ongoing chills, nausea and unintentional weight loss, she denies any headache, no chest pain or palpitations, no abdominal pain, denies any dysuria, no constipation or diarrhea, no blood in stool or urine. No focal weakness.  During the review of her labs it looks like patient has a severe UTI, currently is mounting a temperature, is mildly tachycardic and possibly an early sepsis. Her abdominal exam is  benign, she is also noted to have developed acute renal failure.   Review Of Systems     In addition to the HPI above,   No Fevers, But is experiencing some chills No Headache, No changes with Vision or hearing, No problems swallowing food or Liquids, No Chest pain, Cough or Shortness of Breath, No Abdominal pain, positive nausea with intermittent episodes of vomiting for the last several months, Bowel movements are regular, No Blood in stool or Urine, No dysuria, No new skin rashes or bruises, No new joints pains-aches,  No new weakness, tingling, numbness in any extremity, No recent weight gain, about 10 pound unintentional weight loss in the last 2 months No polyuria, polydypsia or polyphagia, No significant Mental Stressors.  A full 10 point Review of Systems was done, except as stated  above, all other Review of Systems were negative.    Social History   Social History  Substance Use Topics  . Smoking status: Current Every Day Smoker -- 0.50 packs/day for 50 years    Types: Cigarettes  . Smokeless tobacco: Never Used  . Alcohol Use: No      Family History   Family History  Problem Relation Age of Onset  . Coronary artery disease Neg Hx     Early  . Stroke Neg Hx     CVA  . Hypertension Other   . Diabetes Other   . Hypertension Mother       Medications   Prior to Admission medications   Medication Sig Start Date End Date Taking? Authorizing Provider  amiodarone (PACERONE) 200 MG tablet Take 200 mg by mouth 2 (two) times daily.   Yes Historical Provider, MD  aspirin 81 MG tablet Take 81 mg by mouth daily.   Yes Historical Provider, MD  diltiazem (CARDIZEM CD) 120 MG 24 hr capsule Take 1 capsule (120 mg total) by mouth daily. 08/22/15  Yes Alisa Graff, FNP  LORazepam (ATIVAN) 0.5 MG tablet Take 0.5 mg by mouth every 8 (eight) hours as needed for anxiety.   Yes Historical Provider, MD  mirtazapine (REMERON) 15 MG tablet Take 1 tablet (15 mg total) by mouth  at bedtime. 11/14/15  Yes Minna Merritts, MD  pantoprazole (PROTONIX) 40 MG tablet Take 40 mg by mouth 2 (two) times daily.   Yes Historical Provider, MD  predniSONE (DELTASONE) 10 MG tablet Take 10 mg by mouth daily.   Yes Historical Provider, MD  traMADol (ULTRAM) 50 MG tablet Take 50 mg by mouth every 6 (six) hours as needed for moderate pain.    Yes Historical Provider, MD  albuterol (PROVENTIL HFA;VENTOLIN HFA) 108 (90 Base) MCG/ACT inhaler Inhale 2 puffs into the lungs every 6 (six) hours as needed for wheezing or shortness of breath. 11/14/15   Minna Merritts, MD  furosemide (LASIX) 20 MG tablet Take 1 tablet (20 mg total) by mouth 2 (two) times daily as needed for edema. 02/15/15   Minna Merritts, MD  ondansetron (ZOFRAN) 4 MG tablet Take 4 mg by mouth every 8 (eight) hours as needed for nausea or vomiting.    Historical Provider, MD    Anti-infectives    Start     Dose/Rate Route Frequency Ordered Stop   02/08/16 1730  cefTRIAXone (ROCEPHIN) 1 g in dextrose 5 % 50 mL IVPB     1 g 100 mL/hr over 30 Minutes Intravenous Every 24 hours 02/08/16 1623 02/11/16 1729   02/07/16 1800  ceFAZolin (ANCEF) IVPB 1 g/50 mL premix     1 g 100 mL/hr over 30 Minutes Intravenous Every 12 hours 02/07/16 1420 02/08/16 0659   02/07/16 1230  gentamicin (GARAMYCIN) 80 mg in sodium chloride irrigation 0.9 % 500 mL irrigation     80 mg Irrigation To Cath Lab 02/07/16 1211 02/07/16 1328   02/07/16 1230  ceFAZolin (ANCEF) IVPB 2g/100 mL premix     2 g 200 mL/hr over 30 Minutes Intravenous To Cath Lab 02/07/16 1211 02/07/16 1315      Scheduled Meds: . alum & mag hydroxide-simeth  30 mL Oral TID  . amiodarone  200 mg Oral Daily  . antiseptic oral rinse  7 mL Mouth Rinse BID  . aspirin EC  81 mg Oral Daily  . atorvastatin  80 mg Oral q1800  . cefTRIAXone (ROCEPHIN)  IV  1 g Intravenous Q24H  . diltiazem  240 mg Oral Daily  . heparin  5,000 Units Subcutaneous Q8H  . pantoprazole  40 mg Oral BID  .  sodium chloride  500 mL Intravenous Once   Continuous Infusions: . sodium chloride     PRN Meds:.albuterol, ALPRAZolam, HYDROcodone-acetaminophen, nitroGLYCERIN, ondansetron (ZOFRAN) IV  Allergies  Allergen Reactions  . Benadryl [Diphenhydramine Hcl] Hives  . Lisinopril Cough  . Percocet [Oxycodone-Acetaminophen] Other (See Comments)    Reaction:  Dizziness   . Sulfa Antibiotics Hives    Objective   Physical Exam  Vitals  Blood pressure 133/86, pulse 107, temperature 99.7 F (37.6 C), temperature source Oral, resp. rate 13, height '5\' 6"'$  (1.676 m), weight 82.963 kg (182 lb 14.4 oz), SpO2 97 %.   1. General Elderly obese African-American female lying in bed in NAD,    2. Normal affect and insight, Not Suicidal or Homicidal, Awake Alert, Oriented X 3.  3. No F.N deficits, ALL C.Nerves Intact, Strength 5/5 all 4 extremities, Sensation intact all 4 extremities, Plantars down going.  4. Ears and Eyes appear Normal, Conjunctivae clear, PERRLA. Moist Oral Mucosa.  5. Supple Neck, No JVD, No cervical lymphadenopathy appriciated, No Carotid Bruits.  6. Symmetrical Chest wall movement, Good air movement bilaterally, CTAB.Left chest wall pacemaker site appears clean and under bandage  7. RRR, No Gallops, Rubs or Murmurs, No Parasternal Heave.  8. Positive Bowel Sounds, Abdomen Soft, No tenderness, No organomegaly appriciated,No rebound -guarding or rigidity.  9.  No Cyanosis, Normal Skin Turgor, No Skin Rash or Bruise.  10. Good muscle tone,  joints appear normal , no effusions, Normal ROM.  11. No Palpable Lymph Nodes in Neck or Axillae     Data   CBC  Recent Labs Lab 02/06/16 2050 02/07/16 0256 02/07/16 0801  WBC 15.7* 17.4* 15.8*  HGB 11.0* 10.4* 10.1*  HCT 35.3 33.6* 32.8*  PLT 294 233 249  MCV 86.7 88.7 89.1  MCH 27.2 27.4 27.4  MCHC 31.3* 31.0 30.8  RDW 17.9* 16.6* 16.7*  LYMPHSABS 2.3 1.3  --   MONOABS 0.7 1.0  --   EOSABS 0.0 0.0  --   BASOSABS 0.0  0.0  --    ------------------------------------------------------------------------------------------------------------------  Chemistries   Recent Labs Lab 02/06/16 2050 02/07/16 0256 02/07/16 0801  NA 135 137 140  K 5.7* 4.6 4.6  CL 102 99* 101  CO2 21* 24 25  GLUCOSE 340* 127* 99  BUN '20 20 20  '$ CREATININE 1.92* 1.80* 1.79*  CALCIUM 8.9 8.5* 8.5*  MG  --  2.7*  --   AST  --  22  --   ALT  --  21  --   ALKPHOS  --  71  --   BILITOT  --  0.3  --    ------------------------------------------------------------------------------------------------------------------ estimated creatinine clearance is 28.6 mL/min (by C-G formula based on Cr of 1.79). ------------------------------------------------------------------------------------------------------------------  Recent Labs  02/07/16 0256  TSH 0.773     Coagulation profile  Recent Labs Lab 02/07/16 0256  INR 1.11   ------------------------------------------------------------------------------------------------------------------- No results for input(s): DDIMER in the last 72 hours. -------------------------------------------------------------------------------------------------------------------  Cardiac Enzymes  Recent Labs Lab 02/07/16 0256 02/07/16 0801 02/07/16 1443  TROPONINI 0.06* 0.07* 0.06*   ------------------------------------------------------------------------------------------------------------------ Invalid input(s): POCBNP   ---------------------------------------------------------------------------------------------------------------  Urinalysis    Component Value Date/Time   COLORURINE YELLOW 02/07/2016 0707   COLORURINE Yellow 12/28/2014 1143   APPEARANCEUR CLOUDY* 02/07/2016 0707   APPEARANCEUR Hazy 12/28/2014 1143  LABSPEC 1.021 02/07/2016 0707   LABSPEC 1.013 12/28/2014 1143   PHURINE 6.5 02/07/2016 0707   PHURINE 6.0 12/28/2014 1143   GLUCOSEU 500* 02/07/2016 0707   GLUCOSEU  Negative 12/28/2014 1143   HGBUR SMALL* 02/07/2016 0707   HGBUR Negative 12/28/2014 1143   BILIRUBINUR NEGATIVE 02/07/2016 0707   BILIRUBINUR Negative 12/28/2014 1143   KETONESUR NEGATIVE 02/07/2016 0707   KETONESUR Negative 12/28/2014 1143   PROTEINUR 30* 02/07/2016 0707   PROTEINUR 30 mg/dL 12/28/2014 1143   NITRITE NEGATIVE 02/07/2016 0707   NITRITE Negative 12/28/2014 1143   LEUKOCYTESUR MODERATE* 02/07/2016 0707   LEUKOCYTESUR Trace 12/28/2014 1143   Lab Results  Component Value Date   HGBA1C 8.4* 02/07/2016   CBG (last 3)   Recent Labs  02/07/16 2157 02/08/16 0741 02/08/16 1141  GLUCAP 140* 124* 123*      Imaging    Dg Chest 2 View  02/08/2016  CLINICAL DATA:  80 year old female status post pacemaker insertion. EXAM: CHEST  2 VIEW COMPARISON:  Preoperative chest x-ray 02/06/2016 ; chest CT 01/10/2014 FINDINGS: Interval plasma left subclavian approach cardiac rhythm maintenance device with leads projecting over the right atrium and right ventricle. No evidence of pneumothorax, new pleural effusion or other complicating feature. Stable mild cardiomegaly. Atherosclerotic and tortuous thoracic aorta. Bibasilar opacities favored to reflect atelectasis. There is a nodular opacity in the left lower lobe. On the prior chest x-ray this was favored to represent atelectasis. However, on the prior chest CT from 01/10/2014 a small pulmonary nodule was noted in this location. IMPRESSION: 1. Status post insertion of a left subclavian approach cardiac rhythm maintenance device without evidence of immediate complication. 2. Left lower lobe nodular opacity which appears to have enlarged comparing across modalities to the prior CT scan of the chest dated 01/10/2014. This is concerning for an enlarging pulmonary nodule and further evaluation with repeat CT scan of the chest is recommended. These results will be called to the ordering clinician or representative by the Radiologist Assistant, and  communication documented in the PACS or zVision Dashboard. Electronically Signed   By: Jacqulynn Cadet M.D.   On: 02/08/2016 07:45   Ct Head Wo Contrast  02/07/2016  CLINICAL DATA:  Initial evaluation for acute lightheadedness, loss of consciousness. EXAM: CT HEAD WITHOUT CONTRAST TECHNIQUE: Contiguous axial images were obtained from the base of the skull through the vertex without intravenous contrast. COMPARISON:  Prior CT from 01/14/2016. FINDINGS: Generalized cerebral atrophy with mild chronic small vessel ischemic disease present, stable from prior. Vascular calcifications present within the carotid siphons. No acute intracranial hemorrhage. No acute large vessel territory infarct. No mass lesion, midline shift, or mass effect. Mild ventricular prominence related global parenchymal volume loss without hydrocephalus. No extra-axial fluid collection. Scalp soft tissues within normal limits. No acute abnormality about the globes or orbits. Patient is status post bilateral cataract extraction. Paranasal sinuses are clear.  No mastoid effusion. Calvarium intact. IMPRESSION: 1. No acute intracranial process. 2. Generalized cerebral atrophy with mild chronic small vessel ischemic disease, stable. Electronically Signed   By: Jeannine Boga M.D.   On: 02/07/2016 03:43   Ct Chest Wo Contrast  02/08/2016  CLINICAL DATA:  Patient had a masslike opacity seen all recent chest radiography. Patient was admitted for pacemaker insertion. Patient denies chest pain. EXAM: CT CHEST WITHOUT CONTRAST TECHNIQUE: Multidetector CT imaging of the chest was performed following the standard protocol without IV contrast. COMPARISON:  Prior chest radiographs.  Prior chest CT, 01/10/2014. FINDINGS: Neck base and  axilla:  No mass or adenopathy. Mediastinum and hila: Heart normal in size and configuration. New pacemaker leads project in the right atrium and right ventricle. There are mild coronary artery calcifications. No  mediastinal or hilar masses. There are several mildly prominent lymph nodes the largest measuring 1 cm short axis, a right precarinal node. Lungs and pleural: In the anteromedial basal segment of the left lower lobe, with there is a mass with irregular margins and bulges the oblique fissure anteriorly. The mass measures 4.4 x 3.0 x 4.3 cm. There is some additional opacity along the inferior margin of this that is likely atelectasis. The anteromedial basal segment bronchus is occluded as it enters the mass. There are no other masses. There are no suspicious nodules. Subsegmental atelectasis is noted at the lung bases. There is mild scarring in the apices. There are mild changes of centrilobular emphysema most evident in the upper lobes. No pleural effusion. No pneumothorax. No evidence of pneumonia or edema. Limited upper abdomen: Partly imaged low-density left kidney upper pole lesion consistent with a cyst. No adrenal masses. No liver lesions on the included field of view. Musculoskeletal:  No osteoblastic or osteolytic lesions. IMPRESSION: 1. 4.4 cm left lower lobe mass suspicious for bronchogenic carcinoma. Tissue sampling is recommended. 2. No acute findings. Electronically Signed   By: Lajean Manes M.D.   On: 02/08/2016 15:08   Dg Chest Portable 1 View  02/06/2016  CLINICAL DATA:  Altered mental status. Syncope and vomiting at dinner tonight, PT is s/p code and CPR, verbal at this time, but confused EXAM: PORTABLE CHEST 1 VIEW COMPARISON:  01/14/2016 FINDINGS: Normal heart size and pulmonary vascularity. Shallow inspiration with linear atelectasis in the lung bases. No definite consolidation. The area of previous consolidation in the left lower lobe is today obscured by overlying EKG patches and I cannot tell whether it remains or has cleared. No blunting of costophrenic angles. No pneumothorax. Tortuous aorta. IMPRESSION: Shallow inspiration with linear atelectasis in the lung bases. Correlation with prior  study is limited due to overlying EKG patches. Electronically Signed   By: Lucienne Capers M.D.   On: 02/06/2016 21:58      Assessment & Paln    1. UTI with possible early sepsis (chills, tachycardia and low-grade fever) . Blood cultures are pending, urine cultures ordered, placed on Rocephin, normal saline bolus along with maintenance, check lactic acid and if high trend. Currently hemodynamically stable except for mild tachycardia. We will monitor closely.  2. ARF. Baseline creatinine under 1, do not see any IV dye exposure, not on any nephrotoxins, due to her UTI will check renal ultrasound to rule out obstruction. Gentle hydration for now and trend BMP.  3. SVT, complete heart block. Status post pacemaker placement. Currently on amiodarone along with Cardizem. We'll defer further management to cardiology which is the primary team.  4. Persistent nausea for several months. She has never had any GI evaluation including no colonoscopy, family history positive for colon cancer, for now we'll place her on PPI along with 3 doses of scheduled Maalox to rule out reflux as the cause, treat UTI, check KUB to rule out obstruction. If nausea persists GI evaluation. Could be done outpatient.  5. Possible left upper lobe mass/cancer in the setting of 25-pack-year smoking - cardiology has consulted pulmonary we'll defer management to them.  6. Ongoing smoking. Counseled to quit.  7. Rheumatoid arthritis. Patient chronically on prednisone. Currently steroids were not noted on her medication list, for now  will place her on low-dose IV hydrocortisone due to #1 above and monitor.  If she becomes hemodynamically unstable, will change IV hydrocortisone 100 mg every 8 hours and transfer her to step down.  8. Anxiety. On Xanax continue.  9. Dyslipidemia. Continue statin  10. DM type II. For now sliding scale has been added, A1c is above 8, with steroids sugars will likely run high will add Lantus as  needed.    DVT Prophylaxis Heparin    AM Labs Ordered, also please review Full Orders  Family Communication: Plan discussed with patient and son   Thank you for the consult, we will follow the patient with you in the Hospital.   Lala Lund K M.D on 02/08/2016 at 4:46 PM  Between 7am to 7pm - Pager - [phone]. After 7pm go to [email or web address] - password [email or web address]  [facility]  - Office  [phone]   Thank you for the consult, we will follow the patient with you in the Hospital.

## 2016-02-08 NOTE — Evaluation (Signed)
Physical Therapy Evaluation Patient Details Name: LIZANNE ERKER MRN: [id number] DOB: May 17, 1936 Today's Date: 02/08/2016   History of Present Illness  80 y.o. female PMH of ongoing tobacco abuse, NSTEMI April 2015, normal coronary arteries in 2008, combined chronic systolic and diastolic heart failure with EF recovery, hypertension, COPD, dyslipidemia, rheumatoid arthritis, T2DM, GERD who has had multiple ER visits, loss of appetite, weight loss, abnormal chest x-ray findings fairly recent SVT managed with medications (she has not wanted ablation) who has had worse balance issues and development of nausea/vomiting frothy pink material and loss of consciousness today. She was found to have complete heart block. s/p pacemaker placement 02/07/16.  Clinical Impression  Pt admitted with above diagnosis. Pt currently with functional limitations due to the deficits listed below (see PT Problem List). Pt ambulated 70' with min hand held assist, HR 115, SaO2 98% on RA. Mod assist for supine to sit. Pt will have prn assist from her daughter at home.  Pt will benefit from skilled PT to increase their independence and safety with mobility to allow discharge to the venue listed below.       Follow Up Recommendations Home health PT    Equipment Recommendations  Hospital bed;Cane;3in1 (PT) (pt requesting hospital bed, stated she has difficulty getting OOB, and a new cane as hers is "torn up at the bottom")    Recommendations for Other Services       Precautions / Restrictions Precautions Precautions: Fall Precaution Comments: family reports 2 falls this year Restrictions Weight Bearing Restrictions: No      Mobility  Bed Mobility Overal bed mobility: Needs Assistance Bed Mobility: Supine to Sit     Supine to sit: Min assist;HOB elevated     General bed mobility comments: Min A to raise trunk  Transfers Overall transfer level: Needs assistance Equipment used: 1 person hand held  assist Transfers: Sit to/from Stand Sit to Stand: Min guard            Ambulation/Gait Ambulation/Gait assistance: Min assist Ambulation Distance (Feet): 70 Feet Assistive device: 1 person hand held assist Gait Pattern/deviations: Step-through pattern;Decreased stride length   Gait velocity interpretation: at or above normal speed for age/gender General Gait Details: HR 115 with walking, SaO2 98% on RA, steady with HHA of 1 (pt uses cane at baseline)  Science writer    Modified Rankin (Stroke Patients Only)       Balance Overall balance assessment: History of Falls;Needs assistance   Sitting balance-Leahy Scale: Good       Standing balance-Leahy Scale: Fair Standing balance comment: min HHA with walking                             Pertinent Vitals/Pain Pain Assessment: No/denies pain    Home Living Family/patient expects to be discharged to:: Private residence Living Arrangements: Children Available Help at Discharge: Available PRN/intermittently;Family Type of Home: House Home Access: Stairs to enter   [employer] of Steps: 2 Home Layout: One level Home Equipment: Grab bars - tub/shower;Shower seat;Walker - 4 wheels;Cane - single point;Other (comment)      Prior Function Level of Independence: Independent with assistive device(s)         Comments: Patient has most recently been ambulating with a SPC. 2 falls in past year (one out of bed, one outside in yard)     Hand Dominance  Extremity/Trunk Assessment   Upper Extremity Assessment: Defer to OT evaluation (LUE in sling)           Lower Extremity Assessment: Overall WFL for tasks assessed      Cervical / Trunk Assessment: Normal  Communication   Communication: No difficulties  Cognition Arousal/Alertness: Awake/alert Behavior During Therapy: WFL for tasks assessed/performed Overall Cognitive Status: Within Functional Limits  for tasks assessed                      General Comments      Exercises        Assessment/Plan    PT Assessment Patient needs continued PT services  PT Diagnosis Difficulty walking;Generalized weakness   PT Problem List Decreased activity tolerance;Decreased balance;Decreased mobility;Obesity  PT Treatment Interventions Gait training;Functional mobility training;Therapeutic activities;Patient/family education;Therapeutic exercise;Balance training   PT Goals (Current goals can be found in the Care Plan section) Acute Rehab PT Goals Patient Stated Goal: return home PT Goal Formulation: With patient/family Time For Goal Achievement: 02/15/16 Potential to Achieve Goals: Good    Frequency Min 3X/week   Barriers to discharge        Co-evaluation               End of Session Equipment Utilized During Treatment: Gait belt Activity Tolerance: Patient tolerated treatment well Patient left: in chair;with call bell/phone within reach;with family/visitor present Nurse Communication: Mobility status         Time: 4163-8453 PT Time Calculation (min) (ACUTE ONLY): 24 min   Charges:   PT Evaluation $PT Eval Low Complexity: 1 Procedure PT Treatments $Gait Training: 8-22 mins   PT G Codes:        Philomena Doheny 02/08/2016, 2:04 PM [phone]

## 2016-02-08 NOTE — Consult Note (Signed)
[facility] CONSULT Reason for consult: possible colonoscopy Referring Physician: Dr. Gilman Schmidt [name] [name] is an 80 y.o. female.  HPI: she has a known history of cardiovascular disease with previous MIs and a history of SVT. She has been weak and dizzy and was found to have complete heart block [facility] and required transcutaneous pacing and was transferred to [facility]. She apparently had a syncopal spell. Her other symptoms are severe COPD, severe heart failure, continued ongoing tobacco abuse and severe COPD. She had a pacemaker inserted by Dr. Lovena Le. She has had persistent nausea and vomiting and has been placed on PPI therapy. She does have a family history of colon cancer and has never had a colonoscopy. She tells me that colonoscopies been discussed by her doctors in the past but she was told that her lungs were too bad and that she would never wake up if she was sedated. Therefore colonoscopy was not performed. She denies any rectal bleeding. She was found to have a mass in the left lower lobe that was larger than seen 4/15 and was very likely malignant. Lung biopsy was discussed but the patient at this point in time is declining.  Past Medical History  Diagnosis Date  . Hypertensive heart disease   . Hyperlipidemia   . COPD (chronic obstructive pulmonary disease) ([place])     a. With ongoing tobacco use.  Marland Kitchen GERD (gastroesophageal reflux disease)   . Anxiety   . Rheumatoid arthritis ([place])   . Osteoarthritis   . Hyperuricemia   . Chronic combined systolic and diastolic CHF, NYHA class 1 ([place])     a. 12/2013 Echo: EF 30-35%, DD, mid-distal anterior wall, apical and mid-apical inferior/posterior WMAs, mild MR/TR, normal RVSP;  b. 07/2015 Echo: EF nl, Gr1 DD, mild MR.  . CKD II - III   . Thyroid nodule   . Tobacco abuse   . S/P cardiac cath     a. 2008 Cath: nl cors.  . SVT (supraventricular tachycardia) ([place])     a. 12/2013 & 01/2014-->broke with adenosine;  b.  07/2015 Amio started.  . Pulmonary nodule, left     a.  H/O 1.7 cm nodule anterior medial base of the left lower lobe, not seen on 12/2013 CXR [facility]).  . Collagen vascular disease ([place])   . Hypertension   . Presence of permanent cardiac pacemaker     Past Surgical History  Procedure Laterality Date  . Bowel obstruction    . Tubal ligation    . Cardiac catheterization  2008    [facility]  . Ep implantable device N/A 02/07/2016    Procedure: Pacemaker Implant;  Surgeon: Evans Lance, MD;  Location: [facility];  Service: Cardiovascular;  Laterality: N/A;    Family History  Problem Relation Age of Onset  . Coronary artery disease Neg Hx     Early  . Stroke Neg Hx     CVA  . Hypertension Other   . Diabetes Other   . Hypertension Mother     Social History:  reports that she has been smoking Cigarettes.  She has a 25 pack-year smoking history. She has never used smokeless tobacco. She reports that she does not drink alcohol or use illicit drugs.  Allergies:  Allergies  Allergen Reactions  . Benadryl [Diphenhydramine Hcl] Hives  . Lisinopril Cough  . Percocet [Oxycodone-Acetaminophen] Other (See Comments)    Reaction:  Dizziness   . Sulfa Antibiotics Hives    Medications; Prior  to Admission medications   Medication Sig Start Date End Date Taking? Authorizing Provider  amiodarone (PACERONE) 200 MG tablet Take 200 mg by mouth 2 (two) times daily.   Yes Historical Provider, MD  aspirin 81 MG tablet Take 81 mg by mouth daily.   Yes Historical Provider, MD  diltiazem (CARDIZEM CD) 120 MG 24 hr capsule Take 1 capsule (120 mg total) by mouth daily. 08/22/15  Yes Alisa Graff, FNP  LORazepam (ATIVAN) 0.5 MG tablet Take 0.5 mg by mouth every 8 (eight) hours as needed for anxiety.   Yes Historical Provider, MD  mirtazapine (REMERON) 15 MG tablet Take 1 tablet (15 mg total) by mouth at bedtime. 11/14/15  Yes Minna Merritts, MD  pantoprazole (PROTONIX) 40 MG tablet Take 40 mg by  mouth 2 (two) times daily.   Yes Historical Provider, MD  predniSONE (DELTASONE) 10 MG tablet Take 10 mg by mouth daily.   Yes Historical Provider, MD  traMADol (ULTRAM) 50 MG tablet Take 50 mg by mouth every 6 (six) hours as needed for moderate pain.    Yes Historical Provider, MD  albuterol (PROVENTIL HFA;VENTOLIN HFA) 108 (90 Base) MCG/ACT inhaler Inhale 2 puffs into the lungs every 6 (six) hours as needed for wheezing or shortness of breath. 11/14/15   Minna Merritts, MD  furosemide (LASIX) 20 MG tablet Take 1 tablet (20 mg total) by mouth 2 (two) times daily as needed for edema. 02/15/15   Minna Merritts, MD  ondansetron (ZOFRAN) 4 MG tablet Take 4 mg by mouth every 8 (eight) hours as needed for nausea or vomiting.    Historical Provider, MD   . alum & mag hydroxide-simeth  30 mL Oral TID  . amiodarone  200 mg Oral Daily  . antiseptic oral rinse  7 mL Mouth Rinse BID  . aspirin EC  81 mg Oral Daily  . atorvastatin  80 mg Oral q1800  . cefTRIAXone (ROCEPHIN)  IV  1 g Intravenous Q24H  . diltiazem  240 mg Oral Daily  . heparin  5,000 Units Subcutaneous Q8H  . hydrocortisone sod succinate (SOLU-CORTEF) inj  50 mg Intravenous Q12H  . insulin aspart  0-5 Units Subcutaneous QHS  . insulin aspart  0-9 Units Subcutaneous TID WC  . pantoprazole  40 mg Oral BID  . sodium chloride  500 mL Intravenous Once   PRN Meds albuterol, ALPRAZolam, HYDROcodone-acetaminophen, nitroGLYCERIN, ondansetron (ZOFRAN) IV Results for orders placed or performed during the hospital encounter of 02/06/16 (from the past 48 hour(s))  MRSA PCR Screening     Status: None   Collection Time: 02/06/16 11:53 PM  Result Value Ref Range   MRSA by PCR NEGATIVE NEGATIVE    Comment:        The GeneXpert MRSA Assay (FDA approved for NASAL specimens only), is one component of a comprehensive MRSA colonization surveillance program. It is not intended to diagnose MRSA infection nor to guide or monitor treatment for MRSA  infections.   Comprehensive metabolic panel     Status: Abnormal   Collection Time: 02/07/16  2:56 AM  Result Value Ref Range   Sodium 137 135 - 145 mmol/L   Potassium 4.6 3.5 - 5.1 mmol/L   Chloride 99 (L) 101 - 111 mmol/L   CO2 24 22 - 32 mmol/L   Glucose, Bld 127 (H) 65 - 99 mg/dL   BUN 20 6 - 20 mg/dL   Creatinine, Ser 1.80 (H) 0.44 - 1.00 mg/dL   Calcium  8.5 (L) 8.9 - 10.3 mg/dL   Total Protein 7.0 6.5 - 8.1 g/dL   Albumin 2.4 (L) 3.5 - 5.0 g/dL   AST 22 15 - 41 U/L   ALT 21 14 - 54 U/L   Alkaline Phosphatase 71 38 - 126 U/L   Total Bilirubin 0.3 0.3 - 1.2 mg/dL   GFR calc non Af Amer 26 (L) >60 mL/min   GFR calc Af Amer 30 (L) >60 mL/min    Comment: (NOTE) The eGFR has been calculated using the CKD EPI equation. This calculation has not been validated in all clinical situations. eGFR's persistently <60 mL/min signify possible Chronic Kidney Disease.    Anion gap 14 5 - 15  Magnesium     Status: Abnormal   Collection Time: 02/07/16  2:56 AM  Result Value Ref Range   Magnesium 2.7 (H) 1.7 - 2.4 mg/dL  TSH     Status: None   Collection Time: 02/07/16  2:56 AM  Result Value Ref Range   TSH 0.773 0.350 - 4.500 uIU/mL  Troponin I     Status: Abnormal   Collection Time: 02/07/16  2:56 AM  Result Value Ref Range   Troponin I 0.06 (H) <0.031 ng/mL    Comment:        PERSISTENTLY INCREASED TROPONIN VALUES IN THE RANGE OF 0.04-0.49 ng/mL CAN BE SEEN IN:       -UNSTABLE ANGINA       -CONGESTIVE HEART FAILURE       -MYOCARDITIS       -CHEST TRAUMA       -ARRYHTHMIAS       -LATE PRESENTING MYOCARDIAL INFARCTION       -COPD   CLINICAL FOLLOW-UP RECOMMENDED.   Protime-INR     Status: None   Collection Time: 02/07/16  2:56 AM  Result Value Ref Range   Prothrombin Time 14.5 11.6 - 15.2 seconds   INR 1.11 0.00 - 1.49  CBC WITH DIFFERENTIAL     Status: Abnormal   Collection Time: 02/07/16  2:56 AM  Result Value Ref Range   WBC 17.4 (H) 4.0 - 10.5 K/uL   RBC 3.79 (L)  3.87 - 5.11 MIL/uL   Hemoglobin 10.4 (L) 12.0 - 15.0 g/dL   HCT 33.6 (L) 36.0 - 46.0 %   MCV 88.7 78.0 - 100.0 fL   MCH 27.4 26.0 - 34.0 pg   MCHC 31.0 30.0 - 36.0 g/dL   RDW 16.6 (H) 11.5 - 15.5 %   Platelets 233 150 - 400 K/uL   Neutrophils Relative % 87 %   Neutro Abs 15.1 (H) 1.7 - 7.7 K/uL   Lymphocytes Relative 7 %   Lymphs Abs 1.3 0.7 - 4.0 K/uL   Monocytes Relative 6 %   Monocytes Absolute 1.0 0.1 - 1.0 K/uL   Eosinophils Relative 0 %   Eosinophils Absolute 0.0 0.0 - 0.7 K/uL   Basophils Relative 0 %   Basophils Absolute 0.0 0.0 - 0.1 K/uL  APTT     Status: None   Collection Time: 02/07/16  2:56 AM  Result Value Ref Range   aPTT 30 24 - 37 seconds  Lipid panel     Status: Abnormal   Collection Time: 02/07/16  2:56 AM  Result Value Ref Range   Cholesterol 153 0 - 200 mg/dL   Triglycerides 106 <150 mg/dL   HDL 38 (L) >40 mg/dL   Total CHOL/HDL Ratio 4.0 RATIO   VLDL 21 0 - 40 mg/dL  LDL Cholesterol 94 0 - 99 mg/dL    Comment:        Total Cholesterol/HDL:CHD Risk Coronary Heart Disease Risk Table                     Men   Women  1/2 Average Risk   3.4   3.3  Average Risk       5.0   4.4  2 X Average Risk   9.6   7.1  3 X Average Risk  23.4   11.0        Use the calculated Patient Ratio above and the CHD Risk Table to determine the patient's CHD Risk.        ATP III CLASSIFICATION (LDL):  <100     mg/dL   Optimal  100-129  mg/dL   Near or Above                    Optimal  130-159  mg/dL   Borderline  160-189  mg/dL   High  >190     mg/dL   Very High   Culture, blood (Routine X 2) w Reflex to ID Panel     Status: None (Preliminary result)   Collection Time: 02/07/16  3:34 AM  Result Value Ref Range   Specimen Description BLOOD RIGHT WRIST    Special Requests IN PEDIATRIC BOTTLE 1ML    Culture NO GROWTH 1 DAY    Report Status PENDING   Culture, blood (Routine X 2) w Reflex to ID Panel     Status: None (Preliminary result)   Collection Time: 02/07/16   4:30 AM  Result Value Ref Range   Specimen Description BLOOD LEFT HAND    Special Requests IN PEDIATRIC BOTTLE 2CC    Culture NO GROWTH 1 DAY    Report Status PENDING   Hemoglobin A1c     Status: Abnormal   Collection Time: 02/07/16  4:37 AM  Result Value Ref Range   Hgb A1c MFr Bld 8.4 (H) 4.8 - 5.6 %    Comment: (NOTE)         Pre-diabetes: 5.7 - 6.4         Diabetes: >6.4         Glycemic control for adults with diabetes: <7.0    Mean Plasma Glucose 194 mg/dL    Comment: (NOTE) Performed At: [facility] [place], [place] [id number] Lindon Romp MD OF:[id number]   Urinalysis, Routine w reflex microscopic (not at [facility])     Status: Abnormal   Collection Time: 02/07/16  7:07 AM  Result Value Ref Range   Color, Urine YELLOW YELLOW   APPearance CLOUDY (A) CLEAR   Specific Gravity, Urine 1.021 1.005 - 1.030   pH 6.5 5.0 - 8.0   Glucose, UA 500 (A) NEGATIVE mg/dL   Hgb urine dipstick SMALL (A) NEGATIVE   Bilirubin Urine NEGATIVE NEGATIVE   Ketones, ur NEGATIVE NEGATIVE mg/dL   Protein, ur 30 (A) NEGATIVE mg/dL   Nitrite NEGATIVE NEGATIVE   Leukocytes, UA MODERATE (A) NEGATIVE  Urine microscopic-add on     Status: Abnormal   Collection Time: 02/07/16  7:07 AM  Result Value Ref Range   Squamous Epithelial / LPF 6-30 (A) NONE SEEN   WBC, UA TOO NUMEROUS TO COUNT 0 - 5 WBC/hpf   RBC / HPF 0-5 0 - 5 RBC/hpf   Bacteria, UA MANY (A) NONE SEEN   Casts GRANULAR CAST (  A) NEGATIVE  Troponin I     Status: Abnormal   Collection Time: 02/07/16  8:01 AM  Result Value Ref Range   Troponin I 0.07 (H) <0.031 ng/mL    Comment:        PERSISTENTLY INCREASED TROPONIN VALUES IN THE RANGE OF 0.04-0.49 ng/mL CAN BE SEEN IN:       -UNSTABLE ANGINA       -CONGESTIVE HEART FAILURE       -MYOCARDITIS       -CHEST TRAUMA       -ARRYHTHMIAS       -LATE PRESENTING MYOCARDIAL INFARCTION       -COPD   CLINICAL FOLLOW-UP RECOMMENDED.   Basic metabolic panel     Status:  Abnormal   Collection Time: 02/07/16  8:01 AM  Result Value Ref Range   Sodium 140 135 - 145 mmol/L   Potassium 4.6 3.5 - 5.1 mmol/L   Chloride 101 101 - 111 mmol/L   CO2 25 22 - 32 mmol/L   Glucose, Bld 99 65 - 99 mg/dL   BUN 20 6 - 20 mg/dL   Creatinine, Ser 1.79 (H) 0.44 - 1.00 mg/dL   Calcium 8.5 (L) 8.9 - 10.3 mg/dL   GFR calc non Af Amer 26 (L) >60 mL/min   GFR calc Af Amer 30 (L) >60 mL/min    Comment: (NOTE) The eGFR has been calculated using the CKD EPI equation. This calculation has not been validated in all clinical situations. eGFR's persistently <60 mL/min signify possible Chronic Kidney Disease.    Anion gap 14 5 - 15  CBC     Status: Abnormal   Collection Time: 02/07/16  8:01 AM  Result Value Ref Range   WBC 15.8 (H) 4.0 - 10.5 K/uL   RBC 3.68 (L) 3.87 - 5.11 MIL/uL   Hemoglobin 10.1 (L) 12.0 - 15.0 g/dL   HCT 32.8 (L) 36.0 - 46.0 %   MCV 89.1 78.0 - 100.0 fL   MCH 27.4 26.0 - 34.0 pg   MCHC 30.8 30.0 - 36.0 g/dL   RDW 16.7 (H) 11.5 - 15.5 %   Platelets 249 150 - 400 K/uL  Glucose, capillary     Status: None   Collection Time: 02/07/16  8:29 AM  Result Value Ref Range   Glucose-Capillary 94 65 - 99 mg/dL   Comment 1 Capillary Specimen   Brain natriuretic peptide     Status: Abnormal   Collection Time: 02/07/16  8:52 AM  Result Value Ref Range   B Natriuretic Peptide 195.9 (H) 0.0 - 100.0 pg/mL  Glucose, capillary     Status: None   Collection Time: 02/07/16 11:43 AM  Result Value Ref Range   Glucose-Capillary 82 65 - 99 mg/dL   Comment 1 Capillary Specimen   Glucose, capillary     Status: None   Collection Time: 02/07/16  2:24 PM  Result Value Ref Range   Glucose-Capillary 79 65 - 99 mg/dL  Troponin I     Status: Abnormal   Collection Time: 02/07/16  2:43 PM  Result Value Ref Range   Troponin I 0.06 (H) <0.031 ng/mL    Comment:        PERSISTENTLY INCREASED TROPONIN VALUES IN THE RANGE OF 0.04-0.49 ng/mL CAN BE SEEN IN:       -UNSTABLE ANGINA        -CONGESTIVE HEART FAILURE       -MYOCARDITIS       -CHEST TRAUMA       -  ARRYHTHMIAS       -LATE PRESENTING MYOCARDIAL INFARCTION       -COPD   CLINICAL FOLLOW-UP RECOMMENDED.   Glucose, capillary     Status: Abnormal   Collection Time: 02/07/16  4:30 PM  Result Value Ref Range   Glucose-Capillary 119 (H) 65 - 99 mg/dL  Glucose, capillary     Status: Abnormal   Collection Time: 02/07/16  9:57 PM  Result Value Ref Range   Glucose-Capillary 140 (H) 65 - 99 mg/dL  Glucose, capillary     Status: Abnormal   Collection Time: 02/08/16  7:41 AM  Result Value Ref Range   Glucose-Capillary 124 (H) 65 - 99 mg/dL  Glucose, capillary     Status: Abnormal   Collection Time: 02/08/16 11:41 AM  Result Value Ref Range   Glucose-Capillary 123 (H) 65 - 99 mg/dL  Glucose, capillary     Status: Abnormal   Collection Time: 02/08/16  4:16 PM  Result Value Ref Range   Glucose-Capillary 172 (H) 65 - 99 mg/dL    Dg Chest 2 View  02/08/2016  CLINICAL DATA:  80 year old female status post pacemaker insertion. EXAM: CHEST  2 VIEW COMPARISON:  Preoperative chest x-ray 02/06/2016 ; chest CT 01/10/2014 FINDINGS: Interval plasma left subclavian approach cardiac rhythm maintenance device with leads projecting over the right atrium and right ventricle. No evidence of pneumothorax, new pleural effusion or other complicating feature. Stable mild cardiomegaly. Atherosclerotic and tortuous thoracic aorta. Bibasilar opacities favored to reflect atelectasis. There is a nodular opacity in the left lower lobe. On the prior chest x-ray this was favored to represent atelectasis. However, on the prior chest CT from 01/10/2014 a small pulmonary nodule was noted in this location. IMPRESSION: 1. Status post insertion of a left subclavian approach cardiac rhythm maintenance device without evidence of immediate complication. 2. Left lower lobe nodular opacity which appears to have enlarged comparing across modalities to the prior  CT scan of the chest dated 01/10/2014. This is concerning for an enlarging pulmonary nodule and further evaluation with repeat CT scan of the chest is recommended. These results will be called to the ordering clinician or representative by the Radiologist Assistant, and communication documented in the PACS or zVision Dashboard. Electronically Signed   By: Jacqulynn Cadet M.D.   On: 02/08/2016 07:45   Ct Head Wo Contrast  02/07/2016  CLINICAL DATA:  Initial evaluation for acute lightheadedness, loss of consciousness. EXAM: CT HEAD WITHOUT CONTRAST TECHNIQUE: Contiguous axial images were obtained from the base of the skull through the vertex without intravenous contrast. COMPARISON:  Prior CT from 01/14/2016. FINDINGS: Generalized cerebral atrophy with mild chronic small vessel ischemic disease present, stable from prior. Vascular calcifications present within the carotid siphons. No acute intracranial hemorrhage. No acute large vessel territory infarct. No mass lesion, midline shift, or mass effect. Mild ventricular prominence related global parenchymal volume loss without hydrocephalus. No extra-axial fluid collection. Scalp soft tissues within normal limits. No acute abnormality about the globes or orbits. Patient is status post bilateral cataract extraction. Paranasal sinuses are clear.  No mastoid effusion. Calvarium intact. IMPRESSION: 1. No acute intracranial process. 2. Generalized cerebral atrophy with mild chronic small vessel ischemic disease, stable. Electronically Signed   By: Jeannine Boga M.D.   On: 02/07/2016 03:43   Ct Chest Wo Contrast  02/08/2016  CLINICAL DATA:  Patient had a masslike opacity seen all recent chest radiography. Patient was admitted for pacemaker insertion. Patient denies chest pain. EXAM: CT CHEST WITHOUT CONTRAST  TECHNIQUE: Multidetector CT imaging of the chest was performed following the standard protocol without IV contrast. COMPARISON:  Prior chest radiographs.   Prior chest CT, 01/10/2014. FINDINGS: Neck base and axilla:  No mass or adenopathy. Mediastinum and hila: Heart normal in size and configuration. New pacemaker leads project in the right atrium and right ventricle. There are mild coronary artery calcifications. No mediastinal or hilar masses. There are several mildly prominent lymph nodes the largest measuring 1 cm short axis, a right precarinal node. Lungs and pleural: In the anteromedial basal segment of the left lower lobe, with there is a mass with irregular margins and bulges the oblique fissure anteriorly. The mass measures 4.4 x 3.0 x 4.3 cm. There is some additional opacity along the inferior margin of this that is likely atelectasis. The anteromedial basal segment bronchus is occluded as it enters the mass. There are no other masses. There are no suspicious nodules. Subsegmental atelectasis is noted at the lung bases. There is mild scarring in the apices. There are mild changes of centrilobular emphysema most evident in the upper lobes. No pleural effusion. No pneumothorax. No evidence of pneumonia or edema. Limited upper abdomen: Partly imaged low-density left kidney upper pole lesion consistent with a cyst. No adrenal masses. No liver lesions on the included field of view. Musculoskeletal:  No osteoblastic or osteolytic lesions. IMPRESSION: 1. 4.4 cm left lower lobe mass suspicious for bronchogenic carcinoma. Tissue sampling is recommended. 2. No acute findings. Electronically Signed   By: Lajean Manes M.D.   On: 02/08/2016 15:08   Dg Chest Portable 1 View  02/06/2016  CLINICAL DATA:  Altered mental status. Syncope and vomiting at dinner tonight, PT is s/p code and CPR, verbal at this time, but confused EXAM: PORTABLE CHEST 1 VIEW COMPARISON:  01/14/2016 FINDINGS: Normal heart size and pulmonary vascularity. Shallow inspiration with linear atelectasis in the lung bases. No definite consolidation. The area of previous consolidation in the left lower  lobe is today obscured by overlying EKG patches and I cannot tell whether it remains or has cleared. No blunting of costophrenic angles. No pneumothorax. Tortuous aorta. IMPRESSION: Shallow inspiration with linear atelectasis in the lung bases. Correlation with prior study is limited due to overlying EKG patches. Electronically Signed   By: Lucienne Capers M.D.   On: 02/06/2016 21:58   Dg Abd Portable 1v  02/08/2016  CLINICAL DATA:  Nausea EXAM: PORTABLE ABDOMEN - 1 VIEW COMPARISON:  06/03/2015 CT abdomen/ pelvis. FINDINGS: Partially visualized pacemaker leads overlie the right atrium and right ventricle. No dilated small bowel loops. Mild colonic stool volume. No evidence of pneumatosis, pneumoperitoneum or pathologic soft tissue calcification. IMPRESSION: Nonobstructive bowel gas pattern. Electronically Signed   By: Ilona Sorrel M.D.   On: 02/08/2016 17:10               Blood pressure 133/86, pulse 107, temperature 99.7 F (37.6 C), temperature source Oral, resp. rate 13, height 5' 6"  (1.676 m), weight 82.963 kg (182 lb 14.4 oz), SpO2 97 %.  Physical exam:   General-- obese African-American female ENT-- nonicteric  Heart-- regular rate and rhythm without murmurs are gallops Lungs-- clear Abdomen-- soft and nontender    Assessment: 1. Nausea and vomiting. Etiology of this is unclear and could be related to poor cardiac function. Agree with him. PPI therapy. Discussed EGD. Patient is not interested 2. Family history of colon cancer. She should've had colon cancer screening but has refused. She is concerned about eating sedated and  does appear to have a lot of severe cardiopulmonary problems. 3. LLL mass probably malignant. 4. Heart block. New pacemaker inserted 5. Multiple prior cardiac diagnoses  Plan: I discussed colonoscopy with patient and told her that we would be happy to do that. Based on her cardiac and pulmonary status it should only be done in the hospital. It is  always possible that the mass in her lungs metastatic Colon however she also has been a smoker for many years. She stated she will consider colonoscopy but at this point in time does not feel that she is interested.   Ulysses Alper JR,[place] Alvira L 02/08/2016, 6:06 PM   This note was created using voice recognition software and minor errors may Have occurred unintentionally. Pager: [phone] If no answer or after hours call [phone]

## 2016-02-08 NOTE — Progress Notes (Signed)
Patient ID: [name] [name], female   DOB: [birth year], 80 y.o.   MRN: [id number]    Patient Name: [name] [name] Date of Encounter: 02/08/2016     Principal Problem:   Complete heart block ([place]) Active Problems:   TOBACCO ABUSE   Essential hypertension   Hyperlipidemia   Smoker   Weakness   Chronic combined systolic and diastolic CHF, NYHA class 1 (HCC)   Malnutrition of moderate degree    SUBJECTIVE  No complaints after PPM. Denies chest pain or sob. No significant pain.  CURRENT MEDS . antiseptic oral rinse  7 mL Mouth Rinse BID  . aspirin EC  81 mg Oral Daily  . atorvastatin  80 mg Oral q1800  . heparin  5,000 Units Subcutaneous Q8H  . pantoprazole  40 mg Oral BID  . sodium chloride flush  3 mL Intravenous Q12H    OBJECTIVE  Filed Vitals:   02/07/16 1800 02/07/16 2007 02/08/16 0500 02/08/16 0800  BP: 131/70 129/73 160/92 133/86  Pulse: 77 89 100 107  Temp:  98.3 F (36.8 C) 98 F (36.7 C)   TempSrc:  Oral Oral   Resp: '15 15 16 13  '$ Height:      Weight:   182 lb 14.4 oz (82.963 kg)   SpO2: 97% 96% 96% 97%    Intake/Output Summary (Last 24 hours) at 02/08/16 0808 Last data filed at 02/08/16 0800  Gross per 24 hour  Intake 368.75 ml  Output    300 ml  Net  68.75 ml   Filed Weights   02/07/16 0000 02/07/16 0351 02/08/16 0500  Weight: 183 lb 3.2 oz (83.1 kg) 183 lb 13.8 oz (83.4 kg) 182 lb 14.4 oz (82.963 kg)    PHYSICAL EXAM  General: Pleasant, NAD. Neuro: Alert and oriented X 3. Moves all extremities spontaneously. Psych: Normal affect. HEENT:  Normal  Neck: Supple without bruits or JVD. Lungs:  Resp regular and unlabored, CTA. Left chest without hematoma. Heart: RRR no s3, s4, or murmurs. Abdomen: Soft, non-tender, non-distended, BS + x 4.  Extremities: No clubbing, cyanosis or edema. DP/PT/Radials 2+ and equal bilaterally.  Accessory Clinical Findings  CBC  Recent Labs  02/06/16 2050 02/07/16 0256 02/07/16 0801  WBC 15.7* 17.4*  15.8*  NEUTROABS 12.7* 15.1*  --   HGB 11.0* 10.4* 10.1*  HCT 35.3 33.6* 32.8*  MCV 86.7 88.7 89.1  PLT 294 233 010   Basic Metabolic Panel  Recent Labs  02/07/16 0256 02/07/16 0801  NA 137 140  K 4.6 4.6  CL 99* 101  CO2 24 25  GLUCOSE 127* 99  BUN 20 20  CREATININE 1.80* 1.79*  CALCIUM 8.5* 8.5*  MG 2.7*  --    Liver Function Tests  Recent Labs  02/07/16 0256  AST 22  ALT 21  ALKPHOS 71  BILITOT 0.3  PROT 7.0  ALBUMIN 2.4*   No results for input(s): LIPASE, AMYLASE in the last 72 hours. Cardiac Enzymes  Recent Labs  02/07/16 0256 02/07/16 0801 02/07/16 1443  TROPONINI 0.06* 0.07* 0.06*   BNP Invalid input(s): POCBNP D-Dimer No results for input(s): DDIMER in the last 72 hours. Hemoglobin A1C  Recent Labs  02/07/16 0437  HGBA1C 8.4*   Fasting Lipid Panel  Recent Labs  02/07/16 0256  CHOL 153  HDL 38*  LDLCALC 94  TRIG 106  CHOLHDL 4.0   Thyroid Function Tests  Recent Labs  02/07/16 0256  TSH 0.773    TELE  Nsr/sinus tachycardia  Radiology/Studies  Dg Chest 2 View  02/08/2016  CLINICAL DATA:  80 year old female status post pacemaker insertion. EXAM: CHEST  2 VIEW COMPARISON:  Preoperative chest x-ray 02/06/2016 ; chest CT 01/10/2014 FINDINGS: Interval plasma left subclavian approach cardiac rhythm maintenance device with leads projecting over the right atrium and right ventricle. No evidence of pneumothorax, new pleural effusion or other complicating feature. Stable mild cardiomegaly. Atherosclerotic and tortuous thoracic aorta. Bibasilar opacities favored to reflect atelectasis. There is a nodular opacity in the left lower lobe. On the prior chest x-ray this was favored to represent atelectasis. However, on the prior chest CT from 01/10/2014 a small pulmonary nodule was noted in this location. IMPRESSION: 1. Status post insertion of a left subclavian approach cardiac rhythm maintenance device without evidence of immediate  complication. 2. Left lower lobe nodular opacity which appears to have enlarged comparing across modalities to the prior CT scan of the chest dated 01/10/2014. This is concerning for an enlarging pulmonary nodule and further evaluation with repeat CT scan of the chest is recommended. These results will be called to the ordering clinician or representative by the Radiologist Assistant, and communication documented in the PACS or zVision Dashboard. Electronically Signed   By: Jacqulynn Cadet M.D.   On: 02/08/2016 07:45   Dg Chest 2 View  01/14/2016  CLINICAL DATA:  80 year old female with a history of weakness. Congestive heart failure history. History of smoking. EXAM: CHEST - 2 VIEW COMPARISON:  Plain film 07/30/2015, 06/27/2015, CT 01/10/2014 FINDINGS: Cardiomediastinal silhouette is unchanged. Hazy opacity on the anterior view corresponds to wedge-shaped opacity on the lateral view. Nodule identified on prior chest CT of 01/10/2014 not as well visualized on the current study. Right lung relatively well aerated. No large pleural effusion.  No pneumothorax. No displaced fracture. Unremarkable appearance of the upper abdomen. IMPRESSION: Wedge-shaped opacity of the left lung, compatible with partial left lower lobe volume loss. Obstructing mass cannot be excluded, and further evaluation with a contrast-enhanced chest CT is recommended. These results will be called to the ordering clinician or representative by the Radiologist Assistant, and communication documented in the PACS or zVision Dashboard. Signed, Dulcy Fanny. Earleen Newport, DO [facility] Radiology Electronically Signed   By: Corrie Mckusick D.O.   On: 01/14/2016 16:56   Ct Head Wo Contrast  02/07/2016  CLINICAL DATA:  Initial evaluation for acute lightheadedness, loss of consciousness. EXAM: CT HEAD WITHOUT CONTRAST TECHNIQUE: Contiguous axial images were obtained from the base of the skull through the vertex  without intravenous contrast. COMPARISON:  Prior CT from 01/14/2016. FINDINGS: Generalized cerebral atrophy with mild chronic small vessel ischemic disease present, stable from prior. Vascular calcifications present within the carotid siphons. No acute intracranial hemorrhage. No acute large vessel territory infarct. No mass lesion, midline shift, or mass effect. Mild ventricular prominence related global parenchymal volume loss without hydrocephalus. No extra-axial fluid collection. Scalp soft tissues within normal limits. No acute abnormality about the globes or orbits. Patient is status post bilateral cataract extraction. Paranasal sinuses are clear.  No mastoid effusion. Calvarium intact. IMPRESSION: 1. No acute intracranial process. 2. Generalized cerebral atrophy with mild chronic small vessel ischemic disease, stable. Electronically Signed   By: Jeannine Boga M.D.   On: 02/07/2016 03:43   Ct Head Wo Contrast  01/14/2016  CLINICAL DATA:  Multiple falls, including yesterday. Intermittent headache. EXAM: CT HEAD WITHOUT CONTRAST TECHNIQUE: Contiguous axial images were obtained from the base of the skull through the vertex without intravenous  contrast. COMPARISON:  09/30/2013. FINDINGS: Diffusely enlarged ventricles and subarachnoid spaces. Minimal bilateral frontal periventricular white matter low density. No skull fracture, intracranial hemorrhage, mass lesion, CT evidence of acute infarction or paranasal sinus air-fluid levels. IMPRESSION: 1. No acute abnormality. 2. Mild diffuse cerebral and cerebellar atrophy. 3. Minimal chronic small vessel white matter ischemic changes in both cerebral hemispheres. Electronically Signed   By: Claudie Revering M.D.   On: 01/14/2016 20:57   Dg Chest Portable 1 View  02/06/2016  CLINICAL DATA:  Altered mental status. Syncope and vomiting at dinner tonight, PT is s/p code and CPR, verbal at this time, but confused EXAM: PORTABLE CHEST 1 VIEW COMPARISON:  01/14/2016  FINDINGS: Normal heart size and pulmonary vascularity. Shallow inspiration with linear atelectasis in the lung bases. No definite consolidation. The area of previous consolidation in the left lower lobe is today obscured by overlying EKG patches and I cannot tell whether it remains or has cleared. No blunting of costophrenic angles. No pneumothorax. Tortuous aorta. IMPRESSION: Shallow inspiration with linear atelectasis in the lung bases. Correlation with prior study is limited due to overlying EKG patches. Electronically Signed   By: Lucienne Capers M.D.   On: 02/06/2016 21:58    ASSESSMENT AND PLAN  1. Intermittent CHB - she is s/p PPM insertion.  2. PPM -her cxr looks good and interogation is normal this a.m. She will be discharged home with usual followup 3. SVT - she has sinus tachy this morning which may be related to withdrawal of her medications (cardizem and amio). Will restart. 4. Disp. - she will followup for an incision check in our [address] office and then f/o Dr. Renaldo Reel in [place].  Joshwa Hemric,M.D.  02/08/2016 8:08 AM

## 2016-02-08 NOTE — Care Management Note (Addendum)
Case Management Note  Patient Details  Name: [name] [name] MRN: [id number] Date of Birth: May 06, 1936  Subjective/Objective:  Pt admitted for syncope CHB- post pacemaker. Pt is from home with daughter. Daughter takes pt to all MD appointments. Pt has DME [place]ane at home.                   Action/Plan: CM will continue to monitor for disposition needs.    Expected Discharge Date:                  Expected Discharge Plan:  [place]  In-House Referral:  NA  Discharge planning Services  CM Consult  Post Acute Care Choice:   Home with [place] and Durable medical Equipment Choice offered to:   Patient and Adult Child  DME Arranged:   Hospital Bed, 3n1 DME Agency:   [place] Arranged:   Physical Therapy HH Agency:   [facility].  Status of Service:  In process, will continue to follow  Medicare Important Message Given:  Yes Date Medicare IM Given:    Medicare IM give by:    Date Additional Medicare IM Given:    Additional Medicare Important Message give by:     If discussed at [place] of Stay Meetings, dates discussed:    Additional Comments: 2482 02-08-16 CM did speak with pt and daughter in regards to [facility] needs. Recommendations for HHPT Services. CM provided agency list and pt chose [facility]. Referral made to [facility] and [facility] to begin within 24-48 hours post d/c. DME Hospital Bed and 3n1 order given to [facility] as well. No further needs from this CM.   Bethena Roys, RN 02/08/2016, 12:26 PM

## 2016-02-09 LAB — CBC
HCT: 32.5 % — ABNORMAL LOW (ref 36.0–46.0)
HEMOGLOBIN: 9.7 g/dL — AB (ref 12.0–15.0)
MCH: 25.9 pg — AB (ref 26.0–34.0)
MCHC: 29.8 g/dL — AB (ref 30.0–36.0)
MCV: 86.7 fL (ref 78.0–100.0)
Platelets: 245 10*3/uL (ref 150–400)
RBC: 3.75 MIL/uL — AB (ref 3.87–5.11)
RDW: 16.4 % — ABNORMAL HIGH (ref 11.5–15.5)
WBC: 13.7 10*3/uL — ABNORMAL HIGH (ref 4.0–10.5)

## 2016-02-09 LAB — COMPREHENSIVE METABOLIC PANEL
ALBUMIN: 2.2 g/dL — AB (ref 3.5–5.0)
ALT: 10 U/L — AB (ref 14–54)
ANION GAP: 14 (ref 5–15)
AST: 13 U/L — ABNORMAL LOW (ref 15–41)
Alkaline Phosphatase: 67 U/L (ref 38–126)
BUN: 13 mg/dL (ref 6–20)
CHLORIDE: 100 mmol/L — AB (ref 101–111)
CO2: 25 mmol/L (ref 22–32)
CREATININE: 1.47 mg/dL — AB (ref 0.44–1.00)
Calcium: 8.7 mg/dL — ABNORMAL LOW (ref 8.9–10.3)
GFR calc non Af Amer: 33 mL/min — ABNORMAL LOW (ref >60)
GFR, EST AFRICAN AMERICAN: 39 mL/min — AB (ref >60)
GLUCOSE: 136 mg/dL — AB (ref 65–99)
Potassium: 3.8 mmol/L (ref 3.5–5.1)
SODIUM: 139 mmol/L (ref 135–145)
Total Bilirubin: 0.6 mg/dL (ref 0.3–1.2)
Total Protein: 6.5 g/dL (ref 6.5–8.1)

## 2016-02-09 LAB — HEMOGLOBIN A1C
Hgb A1c MFr Bld: 8.2 % — ABNORMAL HIGH (ref 4.8–5.6)
MEAN PLASMA GLUCOSE: 189 mg/dL

## 2016-02-09 LAB — GLUCOSE, CAPILLARY: Glucose-Capillary: 139 mg/dL — ABNORMAL HIGH (ref 65–99)

## 2016-02-09 LAB — LIPASE, BLOOD: Lipase: 21 U/L (ref 11–51)

## 2016-02-09 MED ORDER — DILTIAZEM HCL ER COATED BEADS 240 MG PO CP24: 240.0000 mg | ORAL_CAPSULE | Freq: Every day | ORAL | Status: AC

## 2016-02-09 MED ORDER — CEFPODOXIME PROXETIL 100 MG PO TABS: 200.0000 mg | ORAL_TABLET | Freq: Two times a day (BID) | ORAL | Status: AC

## 2016-02-09 MED ORDER — AMIODARONE HCL 200 MG PO TABS: 200.0000 mg | ORAL_TABLET | Freq: Every day | ORAL | Status: AC

## 2016-02-09 MED ORDER — ATORVASTATIN CALCIUM 80 MG PO TABS: 80.0000 mg | ORAL_TABLET | Freq: Every day | ORAL | Status: AC

## 2016-02-09 MED ORDER — PREDNISONE 20 MG PO TABS: 20.0000 mg | ORAL_TABLET | Freq: Every day | ORAL | Status: DC

## 2016-02-09 NOTE — Progress Notes (Signed)
Patient ID: [name] [name], female   DOB: [birth year], 80 y.o.   MRN: [id number]    Patient Name: [name] [name] Date of Encounter: 02/09/2016     Principal Problem:   Complete heart block ([place]) Active Problems:   TOBACCO ABUSE   Chronic combined systolic and diastolic CHF (congestive heart failure) (HCC)   Essential hypertension   Hyperlipidemia   Centrilobular emphysema (HCC)   Smoker   Weakness   Chronic combined systolic and diastolic CHF, NYHA class 1 (HCC)   Malnutrition of moderate degree   Lung mass    SUBJECTIVE  No complaints after PPM. Denies chest pain or sob. No significant pain.  CURRENT MEDS . alum & mag hydroxide-simeth  30 mL Oral TID  . amiodarone  200 mg Oral Daily  . antiseptic oral rinse  7 mL Mouth Rinse BID  . aspirin EC  81 mg Oral Daily  . atorvastatin  80 mg Oral q1800  . cefTRIAXone (ROCEPHIN)  IV  1 g Intravenous Q24H  . diltiazem  240 mg Oral Daily  . heparin  5,000 Units Subcutaneous Q8H  . hydrocortisone sod succinate (SOLU-CORTEF) inj  50 mg Intravenous Q12H  . insulin aspart  0-5 Units Subcutaneous QHS  . insulin aspart  0-9 Units Subcutaneous TID WC  . pantoprazole  40 mg Oral BID    OBJECTIVE  Filed Vitals:   02/08/16 0800 02/08/16 1420 02/08/16 2035 02/09/16 0500  BP: 133/86  125/43 106/22  Pulse: 107  93 89  Temp:  99.7 F (37.6 C) 99.3 F (37.4 C) 99.2 F (37.3 C)  TempSrc:  Oral Axillary   Resp: '13  16 22  '$ Height:      Weight:    179 lb 12.8 oz (81.557 kg)  SpO2: 97%  97% 97%    Intake/Output Summary (Last 24 hours) at 02/09/16 0810 Last data filed at 02/09/16 0500  Gross per 24 hour  Intake  542.5 ml  Output    300 ml  Net  242.5 ml   Filed Weights   02/07/16 0351 02/08/16 0500 02/09/16 0500  Weight: 183 lb 13.8 oz (83.4 kg) 182 lb 14.4 oz (82.963 kg) 179 lb 12.8 oz (81.557 kg)    PHYSICAL EXAM  General: Pleasant, NAD. Neuro: Alert and oriented X 3. Moves all extremities spontaneously. Psych:  Normal affect. HEENT:  Normal  Neck: Supple without bruits or JVD. Lungs:  Resp regular and unlabored, CTA. Left chest without hematoma. Heart: RRR no s3, s4, or murmurs. Abdomen: Soft, non-tender, non-distended, BS + x 4.  Extremities: No clubbing, cyanosis or edema. DP/PT/Radials 2+ and equal bilaterally.  Accessory Clinical Findings  CBC  Recent Labs  02/06/16 2050 02/07/16 0256 02/07/16 0801 02/09/16 0617  WBC 15.7* 17.4* 15.8* 13.7*  NEUTROABS 12.7* 15.1*  --   --   HGB 11.0* 10.4* 10.1* 9.7*  HCT 35.3 33.6* 32.8* 32.5*  MCV 86.7 88.7 89.1 86.7  PLT 294 233 249 299   Basic Metabolic Panel  Recent Labs  02/07/16 0256 02/07/16 0801 02/09/16 0617  NA 137 140 139  K 4.6 4.6 3.8  CL 99* 101 100*  CO2 '24 25 25  '$ GLUCOSE 127* 99 136*  BUN '20 20 13  '$ CREATININE 1.80* 1.79* 1.47*  CALCIUM 8.5* 8.5* 8.7*  MG 2.7*  --   --    Liver Function Tests  Recent Labs  02/07/16 0256 02/09/16 0617  AST 22 13*  ALT 21 10*  ALKPHOS 71 67  BILITOT  0.3 0.6  PROT 7.0 6.5  ALBUMIN 2.4* 2.2*    Recent Labs  02/09/16 0617  LIPASE 21   Cardiac Enzymes  Recent Labs  02/07/16 0256 02/07/16 0801 02/07/16 1443  TROPONINI 0.06* 0.07* 0.06*   BNP Invalid input(s): POCBNP D-Dimer No results for input(s): DDIMER in the last 72 hours. Hemoglobin A1C  Recent Labs  02/08/16 1659  HGBA1C 8.2*   Fasting Lipid Panel  Recent Labs  02/07/16 0256  CHOL 153  HDL 38*  LDLCALC 94  TRIG 106  CHOLHDL 4.0   Thyroid Function Tests  Recent Labs  02/07/16 0256  TSH 0.773    TELE  Nsr/sinus tachycardia  Radiology/Studies  Dg Chest 2 View  02/08/2016  CLINICAL DATA:  80 year old female status post pacemaker insertion. EXAM: CHEST  2 VIEW COMPARISON:  Preoperative chest x-ray 02/06/2016 ; chest CT 01/10/2014 FINDINGS: Interval plasma left subclavian approach cardiac rhythm maintenance device with leads projecting over the right atrium and right ventricle. No  evidence of pneumothorax, new pleural effusion or other complicating feature. Stable mild cardiomegaly. Atherosclerotic and tortuous thoracic aorta. Bibasilar opacities favored to reflect atelectasis. There is a nodular opacity in the left lower lobe. On the prior chest x-ray this was favored to represent atelectasis. However, on the prior chest CT from 01/10/2014 a small pulmonary nodule was noted in this location. IMPRESSION: 1. Status post insertion of a left subclavian approach cardiac rhythm maintenance device without evidence of immediate complication. 2. Left lower lobe nodular opacity which appears to have enlarged comparing across modalities to the prior CT scan of the chest dated 01/10/2014. This is concerning for an enlarging pulmonary nodule and further evaluation with repeat CT scan of the chest is recommended. These results will be called to the ordering clinician or representative by the Radiologist Assistant, and communication documented in the PACS or zVision Dashboard. Electronically Signed   By: Jacqulynn Cadet M.D.   On: 02/08/2016 07:45   Dg Chest 2 View  01/14/2016  CLINICAL DATA:  80 year old female with a history of weakness. Congestive heart failure history. History of smoking. EXAM: CHEST - 2 VIEW COMPARISON:  Plain film 07/30/2015, 06/27/2015, CT 01/10/2014 FINDINGS: Cardiomediastinal silhouette is unchanged. Hazy opacity on the anterior view corresponds to wedge-shaped opacity on the lateral view. Nodule identified on prior chest CT of 01/10/2014 not as well visualized on the current study. Right lung relatively well aerated. No large pleural effusion.  No pneumothorax. No displaced fracture. Unremarkable appearance of the upper abdomen. IMPRESSION: Wedge-shaped opacity of the left lung, compatible with partial left lower lobe volume loss. Obstructing mass cannot be excluded, and further evaluation with a contrast-enhanced chest CT is recommended. These results will be called to the  ordering clinician or representative by the Radiologist Assistant, and communication documented in the PACS or zVision Dashboard. Signed, Dulcy Fanny. Earleen Newport, DO [facility] Radiology Electronically Signed   By: Corrie Mckusick D.O.   On: 01/14/2016 16:56   Ct Head Wo Contrast  02/07/2016  CLINICAL DATA:  Initial evaluation for acute lightheadedness, loss of consciousness. EXAM: CT HEAD WITHOUT CONTRAST TECHNIQUE: Contiguous axial images were obtained from the base of the skull through the vertex without intravenous contrast. COMPARISON:  Prior CT from 01/14/2016. FINDINGS: Generalized cerebral atrophy with mild chronic small vessel ischemic disease present, stable from prior. Vascular calcifications present within the carotid siphons. No acute intracranial hemorrhage. No acute large vessel territory infarct. No mass lesion, midline shift, or mass effect. Mild  ventricular prominence related global parenchymal volume loss without hydrocephalus. No extra-axial fluid collection. Scalp soft tissues within normal limits. No acute abnormality about the globes or orbits. Patient is status post bilateral cataract extraction. Paranasal sinuses are clear.  No mastoid effusion. Calvarium intact. IMPRESSION: 1. No acute intracranial process. 2. Generalized cerebral atrophy with mild chronic small vessel ischemic disease, stable. Electronically Signed   By: Jeannine Boga M.D.   On: 02/07/2016 03:43   Ct Head Wo Contrast  01/14/2016  CLINICAL DATA:  Multiple falls, including yesterday. Intermittent headache. EXAM: CT HEAD WITHOUT CONTRAST TECHNIQUE: Contiguous axial images were obtained from the base of the skull through the vertex without intravenous contrast. COMPARISON:  09/30/2013. FINDINGS: Diffusely enlarged ventricles and subarachnoid spaces. Minimal bilateral frontal periventricular white matter low density. No skull fracture, intracranial hemorrhage, mass lesion, CT  evidence of acute infarction or paranasal sinus air-fluid levels. IMPRESSION: 1. No acute abnormality. 2. Mild diffuse cerebral and cerebellar atrophy. 3. Minimal chronic small vessel white matter ischemic changes in both cerebral hemispheres. Electronically Signed   By: Claudie Revering M.D.   On: 01/14/2016 20:57   Ct Chest Wo Contrast  02/08/2016  CLINICAL DATA:  Patient had a masslike opacity seen all recent chest radiography. Patient was admitted for pacemaker insertion. Patient denies chest pain. EXAM: CT CHEST WITHOUT CONTRAST TECHNIQUE: Multidetector CT imaging of the chest was performed following the standard protocol without IV contrast. COMPARISON:  Prior chest radiographs.  Prior chest CT, 01/10/2014. FINDINGS: Neck base and axilla:  No mass or adenopathy. Mediastinum and hila: Heart normal in size and configuration. New pacemaker leads project in the right atrium and right ventricle. There are mild coronary artery calcifications. No mediastinal or hilar masses. There are several mildly prominent lymph nodes the largest measuring 1 cm short axis, a right precarinal node. Lungs and pleural: In the anteromedial basal segment of the left lower lobe, with there is a mass with irregular margins and bulges the oblique fissure anteriorly. The mass measures 4.4 x 3.0 x 4.3 cm. There is some additional opacity along the inferior margin of this that is likely atelectasis. The anteromedial basal segment bronchus is occluded as it enters the mass. There are no other masses. There are no suspicious nodules. Subsegmental atelectasis is noted at the lung bases. There is mild scarring in the apices. There are mild changes of centrilobular emphysema most evident in the upper lobes. No pleural effusion. No pneumothorax. No evidence of pneumonia or edema. Limited upper abdomen: Partly imaged low-density left kidney upper pole lesion consistent with a cyst. No adrenal masses. No liver lesions on the included field of view.  Musculoskeletal:  No osteoblastic or osteolytic lesions. IMPRESSION: 1. 4.4 cm left lower lobe mass suspicious for bronchogenic carcinoma. Tissue sampling is recommended. 2. No acute findings. Electronically Signed   By: Lajean Manes M.D.   On: 02/08/2016 15:08   US Renal  02/08/2016  CLINICAL DATA:  Acute renal failure.  Inpatient. EXAM: RENAL / URINARY TRACT ULTRASOUND COMPLETE COMPARISON:  06/03/2015 CT abdomen/ pelvis. FINDINGS: Right Kidney: Length: 11.2 cm. Mildly echogenic right kidney. Normal right renal parenchymal thickness. No right hydronephrosis. No right renal mass. Left Kidney: Length: 11.5 cm. Mildly echogenic left kidney. Normal left renal parenchymal thickness. No left hydronephrosis. Simple appearing 2.2 x 2.1 x 2.2 cm renal cyst in the upper left kidney. Bladder: Appears normal for degree of bladder distention. IMPRESSION: 1. No hydronephrosis. 2. Echogenic normal size kidneys, indicating nonspecific renal parenchymal disease of  uncertain chronicity. 3. Normal bladder. Electronically Signed   By: Ilona Sorrel M.D.   On: 02/08/2016 19:02   Dg Chest Portable 1 View  02/06/2016  CLINICAL DATA:  Altered mental status. Syncope and vomiting at dinner tonight, PT is s/p code and CPR, verbal at this time, but confused EXAM: PORTABLE CHEST 1 VIEW COMPARISON:  01/14/2016 FINDINGS: Normal heart size and pulmonary vascularity. Shallow inspiration with linear atelectasis in the lung bases. No definite consolidation. The area of previous consolidation in the left lower lobe is today obscured by overlying EKG patches and I cannot tell whether it remains or has cleared. No blunting of costophrenic angles. No pneumothorax. Tortuous aorta. IMPRESSION: Shallow inspiration with linear atelectasis in the lung bases. Correlation with prior study is limited due to overlying EKG patches. Electronically Signed   By: Lucienne Capers M.D.   On: 02/06/2016 21:58   Dg Abd Portable 1v  02/08/2016  CLINICAL DATA:   Nausea EXAM: PORTABLE ABDOMEN - 1 VIEW COMPARISON:  06/03/2015 CT abdomen/ pelvis. FINDINGS: Partially visualized pacemaker leads overlie the right atrium and right ventricle. No dilated small bowel loops. Mild colonic stool volume. No evidence of pneumatosis, pneumoperitoneum or pathologic soft tissue calcification. IMPRESSION: Nonobstructive bowel gas pattern. Electronically Signed   By: Ilona Sorrel M.D.   On: 02/08/2016 17:10    ASSESSMENT AND PLAN  1. Intermittent CHB - she is s/p PPM insertion. her cxr looks good and interogation is normal yesterday AM.  Follow up in device clinic in 10 days, then with Dr. Caryl Comes in [place].   2.  SVT - she has sinus rhythm this morning. Has been restarted on diltiazem and amiodarone 3. Lung mass: wishes to be followed up in pulmonary clinic for possible biopsy as possibly cancerous by CT scanning.  Son has phone number to pulmonary clinic per pulmonary note. 4. Disp. - she will followup for an incision check in our [address] office and then f/o Dr. Renaldo Reel in [place]. 5. UTI: per general medicine   Will Camnitz,M.D. 02/09/2016 8:10 AM

## 2016-02-09 NOTE — Discharge Instructions (Signed)
° ° °  Supplemental Discharge Instructions for  Pacemaker/Defibrillator Patients  Activity No heavy lifting or vigorous activity with your left/right arm for 6 to 8 weeks.  Do not raise your left/right arm above your head for one week.  Gradually raise your affected arm as drawn below.           __       02/12/16                       02/13/16                    02/14/16               02/15/16  NO DRIVING for   1 week  ; you may begin driving on   09/22/73  .  WOUND CARE - Keep the wound area clean and dry.  Do not get this area wet for one week. No showers for one week; you may shower on   02/15/16  . - The tape/steri-strips on your wound will fall off; do not pull them off.  No bandage is needed on the site.  DO  NOT apply any creams, oils, or ointments to the wound area. - If you notice any drainage or discharge from the wound, any swelling or bruising at the site, or you develop a fever > 101? F after you are discharged home, call the office at once.  Special Instructions - You are still able to use cellular telephones; use the ear opposite the side where you have your pacemaker/defibrillator.  Avoid carrying your cellular phone near your device. - When traveling through airports, show security personnel your identification card to avoid being screened in the metal detectors.  Ask the security personnel to use the hand wand. - Avoid arc welding equipment, TENS units (transcutaneous nerve stimulators).  Call the office for questions about other devices. - Avoid electrical appliances that are in poor condition or are not properly grounded. - Microwave ovens are safe to be near or to operate.

## 2016-02-09 NOTE — Progress Notes (Signed)
Consult PROGRESS NOTE                                                                                                                                                                                                             Patient Demographics:    [name] [name], is a 80 y.o. female, DOB - October 30, 1935, OVF:[id number]  Admit date - 02/06/2016   Admitting Physician Evans Lance, MD  Outpatient Primary MD for the patient is Juanell Fairly, MD  LOS - 3  No chief complaint on file.        Subjective:    [name] [name] today has, No headache, No chest pain, No abdominal pain - No Nausea, No new weakness tingling or numbness, No Cough - SOB.      Assessment  & Plan :     Medicine consult progress note.   1. UTI. Ruled out sepsis. Excellent response to empiric Rocephin and IV fluids. She is symptom-free without chills, requested cardiology to place her on Vantin 200 mg by mouth twice a day for 5 more days in case she is discharged. Blood cultures negative. Urine cultures pending.  2. ARF. Renal ultrasound stable, prerenal due to dehydration, much improved with IV fluids. Repeat BMP by PCP next visit avoid nephrotoxins.  3. Persistent nausea for several months. Remarkably improved and currently no nausea after she was placed on PPI, continue Protonix 40 by mouth twice a day upon discharge. Seen by either GI. Recommend follow-up with them after discharge.  4. Possible left upper lobe mass. Seen by pulmonary, patient wants to withhold on biopsy, suspicious for malignancy, follow-up with pulmonary.  5. Ongoing smoking. Counseled to quit.  6. Rule out arthritis. On chronic steroids, had placed her on low-dose IV hydrocortisone, now switched to prednisone 20 mg while here, can oh back to 10 mg daily dose upon discharge.  7. History of SVT, complete heart block.  Status post pacemaker placement. Defer management to primary service which is cardiology.   8. Anxiety. On Xanax continue.  9. Dyslipidemia. Continue statin  10. DM type II. Here on sliding scale, resume home regimen upon discharge follow with PCP for glycemic control.   DVT Prophylaxis  :    Heparin   Lab Results  Component Value Date   PLT 245 02/09/2016    Inpatient Medications  Scheduled Meds: . alum & mag hydroxide-simeth  30 mL Oral TID  . amiodarone  200 mg Oral Daily  .  antiseptic oral rinse  7 mL Mouth Rinse BID  . aspirin EC  81 mg Oral Daily  . atorvastatin  80 mg Oral q1800  . cefTRIAXone (ROCEPHIN)  IV  1 g Intravenous Q24H  . diltiazem  240 mg Oral Daily  . heparin  5,000 Units Subcutaneous Q8H  . hydrocortisone sod succinate (SOLU-CORTEF) inj  50 mg Intravenous Q12H  . insulin aspart  0-5 Units Subcutaneous QHS  . insulin aspart  0-9 Units Subcutaneous TID WC  . pantoprazole  40 mg Oral BID   Continuous Infusions: . sodium chloride 75 mL/hr at 02/09/16 0205   PRN Meds:.albuterol, ALPRAZolam, HYDROcodone-acetaminophen, nitroGLYCERIN, ondansetron (ZOFRAN) IV  Antibiotics  :    Anti-infectives    Start     Dose/Rate Route Frequency Ordered Stop   02/09/16 0000  cefpodoxime (VANTIN) 100 MG tablet     200 mg Oral 2 times daily 02/09/16 0920     02/08/16 1730  cefTRIAXone (ROCEPHIN) 1 g in dextrose 5 % 50 mL IVPB     1 g 100 mL/hr over 30 Minutes Intravenous Every 24 hours 02/08/16 1623 02/11/16 1729   02/07/16 1800  ceFAZolin (ANCEF) IVPB 1 g/50 mL premix     1 g 100 mL/hr over 30 Minutes Intravenous Every 12 hours 02/07/16 1420 02/08/16 0659   02/07/16 1230  gentamicin (GARAMYCIN) 80 mg in sodium chloride irrigation 0.9 % 500 mL irrigation     80 mg Irrigation To Cath Lab 02/07/16 1211 02/07/16 1328   02/07/16 1230  ceFAZolin (ANCEF) IVPB 2g/100 mL premix     2 g 200 mL/hr over 30 Minutes Intravenous To Cath Lab 02/07/16 1211 02/07/16 1315          Objective:   Filed Vitals:   02/08/16 1420 02/08/16 2035 02/09/16 0500 02/09/16 0846  BP:  125/43 106/22 118/58  Pulse:  93 89   Temp: 99.7 F (37.6 C) 99.3 F (37.4 C) 99.2 F (37.3 C)   TempSrc: Oral Axillary    Resp:  16 22   Height:      Weight:   81.557 kg (179 lb 12.8 oz)   SpO2:  97% 97%     Wt Readings from Last 3 Encounters:  02/09/16 81.557 kg (179 lb 12.8 oz)  01/14/16 87.091 kg (192 lb)  11/28/15 86.183 kg (190 lb)     Intake/Output Summary (Last 24 hours) at 02/09/16 1048 Last data filed at 02/09/16 0834  Gross per 24 hour  Intake  782.5 ml  Output    300 ml  Net  482.5 ml     Physical Exam  Awake Alert, Oriented X 3, No new F.N deficits, Normal affect [place].AT,PERRAL Supple Neck,No JVD, No cervical lymphadenopathy appriciated.  Symmetrical Chest wall movement, Good air movement bilaterally, CTAB RRR,No Gallops,Rubs or new Murmurs, No Parasternal Heave +ve B.Sounds, Abd Soft, No tenderness, No organomegaly appriciated, No rebound - guarding or rigidity. No Cyanosis, Clubbing or edema, No new Rash or bruise      Data Review:    CBC  Recent Labs Lab 02/06/16 2050 02/07/16 0256 02/07/16 0801 02/09/16 0617  WBC 15.7* 17.4* 15.8* 13.7*  HGB 11.0* 10.4* 10.1* 9.7*  HCT 35.3 33.6* 32.8* 32.5*  PLT 294 233 249 245  MCV 86.7 88.7 89.1 86.7  MCH 27.2 27.4 27.4 25.9*  MCHC 31.3* 31.0 30.8 29.8*  RDW 17.9* 16.6* 16.7* 16.4*  LYMPHSABS 2.3 1.3  --   --   MONOABS 0.7 1.0  --   --  EOSABS 0.0 0.0  --   --   BASOSABS 0.0 0.0  --   --     Chemistries   Recent Labs Lab 02/06/16 2050 02/07/16 0256 02/07/16 0801 02/09/16 0617  NA 135 137 140 139  K 5.7* 4.6 4.6 3.8  CL 102 99* 101 100*  CO2 21* '24 25 25  '$ GLUCOSE 340* 127* 99 136*  BUN '20 20 20 13  '$ CREATININE 1.92* 1.80* 1.79* 1.47*  CALCIUM 8.9 8.5* 8.5* 8.7*  MG  --  2.7*  --   --   AST  --  22  --  13*  ALT  --  21  --  10*  ALKPHOS  --  71  --  67  BILITOT  --  0.3  --  0.6    ------------------------------------------------------------------------------------------------------------------  Recent Labs  02/07/16 0256  CHOL 153  HDL 38*  LDLCALC 94  TRIG 106  CHOLHDL 4.0    Lab Results  Component Value Date   HGBA1C 8.2* 02/08/2016   ------------------------------------------------------------------------------------------------------------------  Recent Labs  02/07/16 0256  TSH 0.773   ------------------------------------------------------------------------------------------------------------------ No results for input(s): VITAMINB12, FOLATE, FERRITIN, TIBC, IRON, RETICCTPCT in the last 72 hours.  Coagulation profile  Recent Labs Lab 02/07/16 0256  INR 1.11    No results for input(s): DDIMER in the last 72 hours.  Cardiac Enzymes  Recent Labs Lab 02/07/16 0256 02/07/16 0801 02/07/16 1443  TROPONINI 0.06* 0.07* 0.06*   ------------------------------------------------------------------------------------------------------------------    Component Value Date/Time   BNP 195.9* 02/07/2016 0852   BNP 148 02/18/2014 2030    Micro Results Recent Results (from the past 240 hour(s))  MRSA PCR Screening     Status: None   Collection Time: 02/06/16 11:53 PM  Result Value Ref Range Status   MRSA by PCR NEGATIVE NEGATIVE Final    Comment:        The GeneXpert MRSA Assay (FDA approved for NASAL specimens only), is one component of a comprehensive MRSA colonization surveillance program. It is not intended to diagnose MRSA infection nor to guide or monitor treatment for MRSA infections.   Culture, blood (Routine X 2) w Reflex to ID Panel     Status: None (Preliminary result)   Collection Time: 02/07/16  3:34 AM  Result Value Ref Range Status   Specimen Description BLOOD RIGHT WRIST  Final   Special Requests IN PEDIATRIC BOTTLE 1ML  Final   Culture NO GROWTH 2 DAYS  Final   Report Status PENDING  Incomplete  Culture, blood  (Routine X 2) w Reflex to ID Panel     Status: None (Preliminary result)   Collection Time: 02/07/16  4:30 AM  Result Value Ref Range Status   Specimen Description BLOOD LEFT HAND  Final   Special Requests IN PEDIATRIC BOTTLE 2CC  Final   Culture NO GROWTH 2 DAYS  Final   Report Status PENDING  Incomplete    Radiology Reports Dg Chest 2 View  02/08/2016  CLINICAL DATA:  80 year old female status post pacemaker insertion. EXAM: CHEST  2 VIEW COMPARISON:  Preoperative chest x-ray 02/06/2016 ; chest CT 01/10/2014 FINDINGS: Interval plasma left subclavian approach cardiac rhythm maintenance device with leads projecting over the right atrium and right ventricle. No evidence of pneumothorax, new pleural effusion or other complicating feature. Stable mild cardiomegaly. Atherosclerotic and tortuous thoracic aorta. Bibasilar opacities favored to reflect atelectasis. There is a nodular opacity in the left lower lobe. On the prior chest x-ray this was favored to represent atelectasis.  However, on the prior chest CT from 01/10/2014 a small pulmonary nodule was noted in this location. IMPRESSION: 1. Status post insertion of a left subclavian approach cardiac rhythm maintenance device without evidence of immediate complication. 2. Left lower lobe nodular opacity which appears to have enlarged comparing across modalities to the prior CT scan of the chest dated 01/10/2014. This is concerning for an enlarging pulmonary nodule and further evaluation with repeat CT scan of the chest is recommended. These results will be called to the ordering clinician or representative by the Radiologist Assistant, and communication documented in the PACS or zVision Dashboard. Electronically Signed   By: Jacqulynn Cadet M.D.   On: 02/08/2016 07:45   Dg Chest 2 View  01/14/2016  CLINICAL DATA:  80 year old female with a history of weakness. Congestive heart failure history. History of smoking. EXAM: CHEST - 2 VIEW COMPARISON:  Plain  film 07/30/2015, 06/27/2015, CT 01/10/2014 FINDINGS: Cardiomediastinal silhouette is unchanged. Hazy opacity on the anterior view corresponds to wedge-shaped opacity on the lateral view. Nodule identified on prior chest CT of 01/10/2014 not as well visualized on the current study. Right lung relatively well aerated. No large pleural effusion.  No pneumothorax. No displaced fracture. Unremarkable appearance of the upper abdomen. IMPRESSION: Wedge-shaped opacity of the left lung, compatible with partial left lower lobe volume loss. Obstructing mass cannot be excluded, and further evaluation with a contrast-enhanced chest CT is recommended. These results will be called to the ordering clinician or representative by the Radiologist Assistant, and communication documented in the PACS or zVision Dashboard. Signed, Dulcy Fanny. Earleen Newport, DO [facility] Radiology Electronically Signed   By: Corrie Mckusick D.O.   On: 01/14/2016 16:56   Ct Head Wo Contrast  02/07/2016  CLINICAL DATA:  Initial evaluation for acute lightheadedness, loss of consciousness. EXAM: CT HEAD WITHOUT CONTRAST TECHNIQUE: Contiguous axial images were obtained from the base of the skull through the vertex without intravenous contrast. COMPARISON:  Prior CT from 01/14/2016. FINDINGS: Generalized cerebral atrophy with mild chronic small vessel ischemic disease present, stable from prior. Vascular calcifications present within the carotid siphons. No acute intracranial hemorrhage. No acute large vessel territory infarct. No mass lesion, midline shift, or mass effect. Mild ventricular prominence related global parenchymal volume loss without hydrocephalus. No extra-axial fluid collection. Scalp soft tissues within normal limits. No acute abnormality about the globes or orbits. Patient is status post bilateral cataract extraction. Paranasal sinuses are clear.  No mastoid effusion. Calvarium intact. IMPRESSION: 1. No  acute intracranial process. 2. Generalized cerebral atrophy with mild chronic small vessel ischemic disease, stable. Electronically Signed   By: Jeannine Boga M.D.   On: 02/07/2016 03:43   Ct Head Wo Contrast  01/14/2016  CLINICAL DATA:  Multiple falls, including yesterday. Intermittent headache. EXAM: CT HEAD WITHOUT CONTRAST TECHNIQUE: Contiguous axial images were obtained from the base of the skull through the vertex without intravenous contrast. COMPARISON:  09/30/2013. FINDINGS: Diffusely enlarged ventricles and subarachnoid spaces. Minimal bilateral frontal periventricular white matter low density. No skull fracture, intracranial hemorrhage, mass lesion, CT evidence of acute infarction or paranasal sinus air-fluid levels. IMPRESSION: 1. No acute abnormality. 2. Mild diffuse cerebral and cerebellar atrophy. 3. Minimal chronic small vessel white matter ischemic changes in both cerebral hemispheres. Electronically Signed   By: Claudie Revering M.D.   On: 01/14/2016 20:57   Ct Chest Wo Contrast  02/08/2016  CLINICAL DATA:  Patient had a masslike opacity seen all recent chest radiography. Patient was  admitted for pacemaker insertion. Patient denies chest pain. EXAM: CT CHEST WITHOUT CONTRAST TECHNIQUE: Multidetector CT imaging of the chest was performed following the standard protocol without IV contrast. COMPARISON:  Prior chest radiographs.  Prior chest CT, 01/10/2014. FINDINGS: Neck base and axilla:  No mass or adenopathy. Mediastinum and hila: Heart normal in size and configuration. New pacemaker leads project in the right atrium and right ventricle. There are mild coronary artery calcifications. No mediastinal or hilar masses. There are several mildly prominent lymph nodes the largest measuring 1 cm short axis, a right precarinal node. Lungs and pleural: In the anteromedial basal segment of the left lower lobe, with there is a mass with irregular margins and bulges the oblique fissure anteriorly. The  mass measures 4.4 x 3.0 x 4.3 cm. There is some additional opacity along the inferior margin of this that is likely atelectasis. The anteromedial basal segment bronchus is occluded as it enters the mass. There are no other masses. There are no suspicious nodules. Subsegmental atelectasis is noted at the lung bases. There is mild scarring in the apices. There are mild changes of centrilobular emphysema most evident in the upper lobes. No pleural effusion. No pneumothorax. No evidence of pneumonia or edema. Limited upper abdomen: Partly imaged low-density left kidney upper pole lesion consistent with a cyst. No adrenal masses. No liver lesions on the included field of view. Musculoskeletal:  No osteoblastic or osteolytic lesions. IMPRESSION: 1. 4.4 cm left lower lobe mass suspicious for bronchogenic carcinoma. Tissue sampling is recommended. 2. No acute findings. Electronically Signed   By: Lajean Manes M.D.   On: 02/08/2016 15:08   US Renal  02/08/2016  CLINICAL DATA:  Acute renal failure.  Inpatient. EXAM: RENAL / URINARY TRACT ULTRASOUND COMPLETE COMPARISON:  06/03/2015 CT abdomen/ pelvis. FINDINGS: Right Kidney: Length: 11.2 cm. Mildly echogenic right kidney. Normal right renal parenchymal thickness. No right hydronephrosis. No right renal mass. Left Kidney: Length: 11.5 cm. Mildly echogenic left kidney. Normal left renal parenchymal thickness. No left hydronephrosis. Simple appearing 2.2 x 2.1 x 2.2 cm renal cyst in the upper left kidney. Bladder: Appears normal for degree of bladder distention. IMPRESSION: 1. No hydronephrosis. 2. Echogenic normal size kidneys, indicating nonspecific renal parenchymal disease of uncertain chronicity. 3. Normal bladder. Electronically Signed   By: Ilona Sorrel M.D.   On: 02/08/2016 19:02   Dg Chest Portable 1 View  02/06/2016  CLINICAL DATA:  Altered mental status. Syncope and vomiting at dinner tonight, PT is s/p code and CPR, verbal at this time, but confused EXAM:  PORTABLE CHEST 1 VIEW COMPARISON:  01/14/2016 FINDINGS: Normal heart size and pulmonary vascularity. Shallow inspiration with linear atelectasis in the lung bases. No definite consolidation. The area of previous consolidation in the left lower lobe is today obscured by overlying EKG patches and I cannot tell whether it remains or has cleared. No blunting of costophrenic angles. No pneumothorax. Tortuous aorta. IMPRESSION: Shallow inspiration with linear atelectasis in the lung bases. Correlation with prior study is limited due to overlying EKG patches. Electronically Signed   By: Lucienne Capers M.D.   On: 02/06/2016 21:58   Dg Abd Portable 1v  02/08/2016  CLINICAL DATA:  Nausea EXAM: PORTABLE ABDOMEN - 1 VIEW COMPARISON:  06/03/2015 CT abdomen/ pelvis. FINDINGS: Partially visualized pacemaker leads overlie the right atrium and right ventricle. No dilated small bowel loops. Mild colonic stool volume. No evidence of pneumatosis, pneumoperitoneum or pathologic soft tissue calcification. IMPRESSION: Nonobstructive bowel gas pattern. Electronically Signed  By: Ilona Sorrel M.D.   On: 02/08/2016 17:10    Time Spent in minutes  30   Lenix Kidd K M.D on 02/09/2016 at 10:48 AM  Between 7am to 7pm - Pager - [phone]  After 7pm go to [email or web address] - password [email or web address]  [facility] -  Office  [phone]

## 2016-02-09 NOTE — Discharge Summary (Signed)
Discharge Summary    Patient ID: [name] [name],  MRN: [id number], DOB/AGE: 78/78/1937 78 y.o.  Admit date: 02/06/2016 Discharge date: 02/09/2016  Primary Care Provider: Juanell [name] Primary Cardiologist:  [name] [name]  Discharge Diagnoses    Principal Problem:   Complete heart block [facility]) Active Problems:   TOBACCO ABUSE   Chronic combined systolic and diastolic CHF (congestive heart failure) (HCC)   Essential hypertension   Hyperlipidemia   Centrilobular emphysema (HCC)   Smoker   Weakness   Chronic combined systolic and diastolic CHF, NYHA class 1 (HCC)   Malnutrition of moderate degree   Lung mass   SVT  Allergies Allergies  Allergen Reactions  . Benadryl [Diphenhydramine Hcl] Hives  . Lisinopril Cough  . Percocet [Oxycodone-Acetaminophen] Other (See Comments)    Reaction:  Dizziness   . Sulfa Antibiotics Hives    Diagnostic Studies/Procedures    Procedures    Pacemaker Implant    Conclusion    SURGEON: [name] Peru, MD    PREPROCEDURE DIAGNOSIS: Symptomatic Bradycardia due to complete heart block   POSTPROCEDURE DIAGNOSIS: Same as preprocedure diagnosis   PROCEDURES:  1. Pacemaker implantation.    INTRODUCTION: [name] [name] is a 80 y.o. female with a history of bradycardia due to complete heart block who presents today for pacemaker implantation. She is on amiodarone and cardizem but has SVT and has refused catheter ablation. The patient reports intermittent episodes of dizziness over the past few months. No reversible causes have been identified. The patient therefore presents today for pacemaker implantation.    DESCRIPTION OF PROCEDURE: Informed written consent was obtained, and  the patient was brought to the electrophysiology lab in a fasting state. The patient required no sedation for the procedure today. The patients left chest was prepped and draped in the usual sterile fashion by the EP lab staff. The skin  overlying the left deltopectoral region was infiltrated with lidocaine for local analgesia. A 4-cm incision was made over the left deltopectoral region. A left subcutaneous pacemaker pocket was fashioned using a combination of sharp and blunt dissection. Electrocautery was required to assure hemostasis.   RA/RV Lead Placement: The left cephalic vein was therefore directly visualized and cannulated. Through the left cephalic vein, a St. Jude (serial number [id number]) right atrial lead and a St. Jude (serial number [id number]) right ventricular lead were advanced with fluoroscopic visualization into the right atrial appendage and right ventricular apical septal positions respectively. Initial atrial lead P- waves measured 2 mV with impedance of 517 ohms and a threshold of 1.9 V at 0.5 msec. Right ventricular lead R-waves measured 11 mV with an impedance of 704 ohms and a threshold of 1.1 V at 0.5 msec. Both leads were secured to the pectoralis fascia using #2-0 silk over the suture sleeves.   Device Placement: The leads were then connected to a St. Jude MRI compatible (serial number [id number]) pacemaker. The pocket was irrigated with copious gentamicin solution. The pacemaker was then placed into the pocket. The pocket was then closed in 2 layers with 2.0 Vicryl suture for the subcutaneous and subcuticular layers. Steri-Strips and a sterile dressing were then applied. There were no early apparent complications.    CONCLUSIONS:  1. Successful implantation of a St. Jude dual-chamber pacemaker for symptomatic bradycardia due to Complete heart block. 2. No early apparent complications.        [name] Peru, MD 02/07/2016    _____________   History of Present Illness  [name] [name] is a 80 yo woman with PMH of ongoing tobacco abuse, NSTEMI April 2015 with troponin to 0.23 2/2 SVT presumed, normal coronary arteries in 2008, combined chronic systolic and diastolic heart failure  with EF recovery, hypertension, COPD, dyslipidemia, rheumatoid arthritis, T2DM, GERD who has had multiple ER visits, loss of appetite, weight loss, abnormal chest x-ray findings [name] recent SVT managed with medications (he has not wanted ablation) who has had worse balance issues and development of nausea/vomiting frothy pink material and loss of consciousness today. She was found to have complete heart block at [facility], received brief period of transcutaneous pacing and transferred to [facility] for possible pacemaker. On arrival, she is sleepy and has some mild soreness in her chest but she is in sinus rhythm. Her family described her general decline over the past several months. Of note, she's had weight loss, poor taste, metallic taste in her month, abnormal chest x-rays, multiple visits to the ER and general malaise.   Hospital Course     Consultants: EP, IM, GI, Pulmonary  Patient underwent pacemaker implantation on 02/07/2016. The following day there were no complications noted on chest x-ray. The device was functioning normal.  She was restarted on Cardizem and amiodarone.  Physical therapy assessment recommended home health PT.  She had a CT of her chest which showed a 4.4 cm left lower lobe mass suspicious for bronchogenic carcinoma. There are no other acute findings. She was seen by pulmonary who recommended bronchoscopy with biopsy to determine the etiology of the mass. It was suspected that she knew about the mass and refused biopsy previously as well.  She'll need follow-up pulmonary 1-2 weeks. The son was instructed to call for follow-up. She was also seen by gastroenterology due to nausea and vomiting. PPI therapy was reinitiated which helped the symptoms. Patient was not interested in an EGD. She has refused colon screening for cancer does have family history of colon cancer. She was also seen by internal medicine who prescribed Vantin 200 mg twice daily for UTI. Blood cultures were  negative urine cultures pending. She also sent home on Protonix 40 mg twice daily. She should follow-up with GI. Follow-up appointments for electrophysiology up and arranged.  The patient was seen by Dr. Curt Bears who felt she was stable for DC home.  Home health PT was arranged.   _____________  Discharge Vitals Blood pressure 118/58, pulse 89, temperature 99.2 F (37.3 C), temperature source Axillary, resp. rate 22, height '5\' 6"'$  (1.676 m), weight 179 lb 12.8 oz (81.557 kg), SpO2 97 %.  Filed Weights   02/07/16 0351 02/08/16 0500 02/09/16 0500  Weight: 183 lb 13.8 oz (83.4 kg) 182 lb 14.4 oz (82.963 kg) 179 lb 12.8 oz (81.557 kg)    Labs & Radiologic Studies    CBC  Recent Labs  02/06/16 2050 02/07/16 0256 02/07/16 0801 02/09/16 0617  WBC 15.7* 17.4* 15.8* 13.7*  NEUTROABS 12.7* 15.1*  --   --   HGB 11.0* 10.4* 10.1* 9.7*  HCT 35.3 33.6* 32.8* 32.5*  MCV 86.7 88.7 89.1 86.7  PLT 294 233 249 275   Basic Metabolic Panel  Recent Labs  02/07/16 0256 02/07/16 0801 02/09/16 0617  NA 137 140 139  K 4.6 4.6 3.8  CL 99* 101 100*  CO2 '24 25 25  '$ GLUCOSE 127* 99 136*  BUN '20 20 13  '$ CREATININE 1.80* 1.79* 1.47*  CALCIUM 8.5* 8.5* 8.7*  MG 2.7*  --   --    Liver  Function Tests  Recent Labs  02/07/16 0256 02/09/16 0617  AST 22 13*  ALT 21 10*  ALKPHOS 71 67  BILITOT 0.3 0.6  PROT 7.0 6.5  ALBUMIN 2.4* 2.2*    Recent Labs  02/09/16 0617  LIPASE 21   Cardiac Enzymes  Recent Labs  02/07/16 0256 02/07/16 0801 02/07/16 1443  TROPONINI 0.06* 0.07* 0.06*   Hemoglobin A1C  Recent Labs  02/08/16 1659  HGBA1C 8.2*   Fasting Lipid Panel  Recent Labs  02/07/16 0256  CHOL 153  HDL 38*  LDLCALC 94  TRIG 106  CHOLHDL 4.0   Thyroid Function Tests  Recent Labs  02/07/16 0256  TSH 0.773   _____________  Dg Chest 2 View  02/08/2016  CLINICAL DATA:  80 year old female status post pacemaker insertion. EXAM: CHEST  2 VIEW COMPARISON:  Preoperative chest  x-ray 02/06/2016 ; chest CT 01/10/2014 FINDINGS: Interval plasma left subclavian approach cardiac rhythm maintenance device with leads projecting over the right atrium and right ventricle. No evidence of pneumothorax, new pleural effusion or other complicating feature. Stable mild cardiomegaly. Atherosclerotic and tortuous thoracic aorta. Bibasilar opacities favored to reflect atelectasis. There is a nodular opacity in the left lower lobe. On the prior chest x-ray this was favored to represent atelectasis. However, on the prior chest CT from 01/10/2014 a small pulmonary nodule was noted in this location. IMPRESSION: 1. Status post insertion of a left subclavian approach cardiac rhythm maintenance device without evidence of immediate complication. 2. Left lower lobe nodular opacity which appears to have enlarged comparing across modalities to the prior CT scan of the chest dated 01/10/2014. This is concerning for an enlarging pulmonary nodule and further evaluation with repeat CT scan of the chest is recommended. These results Nicolle Heward be called to the ordering clinician or representative by the Radiologist Assistant, and communication documented in the PACS or zVision Dashboard. Electronically Signed   By: Jacqulynn Cadet M.D.   On: 02/08/2016 07:45   Dg Chest 2 View  01/14/2016  CLINICAL DATA:  80 year old female with a history of weakness. Congestive heart failure history. History of smoking. EXAM: CHEST - 2 VIEW COMPARISON:  Plain film 07/30/2015, 06/27/2015, CT 01/10/2014 FINDINGS: Cardiomediastinal silhouette is unchanged. Hazy opacity on the anterior view corresponds to wedge-shaped opacity on the lateral view. Nodule identified on prior chest CT of 01/10/2014 not as well visualized on the current study. Right lung relatively well aerated. No large pleural effusion.  No pneumothorax. No displaced fracture. Unremarkable appearance of the upper abdomen. IMPRESSION: Wedge-shaped opacity of the left lung,  compatible with partial left lower lobe volume loss. Obstructing mass cannot be excluded, and further evaluation with a contrast-enhanced chest CT is recommended. These results Raunak Antuna be called to the ordering clinician or representative by the Radiologist Assistant, and communication documented in the PACS or zVision Dashboard. Signed, Dulcy Fanny. Earleen Newport, DO [facility] Radiology Electronically Signed   By: Corrie Mckusick D.O.   On: 01/14/2016 16:56   Ct Head Wo Contrast  02/07/2016  CLINICAL DATA:  Initial evaluation for acute lightheadedness, loss of consciousness. EXAM: CT HEAD WITHOUT CONTRAST TECHNIQUE: Contiguous axial images were obtained from the base of the skull through the vertex without intravenous contrast. COMPARISON:  Prior CT from 01/14/2016. FINDINGS: Generalized cerebral atrophy with mild chronic small vessel ischemic disease present, stable from prior. Vascular calcifications present within the carotid siphons. No acute intracranial hemorrhage. No acute large vessel territory infarct. No mass lesion, midline shift, or  mass effect. Mild ventricular prominence related global parenchymal volume loss without hydrocephalus. No extra-axial fluid collection. Scalp soft tissues within normal limits. No acute abnormality about the globes or orbits. Patient is status post bilateral cataract extraction. Paranasal sinuses are clear.  No mastoid effusion. Calvarium intact. IMPRESSION: 1. No acute intracranial process. 2. Generalized cerebral atrophy with mild chronic small vessel ischemic disease, stable. Electronically Signed   By: Jeannine Boga M.D.   On: 02/07/2016 03:43   Ct Head Wo Contrast  01/14/2016  CLINICAL DATA:  Multiple falls, including yesterday. Intermittent headache. EXAM: CT HEAD WITHOUT CONTRAST TECHNIQUE: Contiguous axial images were obtained from the base of the skull through the vertex without intravenous contrast. COMPARISON:   09/30/2013. FINDINGS: Diffusely enlarged ventricles and subarachnoid spaces. Minimal bilateral frontal periventricular white matter low density. No skull fracture, intracranial hemorrhage, mass lesion, CT evidence of acute infarction or paranasal sinus air-fluid levels. IMPRESSION: 1. No acute abnormality. 2. Mild diffuse cerebral and cerebellar atrophy. 3. Minimal chronic small vessel white matter ischemic changes in both cerebral hemispheres. Electronically Signed   By: Claudie Revering M.D.   On: 01/14/2016 20:57   Ct Chest Wo Contrast  02/08/2016  CLINICAL DATA:  Patient had a masslike opacity seen all recent chest radiography. Patient was admitted for pacemaker insertion. Patient denies chest pain. EXAM: CT CHEST WITHOUT CONTRAST TECHNIQUE: Multidetector CT imaging of the chest was performed following the standard protocol without IV contrast. COMPARISON:  Prior chest radiographs.  Prior chest CT, 01/10/2014. FINDINGS: Neck base and axilla:  No mass or adenopathy. Mediastinum and hila: Heart normal in size and configuration. New pacemaker leads project in the right atrium and right ventricle. There are mild coronary artery calcifications. No mediastinal or hilar masses. There are several mildly prominent lymph nodes the largest measuring 1 cm short axis, a right precarinal node. Lungs and pleural: In the anteromedial basal segment of the left lower lobe, with there is a mass with irregular margins and bulges the oblique fissure anteriorly. The mass measures 4.4 x 3.0 x 4.3 cm. There is some additional opacity along the inferior margin of this that is likely atelectasis. The anteromedial basal segment bronchus is occluded as it enters the mass. There are no other masses. There are no suspicious nodules. Subsegmental atelectasis is noted at the lung bases. There is mild scarring in the apices. There are mild changes of centrilobular emphysema most evident in the upper lobes. No pleural effusion. No pneumothorax.  No evidence of pneumonia or edema. Limited upper abdomen: Partly imaged low-density left kidney upper pole lesion consistent with a cyst. No adrenal masses. No liver lesions on the included field of view. Musculoskeletal:  No osteoblastic or osteolytic lesions. IMPRESSION: 1. 4.4 cm left lower lobe mass suspicious for bronchogenic carcinoma. Tissue sampling is recommended. 2. No acute findings. Electronically Signed   By: Lajean Manes M.D.   On: 02/08/2016 15:08   US Renal  02/08/2016  CLINICAL DATA:  Acute renal failure.  Inpatient. EXAM: RENAL / URINARY TRACT ULTRASOUND COMPLETE COMPARISON:  06/03/2015 CT abdomen/ pelvis. FINDINGS: Right Kidney: Length: 11.2 cm. Mildly echogenic right kidney. Normal right renal parenchymal thickness. No right hydronephrosis. No right renal mass. Left Kidney: Length: 11.5 cm. Mildly echogenic left kidney. Normal left renal parenchymal thickness. No left hydronephrosis. Simple appearing 2.2 x 2.1 x 2.2 cm renal cyst in the upper left kidney. Bladder: Appears normal for degree of bladder distention. IMPRESSION: 1. No hydronephrosis. 2. Echogenic normal size kidneys, indicating nonspecific renal  parenchymal disease of uncertain chronicity. 3. Normal bladder. Electronically Signed   By: Ilona Sorrel M.D.   On: 02/08/2016 19:02   Dg Chest Portable 1 View  02/06/2016  CLINICAL DATA:  Altered mental status. Syncope and vomiting at dinner tonight, PT is s/p code and CPR, verbal at this time, but confused EXAM: PORTABLE CHEST 1 VIEW COMPARISON:  01/14/2016 FINDINGS: Normal heart size and pulmonary vascularity. Shallow inspiration with linear atelectasis in the lung bases. No definite consolidation. The area of previous consolidation in the left lower lobe is today obscured by overlying EKG patches and I cannot tell whether it remains or has cleared. No blunting of costophrenic angles. No pneumothorax. Tortuous aorta. IMPRESSION: Shallow inspiration with linear atelectasis in the lung  bases. Correlation with prior study is limited due to overlying EKG patches. Electronically Signed   By: Lucienne Capers M.D.   On: 02/06/2016 21:58   Dg Abd Portable 1v  02/08/2016  CLINICAL DATA:  Nausea EXAM: PORTABLE ABDOMEN - 1 VIEW COMPARISON:  06/03/2015 CT abdomen/ pelvis. FINDINGS: Partially visualized pacemaker leads overlie the right atrium and right ventricle. No dilated small bowel loops. Mild colonic stool volume. No evidence of pneumatosis, pneumoperitoneum or pathologic soft tissue calcification. IMPRESSION: Nonobstructive bowel gas pattern. Electronically Signed   By: Ilona Sorrel M.D.   On: 02/08/2016 17:10   Disposition   Pt is being discharged home today in good condition.  Follow-up Plans & Appointments    Follow-up Information    Follow up with [facility].   Why:  Physical Therapy   Contact information:   [address] [place] [address] [phone]       Follow up with [facility].   Why:  Hospital Bed, 3n1   Contact information:   [address] [place] [address] [phone]       Follow up with [facility] Office On 02/21/2016.   Specialty:  Cardiology   Why:  at [facility] for wound check    Contact information:   [address], [place] [phone]     Discharge Instructions    Diet - low sodium heart healthy    Complete by:  As directed      Increase activity slowly    Complete by:  As directed            Discharge Medications   Discharge Medication List as of 02/09/2016  9:57 AM    START taking these medications   Details  atorvastatin (LIPITOR) 80 MG tablet Take 1 tablet (80 mg total) by mouth daily at 6 PM., Starting 02/09/2016, Until Discontinued, Normal    cefpodoxime (VANTIN) 100 MG tablet Take 2 tablets (200 mg total) by mouth 2 (two) times daily., Starting 02/09/2016, Until Discontinued, Normal      CONTINUE these  medications which have CHANGED   Details  amiodarone (PACERONE) 200 MG tablet Take 1 tablet (200 mg total) by mouth daily., Starting 02/09/2016, Until Discontinued, Normal    diltiazem (CARDIZEM CD) 240 MG 24 hr capsule Take 1 capsule (240 mg total) by mouth daily., Starting 02/09/2016, Until Discontinued, Normal      CONTINUE these medications which have NOT CHANGED   Details  aspirin 81 MG tablet Take 81 mg by mouth daily., Until Discontinued, Historical Med    LORazepam (ATIVAN) 0.5 MG tablet Take 0.5 mg by mouth every 8 (eight) hours as needed for anxiety.,  Until Discontinued, Historical Med    mirtazapine (REMERON) 15 MG tablet Take 1 tablet (15 mg total) by mouth at bedtime., Starting 11/14/2015, Until Discontinued, Normal    pantoprazole (PROTONIX) 40 MG tablet Take 40 mg by mouth 2 (two) times daily., Until Discontinued, Historical Med    traMADol (ULTRAM) 50 MG tablet Take 50 mg by mouth every 6 (six) hours as needed for moderate pain. , Until Discontinued, Historical Med    albuterol (PROVENTIL HFA;VENTOLIN HFA) 108 (90 Base) MCG/ACT inhaler Inhale 2 puffs into the lungs every 6 (six) hours as needed for wheezing or shortness of breath., Starting 11/14/2015, Until Discontinued, Normal    furosemide (LASIX) 20 MG tablet Take 1 tablet (20 mg total) by mouth 2 (two) times daily as needed for edema., Starting 02/15/2015, Until Discontinued, Normal    ondansetron (ZOFRAN) 4 MG tablet Take 4 mg by mouth every 8 (eight) hours as needed for nausea or vomiting., Until Discontinued, Historical Med      STOP taking these medications     predniSONE (DELTASONE) 10 MG tablet            Outstanding Labs/Studies     Duration of Discharge Encounter   Greater than 30 minutes including physician time.  Otilio Connors, [facility] 02/09/2016, 11:53 AM    I have seen and examined this patient with Tarri Fuller.  Agree with above, note added to reflect my findings.  On exam, regular rhythm,  no murmurs, lungs clear.  Had dual chamber pacemaker placed for heart block.  On CXR found to have mass with CT showing possibly carcinoma.  Does not wish to have biopsy at this time.  Device interrogation and CXR stable. Myeisha Kruser follow up in device clinic in 10 days. Also to have follow up in pulmonary clinic to discuss further treatment of lung mass.     Agron Swiney M. Georgina Krist MD 02/09/2016 1:26 PM

## 2016-02-10 LAB — URINE CULTURE

## 2016-02-11 ENCOUNTER — Telehealth: Payer: Self-pay | Admitting: Cardiovascular Disease

## 2016-02-11 ENCOUNTER — Telehealth: Payer: Self-pay | Admitting: Internal Medicine

## 2016-02-11 NOTE — Telephone Encounter (Signed)
Spoke with [facility] and let her know Dr Lovena Le does not order [facility] for ADL's.  She can call her PCP or general cardiologist

## 2016-02-11 NOTE — Telephone Encounter (Signed)
New messge     The home health nurse would like for the Md to call in a order for a [facility] pt having difficulty dressing self and doing daily activities.

## 2016-02-11 NOTE — Telephone Encounter (Signed)
PT therapist Junie Panning calling asking [place] to sign off so patient can get home health care as well therapy She is having some issues and can't do them alone.  Please advise.

## 2016-02-11 NOTE — Telephone Encounter (Signed)
Attempted to contact Erin to find out what she needs me to do. Received message on multiple attempts that call cannot be completed as dialed.

## 2016-02-12 LAB — CULTURE, BLOOD (ROUTINE X 2)
CULTURE: NO GROWTH
Culture: NO GROWTH

## 2016-02-12 NOTE — Progress Notes (Addendum)
Received telephone call from patient's daughter, Melina Modena on [id number] very upset that hospital bed has not been delivered to patient's home.  Rodena Piety states the HHPT has contacted [facility] on yesterday concerning hospital bed and she was informed, bed would be delivered on yesterday, and it was not.  Daughter stated patient has fallen out of her regular bed at home several times.  Attempted to instruct family to call and seek delivery date and time from [facility] at which time family became very upset and stated we needed to check since we ordered the equipment.  Again encouraged family to contact [facility].  I spoke with Anne Ng from [employer] and she stated the have received the order for the hospital bed and should be contacting family shortly to arrange for delivery time.  I called Rodena Piety, patient's daughter, back and notified her of [facility]'s response.  I also instructed Rodena Piety to contact the Cardiologist office to notify them patient had had several recent falls.  Sanda Linger

## 2016-02-21 ENCOUNTER — Ambulatory Visit: Payer: Medicare Other

## 2016-03-22 DEATH — deceased

## 2016-10-02 ENCOUNTER — Telehealth: Payer: Self-pay | Admitting: Cardiovascular Disease

## 2016-10-02 NOTE — Telephone Encounter (Signed)
3 attempts to schedule fu from recall list deleting recall.

## 2017-03-21 IMAGING — US US ABDOMEN COMPLETE
1 series · 13 of 25 positions shown · non-contrast
Comparison: Abdominal ultrasound 03/03/2014, multiple prior CT
exams

CLINICAL DATA: Abdominal discomfort, acute constipation, nausea and
vomiting for several years, melena, hypertension, COPD, GERD,
rheumatoid arthritis, renal insufficiency, smoker, collagen vascular
disease

EXAM:
ULTRASOUND ABDOMEN COMPLETE

[Series 1: us abdomen complete · 0.25mm/px · 13 of 93 slices shown]
[im 1/93]
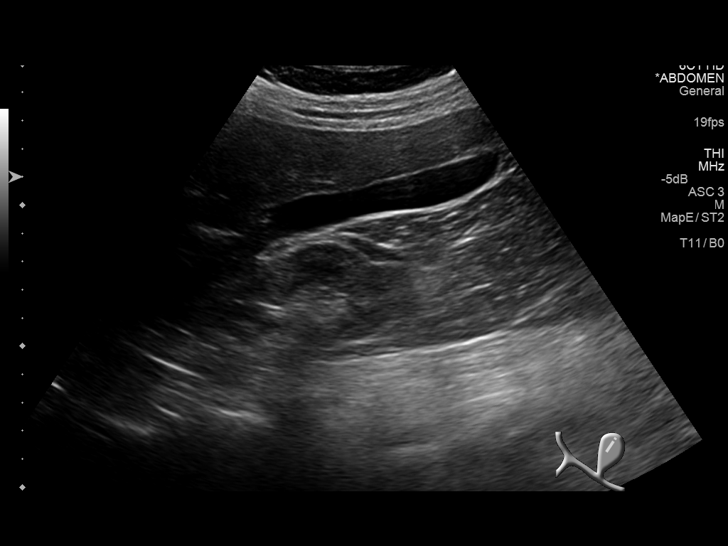
[im 8/93]
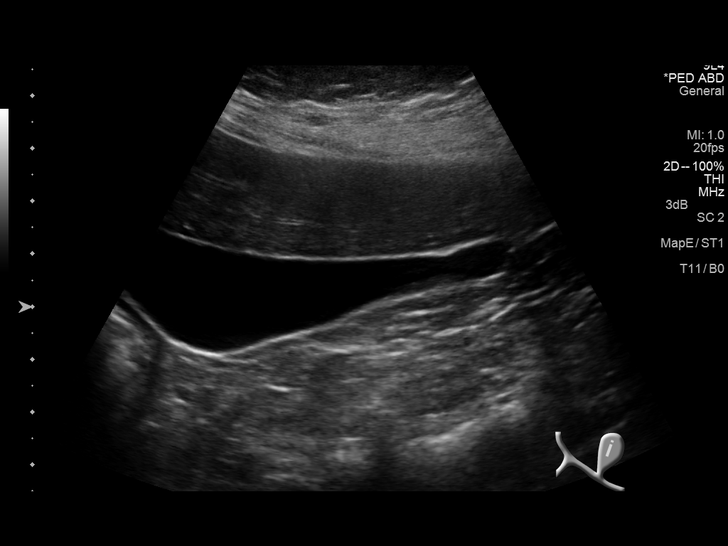
[im 16/93]
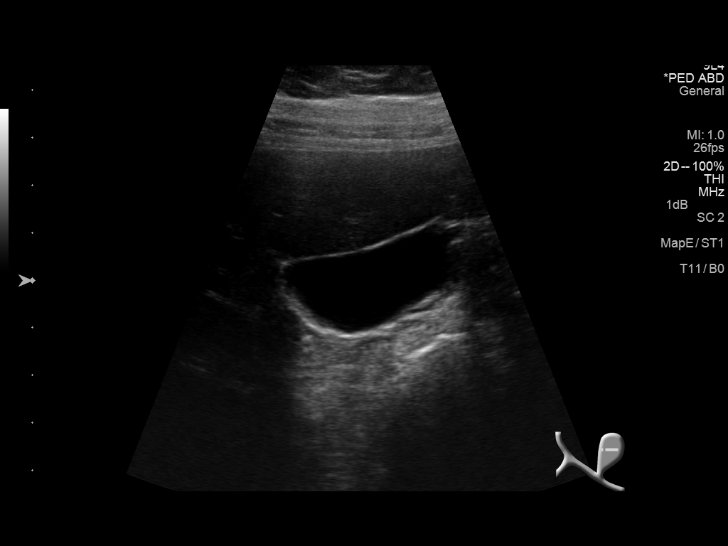
[im 24/93]
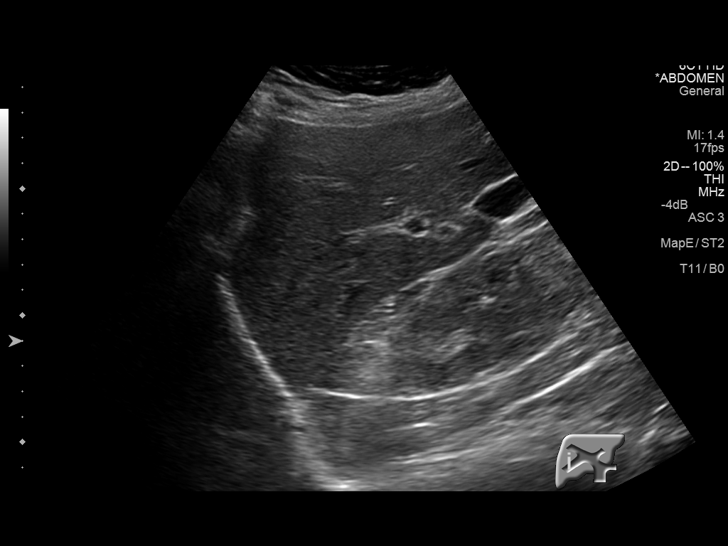
[im 31/93]
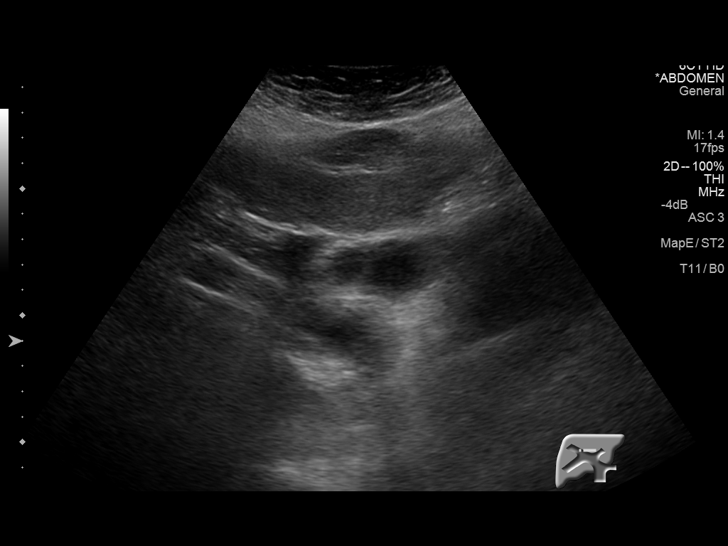
[im 39/93]
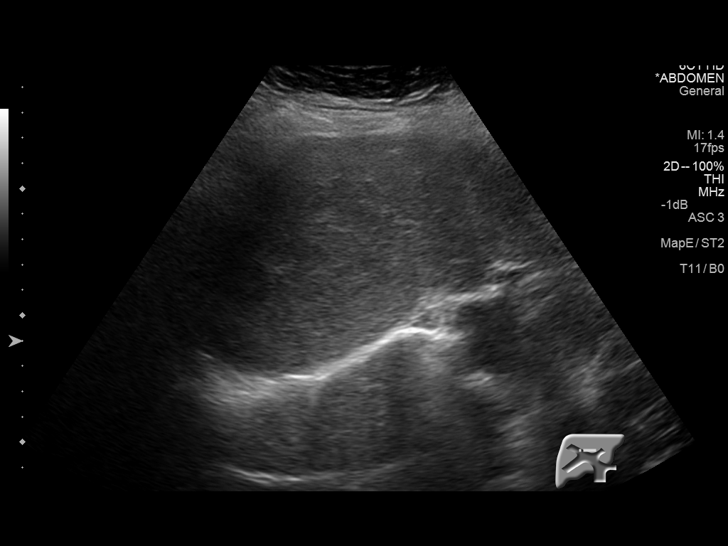
[im 47/93]
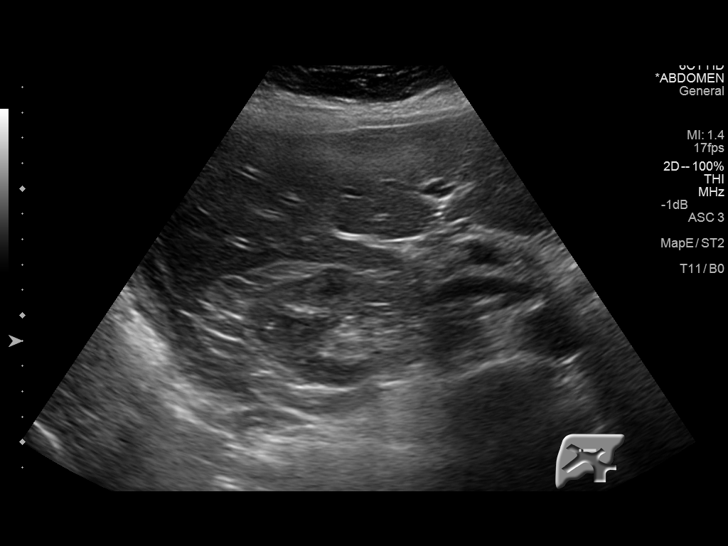
[im 54/93]
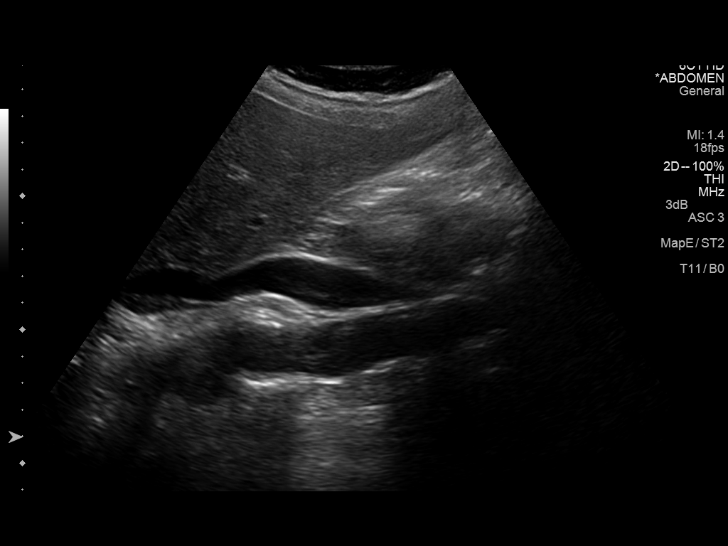
[im 62/93]
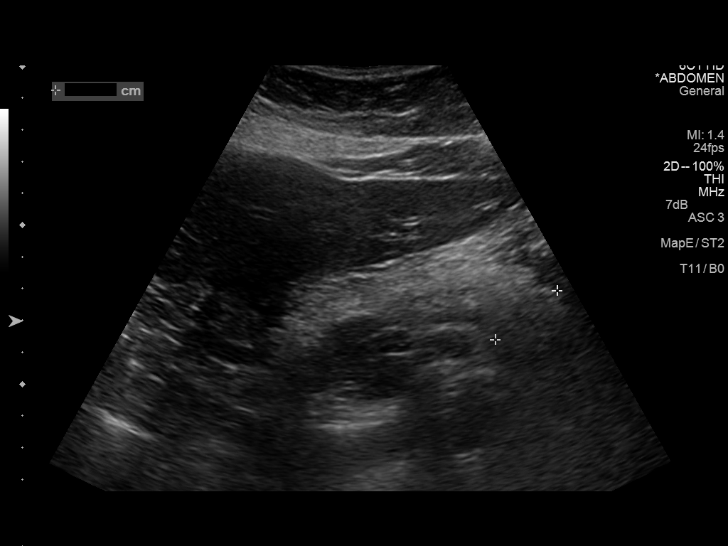
[im 70/93]
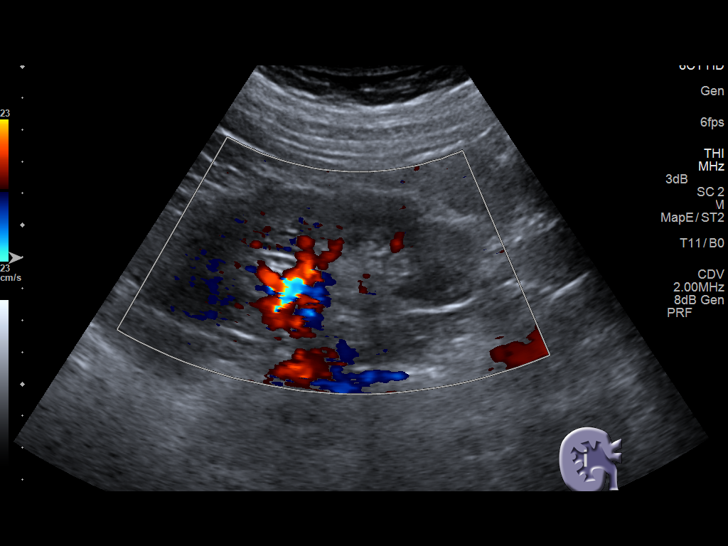
[im 77/93]
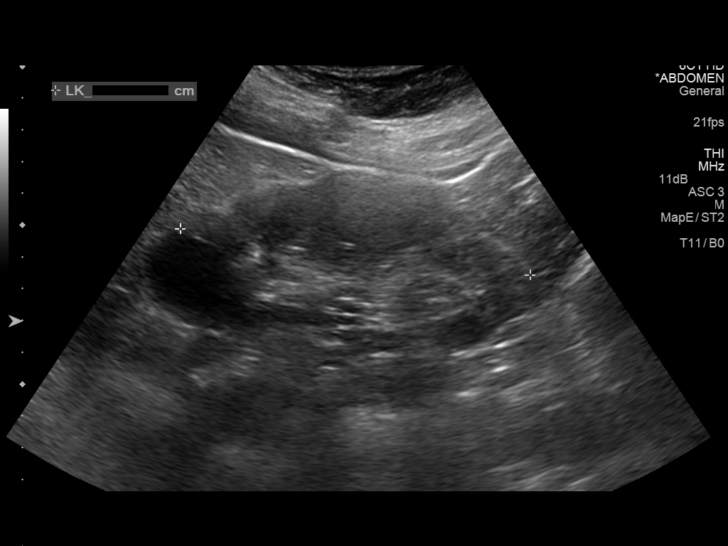
[im 85/93]
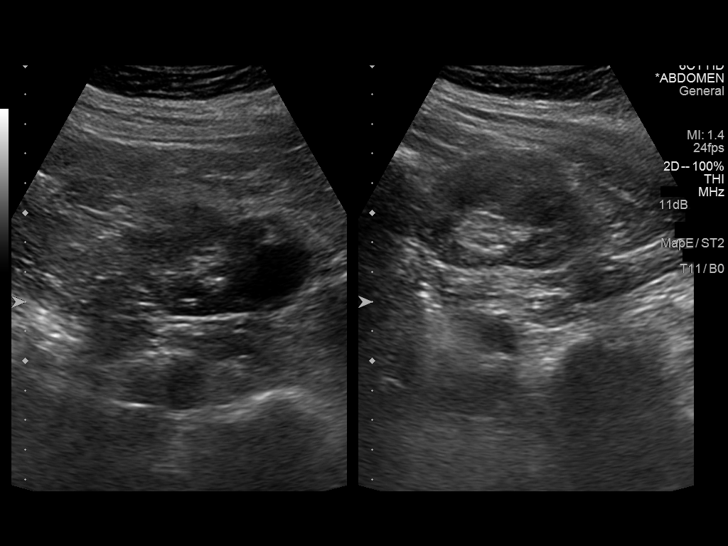
[im 93/93]
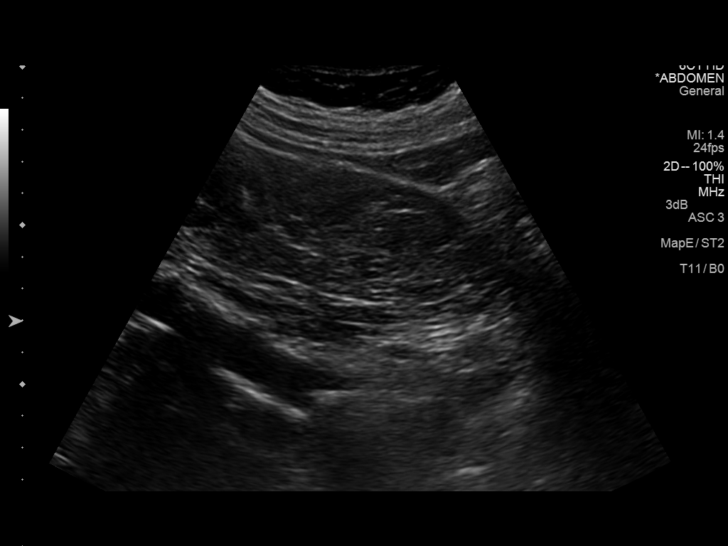

[13 of 25 positions shown; findings below may reference images not displayed]

FINDINGS: Gallbladder: Normally distended without stones or wall thickening.
No pericholecystic fluid or sonographic Murphy sign.

Common bile duct: Diameter: 3 mm diameter, normal

Liver: Normal appearance

IVC: Normal appearance

Pancreas: 3.2 x 2.1 x 3.3 cm hypoechoic nodule at head of pancreas,
corresponding to a fat attenuation mass/ lipoma on prior CT ; this
is been present since 5949 though slightly larger now

Spleen: Normal appearance, 6.1 cm length

Right Kidney: Length: 11.1 cm. Normal morphology without mass or
hydronephrosis.

Left Kidney: Length: 11.1 cm. Normal cortical thickness and
echogenicity. Cyst at upper pole 2.3 x 2.1 x 2.4 cm. No solid mass
or hydronephrosis.

Abdominal aorta: Normal caliber

Other findings: No free fluid
IMPRESSION: Mass at head of pancreas measuring 3.2 x 2.1 x 3.3 cm, corresponding
to lipoma on prior CT, slightly increased in size since 9703.

Small LEFT renal cyst.

Otherwise negative exam.
